# Patient Record
Sex: Female | Born: 1990 | Race: Black or African American | Hispanic: No | Marital: Single | Smoking: Never smoker
Health system: Southern US, Community
[De-identification: ages and names within clinical notes are randomized; demographics above are authoritative.]

## PROBLEM LIST (undated history)

## (undated) ENCOUNTER — Inpatient Hospital Stay (HOSPITAL_COMMUNITY): Payer: Self-pay

## (undated) DIAGNOSIS — A599 Trichomoniasis, unspecified: Secondary | ICD-10-CM

## (undated) DIAGNOSIS — J40 Bronchitis, not specified as acute or chronic: Secondary | ICD-10-CM

## (undated) DIAGNOSIS — O09299 Supervision of pregnancy with other poor reproductive or obstetric history, unspecified trimester: Secondary | ICD-10-CM

## (undated) HISTORY — PX: THERAPEUTIC ABORTION: SHX798

## (undated) HISTORY — PX: NO PAST SURGERIES: SHX2092

---
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
## 2008-04-29 ENCOUNTER — Emergency Department (HOSPITAL_COMMUNITY): Admission: EM | Admit: 2008-04-29 | Discharge: 2008-04-29 | Payer: Self-pay | Admitting: Emergency Medicine

## 2008-08-04 ENCOUNTER — Emergency Department (HOSPITAL_COMMUNITY): Admission: EM | Admit: 2008-08-04 | Discharge: 2008-08-04 | Payer: Self-pay | Admitting: Family Medicine

## 2008-08-17 ENCOUNTER — Inpatient Hospital Stay (HOSPITAL_COMMUNITY): Admission: AD | Admit: 2008-08-17 | Discharge: 2008-08-17 | Payer: Self-pay | Admitting: Family Medicine

## 2008-09-15 ENCOUNTER — Emergency Department (HOSPITAL_COMMUNITY): Admission: EM | Admit: 2008-09-15 | Discharge: 2008-09-15 | Payer: Self-pay | Admitting: Emergency Medicine

## 2008-11-25 ENCOUNTER — Inpatient Hospital Stay (HOSPITAL_COMMUNITY): Admission: AD | Admit: 2008-11-25 | Discharge: 2008-11-27 | Payer: Self-pay | Admitting: Obstetrics & Gynecology

## 2008-11-25 ENCOUNTER — Ambulatory Visit: Payer: Self-pay | Admitting: Family

## 2008-11-26 ENCOUNTER — Encounter: Payer: Self-pay | Admitting: Obstetrics & Gynecology

## 2008-12-07 ENCOUNTER — Ambulatory Visit: Payer: Self-pay | Admitting: Family Medicine

## 2008-12-10 ENCOUNTER — Ambulatory Visit (HOSPITAL_COMMUNITY): Admission: RE | Admit: 2008-12-10 | Discharge: 2008-12-10 | Payer: Self-pay | Admitting: Family Medicine

## 2008-12-14 ENCOUNTER — Ambulatory Visit: Payer: Self-pay | Admitting: Physician Assistant

## 2008-12-14 ENCOUNTER — Encounter: Payer: Self-pay | Admitting: Obstetrics & Gynecology

## 2008-12-14 ENCOUNTER — Inpatient Hospital Stay (HOSPITAL_COMMUNITY): Admission: AD | Admit: 2008-12-14 | Discharge: 2008-12-16 | Payer: Self-pay | Admitting: Obstetrics & Gynecology

## 2008-12-14 ENCOUNTER — Ambulatory Visit: Payer: Self-pay | Admitting: Obstetrics & Gynecology

## 2008-12-28 ENCOUNTER — Ambulatory Visit: Payer: Self-pay | Admitting: Obstetrics & Gynecology

## 2008-12-29 ENCOUNTER — Ambulatory Visit (HOSPITAL_COMMUNITY): Admission: RE | Admit: 2008-12-29 | Discharge: 2008-12-29 | Payer: Self-pay | Admitting: Family Medicine

## 2009-01-01 ENCOUNTER — Inpatient Hospital Stay (HOSPITAL_COMMUNITY): Admission: AD | Admit: 2009-01-01 | Discharge: 2009-01-01 | Payer: Self-pay | Admitting: Obstetrics & Gynecology

## 2009-01-04 ENCOUNTER — Ambulatory Visit: Payer: Self-pay | Admitting: Obstetrics & Gynecology

## 2009-01-18 ENCOUNTER — Ambulatory Visit: Payer: Self-pay | Admitting: Obstetrics & Gynecology

## 2009-01-18 LAB — CONVERTED CEMR LAB
Chlamydia, DNA Probe: NEGATIVE
GC Probe Amp, Genital: NEGATIVE

## 2009-01-24 ENCOUNTER — Ambulatory Visit: Payer: Self-pay | Admitting: Obstetrics and Gynecology

## 2009-01-24 ENCOUNTER — Inpatient Hospital Stay (HOSPITAL_COMMUNITY): Admission: AD | Admit: 2009-01-24 | Discharge: 2009-01-24 | Payer: Self-pay | Admitting: Obstetrics & Gynecology

## 2009-02-01 ENCOUNTER — Ambulatory Visit: Payer: Self-pay | Admitting: Family Medicine

## 2009-02-08 ENCOUNTER — Ambulatory Visit: Payer: Self-pay | Admitting: Obstetrics & Gynecology

## 2009-02-15 ENCOUNTER — Ambulatory Visit: Payer: Self-pay | Admitting: Obstetrics & Gynecology

## 2009-02-17 ENCOUNTER — Ambulatory Visit: Payer: Self-pay | Admitting: Family

## 2009-02-17 ENCOUNTER — Inpatient Hospital Stay (HOSPITAL_COMMUNITY): Admission: AD | Admit: 2009-02-17 | Discharge: 2009-02-21 | Payer: Self-pay | Admitting: Obstetrics and Gynecology

## 2009-04-08 ENCOUNTER — Ambulatory Visit: Payer: Self-pay | Admitting: Obstetrics and Gynecology

## 2009-04-20 ENCOUNTER — Emergency Department (HOSPITAL_COMMUNITY): Admission: EM | Admit: 2009-04-20 | Discharge: 2009-04-21 | Payer: Self-pay | Admitting: Emergency Medicine

## 2009-10-31 ENCOUNTER — Ambulatory Visit: Payer: Self-pay | Admitting: Obstetrics and Gynecology

## 2009-10-31 ENCOUNTER — Inpatient Hospital Stay (HOSPITAL_COMMUNITY): Admission: AD | Admit: 2009-10-31 | Discharge: 2009-10-31 | Payer: Self-pay | Admitting: Obstetrics and Gynecology

## 2009-12-25 ENCOUNTER — Emergency Department (HOSPITAL_COMMUNITY): Admission: EM | Admit: 2009-12-25 | Discharge: 2009-12-25 | Payer: Self-pay | Admitting: Emergency Medicine

## 2010-01-10 ENCOUNTER — Inpatient Hospital Stay (HOSPITAL_COMMUNITY): Admission: AD | Admit: 2010-01-10 | Discharge: 2010-01-10 | Payer: Self-pay | Admitting: Obstetrics and Gynecology

## 2010-02-09 ENCOUNTER — Ambulatory Visit: Payer: Self-pay | Admitting: Obstetrics and Gynecology

## 2010-02-09 LAB — CONVERTED CEMR LAB
Antibody Screen: NEGATIVE
Basophils Absolute: 0 10*3/uL (ref 0.0–0.1)
Basophils Relative: 0 % (ref 0–1)
Chlamydia, DNA Probe: NEGATIVE
Eosinophils Absolute: 0.1 10*3/uL (ref 0.0–0.7)
Eosinophils Relative: 1 % (ref 0–5)
GC Probe Amp, Genital: NEGATIVE
HCT: 36.6 % (ref 36.0–46.0)
Hemoglobin: 12.3 g/dL (ref 12.0–15.0)
Hepatitis B Surface Ag: NEGATIVE
Hgb A2 Quant: 2.6 % (ref 2.2–3.2)
Hgb A: 97.4 % (ref 96.8–97.8)
Hgb F Quant: 0 % (ref 0.0–2.0)
Hgb S Quant: 0 % (ref 0.0–0.0)
Lymphocytes Relative: 28 % (ref 12–46)
Lymphs Abs: 2 10*3/uL (ref 0.7–4.0)
MCHC: 33.6 g/dL (ref 30.0–36.0)
MCV: 79.7 fL (ref 78.0–100.0)
Monocytes Absolute: 0.5 10*3/uL (ref 0.1–1.0)
Monocytes Relative: 7 % (ref 3–12)
Neutro Abs: 4.7 10*3/uL (ref 1.7–7.7)
Neutrophils Relative %: 65 % (ref 43–77)
Platelets: 192 10*3/uL (ref 150–400)
RBC: 4.59 M/uL (ref 3.87–5.11)
RDW: 14 % (ref 11.5–15.5)
Rh Type: POSITIVE
Rubella: 44.2 intl units/mL — ABNORMAL HIGH
WBC: 7.3 10*3/uL (ref 4.0–10.5)

## 2010-02-15 ENCOUNTER — Ambulatory Visit (HOSPITAL_COMMUNITY): Admission: RE | Admit: 2010-02-15 | Discharge: 2010-02-15 | Payer: Self-pay | Admitting: Obstetrics & Gynecology

## 2010-02-23 ENCOUNTER — Ambulatory Visit (HOSPITAL_COMMUNITY): Admission: RE | Admit: 2010-02-23 | Discharge: 2010-02-23 | Payer: Self-pay | Admitting: Obstetrics & Gynecology

## 2010-03-09 ENCOUNTER — Ambulatory Visit: Payer: Self-pay | Admitting: Obstetrics and Gynecology

## 2010-03-30 ENCOUNTER — Ambulatory Visit (HOSPITAL_COMMUNITY): Admission: RE | Admit: 2010-03-30 | Discharge: 2010-03-30 | Payer: Self-pay | Admitting: Obstetrics & Gynecology

## 2010-04-06 ENCOUNTER — Ambulatory Visit: Payer: Self-pay | Admitting: Obstetrics and Gynecology

## 2010-05-18 ENCOUNTER — Ambulatory Visit: Payer: Self-pay | Admitting: Obstetrics and Gynecology

## 2010-05-25 ENCOUNTER — Ambulatory Visit: Payer: Self-pay | Admitting: Obstetrics and Gynecology

## 2010-06-08 ENCOUNTER — Encounter (INDEPENDENT_AMBULATORY_CARE_PROVIDER_SITE_OTHER): Payer: Self-pay | Admitting: Family Medicine

## 2010-06-08 ENCOUNTER — Ambulatory Visit: Payer: Self-pay | Admitting: Obstetrics and Gynecology

## 2010-06-08 LAB — CONVERTED CEMR LAB
HCT: 33.4 % — ABNORMAL LOW (ref 36.0–46.0)
Hemoglobin: 11.2 g/dL — ABNORMAL LOW (ref 12.0–15.0)
MCHC: 33.5 g/dL (ref 30.0–36.0)
MCV: 78.2 fL (ref 78.0–100.0)
Platelets: 207 10*3/uL (ref 150–400)
RBC: 4.27 M/uL (ref 3.87–5.11)
RDW: 13.7 % (ref 11.5–15.5)
WBC: 8.2 10*3/uL (ref 4.0–10.5)

## 2010-06-22 ENCOUNTER — Ambulatory Visit: Payer: Self-pay | Admitting: Obstetrics and Gynecology

## 2010-06-22 ENCOUNTER — Encounter (INDEPENDENT_AMBULATORY_CARE_PROVIDER_SITE_OTHER): Payer: Self-pay | Admitting: Family Medicine

## 2010-07-06 ENCOUNTER — Ambulatory Visit: Payer: Self-pay | Admitting: Obstetrics and Gynecology

## 2010-07-13 ENCOUNTER — Ambulatory Visit: Payer: Self-pay | Admitting: Obstetrics and Gynecology

## 2010-07-13 ENCOUNTER — Encounter: Payer: Self-pay | Admitting: Physician Assistant

## 2010-07-14 ENCOUNTER — Encounter: Payer: Self-pay | Admitting: Physician Assistant

## 2010-07-14 LAB — CONVERTED CEMR LAB: Chlamydia, DNA Probe: NEGATIVE

## 2010-07-27 ENCOUNTER — Ambulatory Visit: Payer: Self-pay | Admitting: Obstetrics and Gynecology

## 2010-08-03 ENCOUNTER — Ambulatory Visit: Payer: Self-pay | Admitting: Obstetrics and Gynecology

## 2010-08-10 ENCOUNTER — Ambulatory Visit: Payer: Self-pay | Admitting: Obstetrics & Gynecology

## 2010-08-11 ENCOUNTER — Inpatient Hospital Stay (HOSPITAL_COMMUNITY): Admission: AD | Admit: 2010-08-11 | Discharge: 2010-08-14 | Payer: Self-pay | Admitting: Obstetrics & Gynecology

## 2010-08-11 ENCOUNTER — Ambulatory Visit: Payer: Self-pay | Admitting: Obstetrics & Gynecology

## 2010-11-27 ENCOUNTER — Encounter: Payer: Self-pay | Admitting: Obstetrics & Gynecology

## 2011-01-19 LAB — CBC
HCT: 38.5 % (ref 36.0–46.0)
Hemoglobin: 12.6 g/dL (ref 12.0–15.0)
MCH: 26.4 pg (ref 26.0–34.0)
MCHC: 32.9 g/dL (ref 30.0–36.0)
RBC: 4.8 MIL/uL (ref 3.87–5.11)

## 2011-01-19 LAB — POCT URINALYSIS DIPSTICK
Bilirubin Urine: NEGATIVE
Bilirubin Urine: NEGATIVE
Glucose, UA: NEGATIVE mg/dL
Hgb urine dipstick: NEGATIVE
Hgb urine dipstick: NEGATIVE
Hgb urine dipstick: NEGATIVE
Ketones, ur: NEGATIVE mg/dL
Ketones, ur: NEGATIVE mg/dL
Nitrite: NEGATIVE
Protein, ur: NEGATIVE mg/dL
Specific Gravity, Urine: 1.02 (ref 1.005–1.030)
Specific Gravity, Urine: 1.02 (ref 1.005–1.030)
Urobilinogen, UA: 1 mg/dL (ref 0.0–1.0)
pH: 5.5 (ref 5.0–8.0)
pH: 6.5 (ref 5.0–8.0)

## 2011-01-20 LAB — POCT URINALYSIS DIPSTICK
Bilirubin Urine: NEGATIVE
Bilirubin Urine: NEGATIVE
Glucose, UA: NEGATIVE mg/dL
Hgb urine dipstick: NEGATIVE
Hgb urine dipstick: NEGATIVE
Ketones, ur: NEGATIVE mg/dL
Ketones, ur: NEGATIVE mg/dL
Nitrite: NEGATIVE
Protein, ur: NEGATIVE mg/dL
Protein, ur: NEGATIVE mg/dL
Specific Gravity, Urine: <=1.005 (ref 1.005–1.030)
Specific Gravity, Urine: <=1.005 (ref 1.005–1.030)
Urobilinogen, UA: 0.2 mg/dL (ref 0.0–1.0)
pH: 5.5 (ref 5.0–8.0)
pH: 6.5 (ref 5.0–8.0)

## 2011-01-21 LAB — POCT URINALYSIS DIP (DEVICE)
Bilirubin Urine: NEGATIVE
Ketones, ur: NEGATIVE mg/dL
pH: 5 (ref 5.0–8.0)

## 2011-01-22 LAB — POCT URINALYSIS DIP (DEVICE)
Bilirubin Urine: NEGATIVE
Glucose, UA: NEGATIVE mg/dL
Ketones, ur: NEGATIVE mg/dL
Protein, ur: 30 mg/dL — AB
Specific Gravity, Urine: 1.025 (ref 1.005–1.030)

## 2011-01-23 LAB — POCT URINALYSIS DIP (DEVICE)
Glucose, UA: NEGATIVE mg/dL
Hgb urine dipstick: NEGATIVE
Ketones, ur: NEGATIVE mg/dL
Specific Gravity, Urine: >=1.03 (ref 1.005–1.030)

## 2011-01-24 LAB — POCT URINALYSIS DIP (DEVICE)
Bilirubin Urine: NEGATIVE
Hgb urine dipstick: NEGATIVE
Ketones, ur: NEGATIVE mg/dL
Specific Gravity, Urine: 1.025 (ref 1.005–1.030)
pH: 6.5 (ref 5.0–8.0)

## 2011-01-25 LAB — POCT I-STAT, CHEM 8
Calcium, Ion: 1.16 mmol/L (ref 1.12–1.32)
Chloride: 105 mEq/L (ref 96–112)
Glucose, Bld: 88 mg/dL (ref 70–99)
HCT: 38 % (ref 36.0–46.0)
Hemoglobin: 12.9 g/dL (ref 12.0–15.0)
Potassium: 3.7 mEq/L (ref 3.5–5.1)

## 2011-01-25 LAB — URINALYSIS, ROUTINE W REFLEX MICROSCOPIC
Bilirubin Urine: NEGATIVE
Hgb urine dipstick: NEGATIVE
Specific Gravity, Urine: 1.023 (ref 1.005–1.030)
pH: 7 (ref 5.0–8.0)

## 2011-01-25 LAB — POCT URINALYSIS DIP (DEVICE)
Glucose, UA: NEGATIVE mg/dL
Protein, ur: NEGATIVE mg/dL
Specific Gravity, Urine: >1.03 — ABNORMAL HIGH (ref 1.005–1.030)

## 2011-01-25 LAB — POCT PREGNANCY, URINE: Preg Test, Ur: POSITIVE

## 2011-01-25 LAB — URINE MICROSCOPIC-ADD ON

## 2011-01-29 LAB — URINALYSIS, ROUTINE W REFLEX MICROSCOPIC
Bilirubin Urine: NEGATIVE
Glucose, UA: NEGATIVE mg/dL
Hgb urine dipstick: NEGATIVE
Specific Gravity, Urine: 1.02 (ref 1.005–1.030)
pH: 6.5 (ref 5.0–8.0)

## 2011-01-29 LAB — GC/CHLAMYDIA PROBE AMP, GENITAL
Chlamydia, DNA Probe: NEGATIVE
GC Probe Amp, Genital: POSITIVE — AB

## 2011-01-29 LAB — WET PREP, GENITAL: Trich, Wet Prep: NONE SEEN

## 2011-02-13 LAB — URINE CULTURE: Colony Count: >=100000

## 2011-02-13 LAB — URINALYSIS, ROUTINE W REFLEX MICROSCOPIC
Bilirubin Urine: NEGATIVE
Protein, ur: 100 mg/dL — AB
Urobilinogen, UA: 1 mg/dL (ref 0.0–1.0)

## 2011-02-13 LAB — URINE MICROSCOPIC-ADD ON

## 2011-02-15 LAB — POCT URINALYSIS DIP (DEVICE)
Bilirubin Urine: NEGATIVE
Glucose, UA: NEGATIVE mg/dL
Hgb urine dipstick: NEGATIVE
Hgb urine dipstick: NEGATIVE
Ketones, ur: NEGATIVE mg/dL
Nitrite: NEGATIVE
Specific Gravity, Urine: 1.01 (ref 1.005–1.030)
Specific Gravity, Urine: 1.01 (ref 1.005–1.030)
Urobilinogen, UA: 0.2 mg/dL (ref 0.0–1.0)
pH: 6.5 (ref 5.0–8.0)

## 2011-02-15 LAB — COMPREHENSIVE METABOLIC PANEL
AST: 24 U/L (ref 0–37)
Albumin: 2.9 g/dL — ABNORMAL LOW (ref 3.5–5.2)
BUN: 3 mg/dL — ABNORMAL LOW (ref 6–23)
Calcium: 9.1 mg/dL (ref 8.4–10.5)
Creatinine, Ser: 0.65 mg/dL (ref 0.4–1.2)
GFR calc Af Amer: >60 mL/min (ref >60)

## 2011-02-15 LAB — CBC
HCT: 36.5 % (ref 36.0–46.0)
HCT: 38.7 % (ref 36.0–46.0)
Hemoglobin: 12.3 g/dL (ref 12.0–15.0)
MCHC: 33 g/dL (ref 30.0–36.0)
MCHC: 33.8 g/dL (ref 30.0–36.0)
MCV: 81 fL (ref 78.0–100.0)
MCV: 83 fL (ref 78.0–100.0)
Platelets: 208 10*3/uL (ref 150–400)
RDW: 13.8 % (ref 11.5–15.5)
RDW: 13.8 % (ref 11.5–15.5)
WBC: 12.8 10*3/uL — ABNORMAL HIGH (ref 4.0–10.5)

## 2011-02-16 LAB — POCT URINALYSIS DIP (DEVICE)
Bilirubin Urine: NEGATIVE
Bilirubin Urine: NEGATIVE
Glucose, UA: NEGATIVE mg/dL
Glucose, UA: NEGATIVE mg/dL
Hgb urine dipstick: NEGATIVE
Hgb urine dipstick: NEGATIVE
Ketones, ur: NEGATIVE mg/dL
Ketones, ur: NEGATIVE mg/dL
Nitrite: NEGATIVE
Protein, ur: NEGATIVE mg/dL
Protein, ur: NEGATIVE mg/dL
Specific Gravity, Urine: 1.01 (ref 1.005–1.030)
Specific Gravity, Urine: 1.01 (ref 1.005–1.030)
Specific Gravity, Urine: <=1.005 (ref 1.005–1.030)
Urobilinogen, UA: 0.2 mg/dL (ref 0.0–1.0)
pH: 6 (ref 5.0–8.0)
pH: 6.5 (ref 5.0–8.0)

## 2011-02-20 LAB — URINALYSIS, ROUTINE W REFLEX MICROSCOPIC
Bilirubin Urine: NEGATIVE
Ketones, ur: 15 mg/dL — AB
Nitrite: NEGATIVE
Protein, ur: NEGATIVE mg/dL
pH: 6.5 (ref 5.0–8.0)

## 2011-02-20 LAB — CBC
Hemoglobin: 11.7 g/dL — ABNORMAL LOW (ref 12.0–15.0)
MCHC: 33.5 g/dL (ref 30.0–36.0)
MCV: 83.3 fL (ref 78.0–100.0)
RBC: 4.21 MIL/uL (ref 3.87–5.11)
RDW: 13.1 % (ref 11.5–15.5)

## 2011-02-20 LAB — DIFFERENTIAL
Basophils Absolute: 0 10*3/uL (ref 0.0–0.1)
Basophils Relative: 0 % (ref 0–1)
Eosinophils Absolute: 0.2 10*3/uL (ref 0.0–0.7)
Eosinophils Relative: 2 % (ref 0–5)
Monocytes Absolute: 0.5 10*3/uL (ref 0.1–1.0)
Monocytes Relative: 5 % (ref 3–12)

## 2011-02-20 LAB — RAPID URINE DRUG SCREEN, HOSP PERFORMED
Benzodiazepines: NOT DETECTED
Cocaine: NOT DETECTED
Tetrahydrocannabinol: NOT DETECTED

## 2011-02-20 LAB — SICKLE CELL SCREEN: Sickle Cell Screen: NEGATIVE

## 2011-02-20 LAB — WET PREP, GENITAL
Trich, Wet Prep: NONE SEEN
Yeast Wet Prep HPF POC: NONE SEEN

## 2011-02-20 LAB — HIV ANTIBODY (ROUTINE TESTING W REFLEX): HIV: NONREACTIVE

## 2011-02-20 LAB — TYPE AND SCREEN

## 2011-02-20 LAB — ABO/RH: ABO/RH(D): B POS

## 2011-02-21 LAB — POCT URINALYSIS DIP (DEVICE)
Bilirubin Urine: NEGATIVE
Bilirubin Urine: NEGATIVE
Glucose, UA: NEGATIVE mg/dL
Hgb urine dipstick: NEGATIVE
Hgb urine dipstick: NEGATIVE
Hgb urine dipstick: NEGATIVE
Ketones, ur: NEGATIVE mg/dL
Ketones, ur: NEGATIVE mg/dL
Protein, ur: NEGATIVE mg/dL
Protein, ur: NEGATIVE mg/dL
Specific Gravity, Urine: 1.01 (ref 1.005–1.030)
Specific Gravity, Urine: 1.015 (ref 1.005–1.030)
Specific Gravity, Urine: 1.015 (ref 1.005–1.030)
pH: 5.5 (ref 5.0–8.0)
pH: 6 (ref 5.0–8.0)
pH: 7 (ref 5.0–8.0)

## 2011-02-21 LAB — WET PREP, GENITAL: Clue Cells Wet Prep HPF POC: NONE SEEN

## 2011-02-21 LAB — FETAL FIBRONECTIN: Fetal Fibronectin: NEGATIVE

## 2011-02-21 LAB — STREP B DNA PROBE: Strep Group B Ag: NEGATIVE

## 2011-03-21 NOTE — Discharge Summary (Signed)
Amanda Murillo, Amanda Murillo                ACCOUNT NO.:  [id number]   MEDICAL RECORD NO.:  [id number]           PATIENT TYPE:   LOCATION:                                 FACILITY:   PHYSICIAN:  Norton Blizzard, MD    DATE OF BIRTH:  1991/01/13   DATE OF ADMISSION:  02/17/2009  DATE OF DISCHARGE:  02/21/2009                               DISCHARGE SUMMARY   ADMISSION DIAGNOSES:  1. Intrauterine pregnancy at 72 and 1/7 weeks' gestation.  2. Fetus with known Trisomy 18.  3. Induction of labor for post dates.   DISCHARGE DIAGNOSIS:  Status post vaginal delivery of nonviable trisomy  18 fetus.   PERTINENT LABORATORIES:  B positive.  Antibody negative.  Rubella  immune.  Hepatitis B surface antigen negative.  Syphilis nonreactive.  HIV nonreactive.  GC negative.  Chlamydia negative.  Sickle cell screen  negative.  One-hour GTT was 124.  GBS negative.  Post-delivery  hemoglobin 12.8, hematocrit of 38.7, platelet count 208.   BRIEF HOSPITAL COURSE:  The patient is a 20 year old gravida 1, para 0  who presented at 102 and 1/7 weeks' gestation for induction of labor for  post dates in the setting of a known Trisomy 18 fetus.  The patient's  diagnosis was made via amniocentesis and subsequent FISH analysis in  late second trimester.  She was followed in the [facility] and was  extensively counseled about the prognosis of having a fetus with Trisomy  42.  The patient did not go into spontaneous labor, and was induced at  postdates as per protocol.  Her induction proceeded with Cytotec, Foley  bulb, and Pitocin, and the patient had no complications during her  induction of labor.  Of note, there was no fetal monitoring given the  Trisomy 18 diagnosis.  She was seen by a Child psychotherapist and a Orthoptist  during her induction.  The patient went on to have a delivery of a  nonviable fetus on April 16 at  0121.  Female infant, Apgars of 0 and 0,  and weight was 1975 g.  Estimated blood loss was about 350  mL, and the  placenta was noted to be calcified.  After delivery, the patient was  initially noted to be grieving appropriately, but then she was  persistent in having to see her nonviable infant over and over again.  She was also increasingly withdrawn and depressed.  The patient was  started on Zoloft given that she was exhibiting signs of postpartum  depression.  She was also started on Xanax for anxiety.  An arrangement  was made with the morgue that she will be able to come back into the  hospital just to see the infant.  The patient did make plans with the  help of the social work consult for funeral plans for the fetus.  At the  time of discharge, the patient was physically okay: her bleeding is  minimal, abdomen is soft with a firm fundus, extremities are nontender,  but she is having a hard time dealing with her fetal demise.   DISCHARGE  CONDITION:  Good.   DISCHARGE MEDICATIONS:  1. Ibuprofen 600 mg p.o. q.6 h. p.r.n. pain.  2. Xanax 0.25 mg p.o. t.i.d. p.r.n. anxiety.  3. Zoloft 50 mg p.o. daily.   DISCHARGE INSTRUCTIONS:  The patient was given the routine postpartum  discharge instructions and told to come to the [facility] for any concerns about heavy bleeding,  infection, worsening depression, suicidal/homicidal ideation or any  other concerns.   FOLLOWUP APPOINTMENTS:  The patient will be seen in the [facility] in the next 1-2 weeks given her circumstances and high risk for  continued postpartum depression.  If she is stable at this visit, she  will follow up in the health department for her routine postpartum check  at 6 weeks.      Norton Blizzard, MD  Electronically Signed     UAD/MEDQ  D:  02/21/2009  T:  02/21/2009  Job:  [id number]

## 2011-03-21 NOTE — Discharge Summary (Signed)
Amanda Murillo, Amanda Murillo                ACCOUNT NO.:  [id number]   MEDICAL RECORD NO.:  [id number]          PATIENT TYPE:  INP   LOCATION:  9158                          FACILITY:  [facility]   PHYSICIAN:  Scheryl Darter, MD       DATE OF BIRTH:  07-01-1991   DATE OF ADMISSION:  11/25/2008  DATE OF DISCHARGE:  11/27/2008                               DISCHARGE SUMMARY   DIAGNOSES:  1. Asthma exacerbation.  2. Intrauterine pregnancy [redacted] weeks gestation.   The patient is an 20 year old gravida 1, para 0, last menstrual period  May 05, 2008, estimated date of confinement February 09, 2009, who  presented at 29 weeks 1 day gestation with shortness of breath.  The  patient has had a history of asthma.  Her medications are albuterol  p.r.n.  She had a cough with sputum production.   PAST MEDICAL HISTORY:  Asthma.   MEDICATIONS:  Albuterol inhaler.   ALLERGIES:  No known drug allergies.   SOCIAL HISTORY:  The patient denies alcohol, tobacco, or drug use.  She  had no prenatal care.   PHYSICAL EXAMINATION:  VITAL SIGNS:  Height is 59 inches, weight 153  pounds, BP 121/95, temperature 97.3, pulse 88, respirations 20, and O2  sat was 98%.  GENERAL:  The patient in no acute distress.  HEART:  Regular rate and rhythm.  RESPIRATIONS:  She has labored respirations and wheezes are noted, which  are improved after 1 nebulizer treatment.  ABDOMEN:  Gravid and large for date.  She has no edema.  Fetal heart  rate was 135 with variability and no traction.   An ultrasound showed intrauterine pregnancy.  There was increased  aminotic fluid index of 31.27 and measurements consistent with 29 weeks'  and 2 days' gestation.  No fetal anomalies were seen.  She received Solu-  Medrol 80 mg IV q.6 h., which was changed to p.o. prednisone.  She  received albuterol nebulizer treatment.  On November 27, 2008, the  breathing was much improved.  She was discharged home with prednisone  taper for 10 days and prescription  for Advair inhaler twice a day.  She  receives her albuterol inhaler p.r.n.  She is to follow up in [facility] a week after discharge and she is to have a repeat ultrasound and  possibly amniocentesis in 2 weeks.      Scheryl Darter, MD     JA/MEDQ  D:  12/11/2008  T:  12/12/2008  Job:  [id number]

## 2011-03-21 NOTE — Group Therapy Note (Signed)
NAMEELLYSSA, ZAGAL                ACCOUNT NO.:  [id number]   MEDICAL RECORD NO.:  [id number]          PATIENT TYPE:  WOC   LOCATION:  [facility]                   [facility]AC[facility]  [facility]   PHYSICIAN:  Sid Falcon, CNM  DATE OF BIRTH:  [birth year]   DATE OF SERVICE:                                  CLINIC NOTE   SUBJECTIVE:  Six-week postpartum followup; however, there was a note  stating postpartum depression.  When asked, the patient denies  postpartum depression.  No complaints of sadness, no thoughts of  suicide, no thoughts of harming others.  Stated it was reported by the  [facility] nurse that she should be evaluated for postpartum depression.  Had a trisomy 76 birth in April.  Reports dealing with grief  appropriately and declines any counseling help.  Does desire NuvaRing  for birth control method.  Denies sexual activity since November 2009.  No other questions or concerns.  Last menstrual period on Apr 02, 2009.   PHYSICAL EXAMINATION:  The patient is alert and oriented x3.  No  physical exam done.   ASSESSMENT:  A 6-week postpartum exam.   PLAN:  NuvaRing prescription.  Described the use, insert in vagina,  remove and replace with a new after menses.  The patient will follow up  if experiences signs of depression.  Pap smear not indicated due to the  patient's age.      Sid Falcon, CNM     WM/MEDQ  D:  04/08/2009  T:  04/08/2009  Job:  [id number]

## 2011-03-21 NOTE — Discharge Summary (Signed)
Amanda Murillo, Amanda Murillo                ACCOUNT NO.:  [id number]   MEDICAL RECORD NO.:  [id number]          PATIENT TYPE:  INP   LOCATION:  9159                          [facility]ACILITY:  [facility]   PHYSICIAN:  Lazaro Arms, M.D.   DATE OF BIRTH:  09/05/1991   DATE OF ADMISSION:  12/14/2008  DATE OF DISCHARGE:  12/16/2008                               DISCHARGE SUMMARY    The patient was admitted on December 14, 2008, at 31 weeks 6 days with  preterm contractions.  The patient was discharged on December 16, 2008,  with the discharge diagnosis of intrauterine pregnancy at 32 weeks 1/7  days with known fetal anomaly and FISH positive for trisomy 25.  At the  time of admission, the patient was found to present to Maternity  Admission and was found to have preterm contractions.  She was admitted  for observation, IV hydration, and steroid betamethasone injections for  fetal lung maturity and nifedipine.  During the course of her hospital  stay, an ultrasound was scheduled and performed that revealed  abnormalities that were consistent with lethal diagnosis.  The patient  had a consult with Maternal Fetal Medicine during the course of her  hospital stay and amniocentesis was performed.  Amniocentesis results  were called to the attending for the faculty practice to confirm lethal  diagnosis of tri 18.  A decision was made to discontinue the Procardia  for preterm labor given her diagnosis of asthma and also a new diagnosis  of lethal fetal anomaly.  Multiple discussions were had with the family,  with the attending, midwife, and staff including chaplain, Maternal  Fetal Medicine concerning this diagnosis and options for continuation of  pregnancy and referral for Neonatal Palliative Care and Hospice.  On the  evening of February 10, the patient and her family did make a decision  that she should decide to go home.  She was in stable status.  Dr. Despina Hidden  again discussed the diagnosis of tri 18 and options at  length with the  patient and her family.  She was discharged home in stable status with  instructions to follow up in [facility] the following Monday.  She  was given a prescription for Ativan p.r.n. anxiety.  She was given labor  precautions and instructions to continue all home meds as prescribed for  her asthma.       Maylon Cos, C.N.M.      Lazaro Arms, M.D.  Electronically Signed    SS/MEDQ  D:  03/04/2009  T:  03/04/2009  Job:  [id number]

## 2011-05-02 ENCOUNTER — Emergency Department (HOSPITAL_COMMUNITY): Payer: Medicaid Other

## 2011-05-02 ENCOUNTER — Emergency Department (HOSPITAL_COMMUNITY)
Admission: EM | Admit: 2011-05-02 | Discharge: 2011-05-02 | Disposition: A | Discharge status: Home / Self Care | Payer: Medicaid Other | Attending: Emergency Medicine | Admitting: Emergency Medicine

## 2011-05-02 DIAGNOSIS — R0989 Other specified symptoms and signs involving the circulatory and respiratory systems: Secondary | ICD-10-CM | POA: Insufficient documentation

## 2011-05-02 DIAGNOSIS — R059 Cough, unspecified: Secondary | ICD-10-CM | POA: Insufficient documentation

## 2011-05-02 DIAGNOSIS — R0609 Other forms of dyspnea: Secondary | ICD-10-CM | POA: Insufficient documentation

## 2011-05-02 DIAGNOSIS — R05 Cough: Secondary | ICD-10-CM | POA: Insufficient documentation

## 2011-05-02 DIAGNOSIS — R079 Chest pain, unspecified: Secondary | ICD-10-CM | POA: Insufficient documentation

## 2011-05-02 DIAGNOSIS — R000 Tachycardia, unspecified: Secondary | ICD-10-CM | POA: Insufficient documentation

## 2011-05-02 DIAGNOSIS — J45901 Unspecified asthma with (acute) exacerbation: Secondary | ICD-10-CM | POA: Insufficient documentation

## 2011-08-01 ENCOUNTER — Emergency Department (HOSPITAL_COMMUNITY)
Admission: EM | Admit: 2011-08-01 | Discharge: 2011-08-01 | Disposition: A | Discharge status: Home / Self Care | Payer: Medicaid Other | Attending: Emergency Medicine | Admitting: Emergency Medicine

## 2011-08-01 DIAGNOSIS — Z79899 Other long term (current) drug therapy: Secondary | ICD-10-CM | POA: Insufficient documentation

## 2011-08-01 DIAGNOSIS — J45909 Unspecified asthma, uncomplicated: Secondary | ICD-10-CM | POA: Insufficient documentation

## 2011-08-01 LAB — POCT PREGNANCY, URINE: Preg Test, Ur: POSITIVE

## 2011-08-07 LAB — WET PREP, GENITAL
Trich, Wet Prep: NONE SEEN
Yeast Wet Prep HPF POC: NONE SEEN

## 2011-08-07 LAB — GC/CHLAMYDIA PROBE AMP, GENITAL: Chlamydia, DNA Probe: NEGATIVE

## 2011-08-07 LAB — POCT URINALYSIS DIP (DEVICE)
Ketones, ur: 15 — AB
Protein, ur: NEGATIVE
pH: 6

## 2011-08-08 LAB — URINE MICROSCOPIC-ADD ON

## 2011-08-08 LAB — URINALYSIS, ROUTINE W REFLEX MICROSCOPIC
Bilirubin Urine: NEGATIVE
Glucose, UA: NEGATIVE
Ketones, ur: 15 — AB
Nitrite: NEGATIVE
Nitrite: NEGATIVE
Specific Gravity, Urine: 1.012
pH: 6
pH: 7

## 2011-08-22 ENCOUNTER — Inpatient Hospital Stay (HOSPITAL_COMMUNITY): Payer: Medicaid Other

## 2011-08-22 ENCOUNTER — Encounter (HOSPITAL_COMMUNITY): Payer: Self-pay

## 2011-08-22 ENCOUNTER — Inpatient Hospital Stay (HOSPITAL_COMMUNITY)
Admission: AD | Admit: 2011-08-22 | Discharge: 2011-08-22 | Disposition: A | Discharge status: Home / Self Care | Payer: Medicaid Other | Source: Ambulatory Visit | Attending: Obstetrics & Gynecology | Admitting: Obstetrics & Gynecology

## 2011-08-22 DIAGNOSIS — O26899 Other specified pregnancy related conditions, unspecified trimester: Secondary | ICD-10-CM

## 2011-08-22 DIAGNOSIS — O99891 Other specified diseases and conditions complicating pregnancy: Secondary | ICD-10-CM | POA: Insufficient documentation

## 2011-08-22 DIAGNOSIS — R109 Unspecified abdominal pain: Secondary | ICD-10-CM | POA: Insufficient documentation

## 2011-08-22 LAB — POCT PREGNANCY, URINE: Preg Test, Ur: POSITIVE

## 2011-08-22 NOTE — Progress Notes (Signed)
Pt states lmp-mid June, +FHT's heard, pt states white thin vaginal d/c, non-odorous, denies bleeding. No pain at present. Was here to confirm pregnancy.

## 2011-08-22 NOTE — ED Provider Notes (Signed)
History   Pt presents today for pregnancy confirmation. She states she is uncertain of how far along she is. She states she had some abd pain earlier, but none now. She denies vag dc, bleeding, or any other sx at this time.  Chief Complaint  Patient presents with  . Vaginal Discharge   HPI  OB History    Grav Para Term Preterm Abortions TAB SAB Ect Mult Living   3 2 2  0  0 0 0 0 1      Past Medical History  Diagnosis Date  . Asthma     History reviewed. No pertinent past surgical history.  No family history on file.  History  Substance Use Topics  . Smoking status: Never Smoker   . Smokeless tobacco: Never Used  . Alcohol Use: No    Allergies: Allergies not on file  No prescriptions prior to admission    Review of Systems  Constitutional: Negative for fever.  Cardiovascular: Negative for chest pain.  Gastrointestinal: Negative for nausea, vomiting, abdominal pain, diarrhea and constipation.  Genitourinary: Negative for dysuria, urgency, frequency and hematuria.  Neurological: Negative for dizziness and headaches.  Psychiatric/Behavioral: Negative for depression and suicidal ideas.   Physical Exam   Blood pressure 116/68, pulse 85, temperature 97.7 F (36.5 C), temperature source Oral, resp. rate 16, height 5' (1.524 m), weight 164 lb 6 oz (74.56 kg), last menstrual period 04/21/2011.  Physical Exam  Nursing note and vitals reviewed. Constitutional: She is oriented to person, place, and time. She appears well-developed and well-nourished. No distress.  HENT:  Head: Normocephalic and atraumatic.  Eyes: EOM are normal. Pupils are equal, round, and reactive to light.  GI: Soft. She exhibits no distension. There is no tenderness. There is no rebound and no guarding.  Neurological: She is alert and oriented to person, place, and time.  Skin: Skin is warm and dry. She is not diaphoretic.  Psychiatric: She has a normal mood and affect. Her behavior is normal.  Judgment and thought content normal.    MAU Course  Procedures  US shows single IUP at 22.6wks and NL cervical length. [facility] 12/20/11.  Assessment and Plan  Preg: discussed with pt at length. Discussed diet, activity, risks, and precautions.  Clinton Gallant. Jasenia Weilbacher III, DrHSc, MPAS, PA-C  08/22/2011, 1:27 PM   Henrietta Hoover, PA 08/22/11 1502

## 2011-08-24 NOTE — ED Provider Notes (Signed)
Attestation of Attending Supervision of Advanced Practitioner: Evaluation and management procedures were performed by the PA/NP/CNM/OB Fellow under my supervision/collaboration. Chart reviewed and agree with management and plan.  Samyrah Bruster A 08/24/2011 2:49 PM   

## 2011-10-07 ENCOUNTER — Encounter (HOSPITAL_COMMUNITY): Payer: Self-pay | Admitting: *Deleted

## 2011-10-07 ENCOUNTER — Inpatient Hospital Stay (HOSPITAL_COMMUNITY)
Admission: AD | Admit: 2011-10-07 | Discharge: 2011-10-07 | Disposition: A | Discharge status: Home / Self Care | Payer: Medicaid Other | Source: Ambulatory Visit | Attending: Obstetrics and Gynecology | Admitting: Obstetrics and Gynecology

## 2011-10-07 DIAGNOSIS — R109 Unspecified abdominal pain: Secondary | ICD-10-CM | POA: Insufficient documentation

## 2011-10-07 DIAGNOSIS — M5430 Sciatica, unspecified side: Secondary | ICD-10-CM

## 2011-10-07 DIAGNOSIS — M545 Low back pain, unspecified: Secondary | ICD-10-CM | POA: Insufficient documentation

## 2011-10-07 DIAGNOSIS — O99891 Other specified diseases and conditions complicating pregnancy: Secondary | ICD-10-CM | POA: Insufficient documentation

## 2011-10-07 HISTORY — DX: Trichomoniasis, unspecified: A59.9

## 2011-10-07 HISTORY — DX: Supervision of pregnancy with other poor reproductive or obstetric history, unspecified trimester: O09.299

## 2011-10-07 LAB — URINALYSIS, ROUTINE W REFLEX MICROSCOPIC
Bilirubin Urine: NEGATIVE
Glucose, UA: NEGATIVE mg/dL
Ketones, ur: 15 mg/dL — AB
Protein, ur: NEGATIVE mg/dL

## 2011-10-07 LAB — URINE MICROSCOPIC-ADD ON

## 2011-10-07 MED ORDER — ACETAMINOPHEN 500 MG PO TABS: 500.0000 mg | ORAL_TABLET | Freq: Four times a day (QID) | ORAL | Status: AC | PRN

## 2011-10-07 NOTE — ED Provider Notes (Signed)
Supervised by me. Agree with assessment. Will refer to HR clinic

## 2011-10-07 NOTE — Progress Notes (Signed)
Patient states she has had no prenatal care. Has been trying to get into the [facility] because she had a previous full term stillbirth with trisomy 22 with her first pregnancy. Has been having abdominal and leg pain for "months". No leaking or bleeding and reports good fetal movement.

## 2011-10-07 NOTE — ED Provider Notes (Signed)
History     Chief Complaint  Patient presents with  . Abdominal Pain   HPI  Patient presents to MAU with CC: "legs giving out."  She states this has been going on for a few months.  Describes it as a shooting/burning sensation that starts at her hips and radiates to her thighs and knees.  Pain is moderate.  At times, she has to hold on to something so she does not fall.  Denies any falls.  Pain is constant throughout the day - worse at night when she tries to lay on her back to sleep.  Has not tried any OTC medications or heating pads at home.  Patient does not have an OB provider - she has not had any prenatal care for this current pregnancy.  She is waiting on Medicaid approval and a referral to [facility].  OB History    Grav Para Term Preterm Abortions TAB SAB Ect Mult Living   3 2 2  0  0 0 0 0 1      Past Medical History  Diagnosis Date  . Asthma   . 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 Trichomonas infection     Past Surgical History  Procedure Date  . No past surgeries     History reviewed. No pertinent family history.  History  Substance Use Topics  . Smoking status: Never Smoker   . Smokeless tobacco: Never Used  . Alcohol Use: No    Allergies: No Known Allergies  Prescriptions prior to admission  Medication Sig Dispense Refill  . prenatal vitamin w/FE, FA (PRENATAL 1 + 1) 27-1 MG TABS Take 1 tablet by mouth daily.        . ALBUTEROL SULFATE HFA IN Inhale 2 puffs into the lungs daily as needed. For shortness of breath.         ROS  Denies fever, chills, sweats, urinary or bowel incontinence.  Denies any rash or signs of abscess of infection.  Able to ambulate.  Physical Exam   Blood pressure 115/67, pulse 76, temperature 98.4 F (36.9 C), temperature source Oral, resp. rate 20, height 5' (1.524 m), weight 73.301 kg (161 lb 9.6 oz), last menstrual period 04/21/2011, SpO2 98.00%, unknown if currently breastfeeding.  Physical Exam    Constitutional: She is oriented to person, place, and time. No distress.  HENT:  Head: Normocephalic and atraumatic.  Neck: Normal range of motion. Neck supple.  Cardiovascular: Normal rate, regular rhythm, normal heart sounds and intact distal pulses.  Exam reveals no gallop and no friction rub.   No murmur heard. Respiratory: Effort normal and breath sounds normal. She has no wheezes. She has no rales. She exhibits no tenderness.  GI: Soft. Bowel sounds are normal. She exhibits no distension. There is no tenderness.       Gravid  Musculoskeletal: Normal range of motion. She exhibits no edema and no tenderness.  Neurological: She is alert and oriented to person, place, and time. No cranial nerve deficit or sensory deficit. Coordination normal.       4/4 strength bilateral lower extremities and 4/4 strength upper extremities, possibly secondary to poor effort  Skin: Skin is dry.       Dry, round papular lesions on bilateral arms  Psychiatric: She has a normal mood and affect.    MAU Course  Procedures NST  Assessment and Plan  1.  Lower extremity pain: likely secondary to sciatica vs. Low back pain in  pregnancy.  Discussed treatment options with patient.  Will give Rx Tylenol 500 mg PRN pain.  Will give handout with home care instructions.  Recommended RICE for 2-3 days, heating pads to affected areas, followed by increased physical activity and exercise. 2. Supervision of pregnancy:  Patient needs prenatal care especially with a hx of trisomy 18 in previous pregnancy.  Will send a note to Admin team to schedule appointment at [facility] as soon as possible.  FHT were reassuring in MAU.  Patient denied any vaginal bleeding, discharge, or decreased fetal movement.  Kick counts and preterm labor precautions reviewed. 3. Reassuring fetal heart tones 4. Disposition: Will discharge home today with follow up at [facility]  DE LA CRUZ,Stevens Magwood 10/07/2011, 5:44 PM

## 2011-11-07 NOTE — L&D Delivery Note (Cosign Needed)
Delivery Note At 12:28 AM a viable female was delivered via Vaginal, Spontaneous Delivery (Presentation: Right Occiput Posterior) with loose nuchal cord x1 reduced without difficulty.  APGAR:8 ,9; weight 5 lb 14 oz (2665 g).   Placenta status: Intact, Spontaneous.  Cord: 3 vessels with the following complications: None.  Cord pH: not indicated.  Anesthesia: Epidural  Episiotomy: None Lacerations: None Suture Repair: n/a Est. Blood Loss (mL): 250 ml  Mom to postpartum.  Baby to nursery-stable.  LEFTWICH-KIRBY, Latori Beggs 12/14/2011, 12:46 AM

## 2011-12-06 ENCOUNTER — Ambulatory Visit (INDEPENDENT_AMBULATORY_CARE_PROVIDER_SITE_OTHER): Payer: Medicaid Other | Admitting: Advanced Practice Midwife

## 2011-12-06 VITALS — BP 117/76 | Temp 97.1°F | Wt 164.5 lb

## 2011-12-06 DIAGNOSIS — O0930 Supervision of pregnancy with insufficient antenatal care, unspecified trimester: Secondary | ICD-10-CM | POA: Insufficient documentation

## 2011-12-06 DIAGNOSIS — O09299 Supervision of pregnancy with other poor reproductive or obstetric history, unspecified trimester: Secondary | ICD-10-CM | POA: Insufficient documentation

## 2011-12-06 DIAGNOSIS — O358XX0 Maternal care for other (suspected) fetal abnormality and damage, not applicable or unspecified: Secondary | ICD-10-CM

## 2011-12-06 DIAGNOSIS — O09629 Supervision of young multigravida, unspecified trimester: Secondary | ICD-10-CM

## 2011-12-06 LAB — POCT URINALYSIS DIP (DEVICE)
Hgb urine dipstick: NEGATIVE
Nitrite: NEGATIVE
Protein, ur: NEGATIVE mg/dL
pH: 6.5 (ref 5.0–8.0)

## 2011-12-06 NOTE — Progress Notes (Signed)
Seen in MAU at 22 wks and last week. States tried to go to HD without success. Has been having painful UCs. First US showed EIF >> will schedule followup at [facility] this week. Cultures done. [facility] per pt.

## 2011-12-06 NOTE — Patient Instructions (Signed)
Normal Labor and Delivery °Your caregiver must first be sure you are in labor. Signs of labor include: °· You may pass what is called "the mucus plug" before labor begins. This is a small amount of blood stained mucus.  °· Regular uterine contractions.  °· The time between contractions get closer together.  °· The discomfort and pain gradually gets more intense.  °· Pains are mostly located in the back.  °· Pains get worse when walking.  °· The cervix (the opening of the uterus becomes thinner (begins to efface) and opens up (dilates).  °Once you are in labor and admitted into the hospital or care center, your caregiver will do the following: °· A complete physical examination.  °· Check your vital signs (blood pressure, pulse, temperature and the fetal heart rate).  °· Do a vaginal examination (using a sterile glove and lubricant) to determine:  °· The position (presentation) of the baby (head [vertex] or buttock first).  °· The level (station) of the baby's head in the birth canal.  °· The effacement and dilatation of the cervix.  °· You may have your pubic hair shaved and be given an enema depending on your caregiver and the circumstance.  °· An electronic monitor is usually placed on your abdomen. The monitor follows the length and intensity of the contractions, as well as the baby's heart rate.  °· Usually, your caregiver will insert an IV in your arm with a bottle of sugar water. This is done as a precaution so that medications can be given to you quickly during labor or delivery.  °NORMAL LABOR AND DELIVERY IS DIVIDED UP INTO 3 STAGES: °First Stage °This is when regular contractions begin and the cervix begins to efface and dilate. This stage can last from 3 to 15 hours. The end of the first stage is when the cervix is 100% effaced and 10 centimeters dilated. Pain medications may be given by  °· Injection (morphine, demerol, etc.)  °· Regional anesthesia (spinal, caudal or epidural, anesthetics given in  different locations of the spine). Paracervical pain medication may be given, which is an injection of and anesthetic on each side of the cervix.  °A pregnant woman may request to have "Natural Childbirth" which is not to have any medications or anesthesia during her labor and delivery. °Second Stage °This is when the baby comes down through the birth canal (vagina) and is born. This can take 1 to 4 hours. As the baby's head comes down through the birth canal, you may feel like you are going to have a bowel movement. You will get the urge to bear down and push until the baby is delivered. As the baby's head is being delivered, the caregiver will decide if an episiotomy (a cut in the perineum and vagina area) is needed to prevent tearing of the tissue in this area. The episiotomy is sewn up after the delivery of the baby and placenta. Sometimes a mask with nitrous oxide is given for the mother to breath during the delivery of the baby to help if there is too much pain. The end of Stage 2 is when the baby is fully delivered. Then when the umbilical cord stops pulsating it is clamped and cut. °Third Stage °The third stage begins after the baby is completely delivered and ends after the placenta (afterbirth) is delivered. This usually takes 5 to 30 minutes. After the placenta is delivered, a medication is given either by intravenous or injection to help contract   the uterus and prevent bleeding. The third stage is not painful and pain medication is usually not necessary. If an episiotomy was done, it is repaired at this time. °After the delivery, the mother is watched and monitored closely for 1 to 2 hours to make sure there is no postpartum bleeding (hemorrhage). If there is a lot of bleeding, medication is given to contract the uterus and stop the bleeding. °Document Released: 08/01/2008 Document Revised: 07/05/2011 Document Reviewed: 08/01/2008 °[employer]® Patient Information ©2012 [employer]. °

## 2011-12-07 LAB — OBSTETRIC PANEL
Eosinophils Absolute: 0.2 10*3/uL (ref 0.0–0.7)
Hemoglobin: 11 g/dL — ABNORMAL LOW (ref 12.0–15.0)
Hepatitis B Surface Ag: NEGATIVE
Lymphocytes Relative: 25 % (ref 12–46)
Lymphs Abs: 2.1 10*3/uL (ref 0.7–4.0)
MCH: 24.2 pg — ABNORMAL LOW (ref 26.0–34.0)
Monocytes Relative: 7 % (ref 3–12)
Neutro Abs: 5.5 10*3/uL (ref 1.7–7.7)
Neutrophils Relative %: 66 % (ref 43–77)
RBC: 4.55 MIL/uL (ref 3.87–5.11)
Rh Type: POSITIVE
Rubella: 47 IU/mL — ABNORMAL HIGH
WBC: 8.5 10*3/uL (ref 4.0–10.5)

## 2011-12-07 LAB — GC/CHLAMYDIA PROBE AMP, GENITAL: GC Probe Amp, Genital: NEGATIVE

## 2011-12-07 LAB — GLUCOSE TOLERANCE, 1 HOUR: Glucose, 1 Hour GTT: 107 mg/dL (ref 70–140)

## 2011-12-08 ENCOUNTER — Ambulatory Visit (HOSPITAL_COMMUNITY)
Admission: RE | Admit: 2011-12-08 | Discharge: 2011-12-08 | Disposition: A | Discharge status: Home / Self Care | Payer: Self-pay | Source: Ambulatory Visit | Attending: Advanced Practice Midwife | Admitting: Advanced Practice Midwife

## 2011-12-08 DIAGNOSIS — O09629 Supervision of young multigravida, unspecified trimester: Secondary | ICD-10-CM

## 2011-12-08 DIAGNOSIS — O35BXX0 Maternal care for other (suspected) fetal abnormality and damage, fetal cardiac anomalies, not applicable or unspecified: Secondary | ICD-10-CM

## 2011-12-08 DIAGNOSIS — O358XX0 Maternal care for other (suspected) fetal abnormality and damage, not applicable or unspecified: Secondary | ICD-10-CM | POA: Insufficient documentation

## 2011-12-08 DIAGNOSIS — O0930 Supervision of pregnancy with insufficient antenatal care, unspecified trimester: Secondary | ICD-10-CM

## 2011-12-08 DIAGNOSIS — O09299 Supervision of pregnancy with other poor reproductive or obstetric history, unspecified trimester: Secondary | ICD-10-CM

## 2011-12-08 LAB — HEMOGLOBINOPATHY EVALUATION
Hgb A2 Quant: 2.5 % (ref 2.2–3.2)
Hgb A: 97.5 % (ref 96.8–97.8)
Hgb F Quant: 0 % (ref 0.0–2.0)
Hgb S Quant: 0 %

## 2011-12-08 LAB — CULTURE, OB URINE

## 2011-12-13 ENCOUNTER — Inpatient Hospital Stay (HOSPITAL_COMMUNITY): Payer: Medicaid Other | Admitting: Anesthesiology

## 2011-12-13 ENCOUNTER — Ambulatory Visit (INDEPENDENT_AMBULATORY_CARE_PROVIDER_SITE_OTHER): Payer: Medicaid Other | Admitting: Family Medicine

## 2011-12-13 ENCOUNTER — Encounter (HOSPITAL_COMMUNITY): Payer: Self-pay

## 2011-12-13 ENCOUNTER — Encounter (HOSPITAL_COMMUNITY): Payer: Self-pay | Admitting: Anesthesiology

## 2011-12-13 ENCOUNTER — Inpatient Hospital Stay (HOSPITAL_COMMUNITY)
Admission: AD | Admit: 2011-12-13 | Discharge: 2011-12-16 | DRG: 775 | Disposition: A | Discharge status: Home / Self Care | Payer: Medicaid Other | Source: Ambulatory Visit | Attending: Obstetrics & Gynecology | Admitting: Obstetrics & Gynecology

## 2011-12-13 VITALS — BP 120/71 | Wt 166.0 lb

## 2011-12-13 DIAGNOSIS — O0930 Supervision of pregnancy with insufficient antenatal care, unspecified trimester: Secondary | ICD-10-CM

## 2011-12-13 DIAGNOSIS — O358XX0 Maternal care for other (suspected) fetal abnormality and damage, not applicable or unspecified: Secondary | ICD-10-CM

## 2011-12-13 DIAGNOSIS — O09299 Supervision of pregnancy with other poor reproductive or obstetric history, unspecified trimester: Secondary | ICD-10-CM

## 2011-12-13 DIAGNOSIS — Z3480 Encounter for supervision of other normal pregnancy, unspecified trimester: Secondary | ICD-10-CM

## 2011-12-13 DIAGNOSIS — O35BXX0 Maternal care for other (suspected) fetal abnormality and damage, fetal cardiac anomalies, not applicable or unspecified: Secondary | ICD-10-CM

## 2011-12-13 LAB — CBC
Platelets: 198 10*3/uL (ref 150–400)
RBC: 4.71 MIL/uL (ref 3.87–5.11)
WBC: 10.5 10*3/uL (ref 4.0–10.5)

## 2011-12-13 LAB — POCT URINALYSIS DIP (DEVICE)
Hgb urine dipstick: NEGATIVE
Ketones, ur: NEGATIVE mg/dL
Protein, ur: NEGATIVE mg/dL
Specific Gravity, Urine: 1.015 (ref 1.005–1.030)
Urobilinogen, UA: 1 mg/dL (ref 0.0–1.0)

## 2011-12-13 MED ORDER — ACETAMINOPHEN 325 MG PO TABS: 650.0000 mg | ORAL_TABLET | ORAL | Status: DC | PRN

## 2011-12-13 MED ORDER — OXYTOCIN BOLUS FROM INFUSION
500.0000 mL | Freq: Once | INTRAVENOUS | Status: AC
  Administered 2011-12-14: 500 mL via INTRAVENOUS
  Filled 2011-12-13: qty 500
  Filled 2011-12-13: qty 1000

## 2011-12-13 MED ORDER — IBUPROFEN 600 MG PO TABS
600.0000 mg | ORAL_TABLET | Freq: Four times a day (QID) | ORAL | Status: DC | PRN
  Administered 2011-12-14: 600 mg via ORAL
  Filled 2011-12-13: qty 1

## 2011-12-13 MED ORDER — SODIUM BICARBONATE 8.4 % IV SOLN
INTRAVENOUS | Status: DC | PRN
  Administered 2011-12-13: 4 mL via EPIDURAL

## 2011-12-13 MED ORDER — OXYCODONE-ACETAMINOPHEN 5-325 MG PO TABS: 1.0000 | ORAL_TABLET | ORAL | Status: DC | PRN

## 2011-12-13 MED ORDER — EPHEDRINE 5 MG/ML INJ: 10.0000 mg | INTRAVENOUS | Status: DC | PRN

## 2011-12-13 MED ORDER — PHENYLEPHRINE 40 MCG/ML (10ML) SYRINGE FOR IV PUSH (FOR BLOOD PRESSURE SUPPORT)
80.0000 ug | PREFILLED_SYRINGE | INTRAVENOUS | Status: DC | PRN
  Filled 2011-12-13: qty 5

## 2011-12-13 MED ORDER — LACTATED RINGERS IV SOLN: 500.0000 mL | Freq: Once | INTRAVENOUS | Status: DC

## 2011-12-13 MED ORDER — LIDOCAINE HCL (PF) 1 % IJ SOLN
30.0000 mL | INTRAMUSCULAR | Status: DC | PRN
  Filled 2011-12-13: qty 30

## 2011-12-13 MED ORDER — NALBUPHINE SYRINGE 5 MG/0.5 ML: 5.0000 mg | INJECTION | INTRAMUSCULAR | Status: DC | PRN

## 2011-12-13 MED ORDER — FENTANYL 2.5 MCG/ML BUPIVACAINE 1/10 % EPIDURAL INFUSION (WH - ANES)
14.0000 mL/h | INTRAMUSCULAR | Status: DC
  Administered 2011-12-13: 14 mL/h via EPIDURAL
  Filled 2011-12-13 (×2): qty 60

## 2011-12-13 MED ORDER — FLEET ENEMA 7-19 GM/118ML RE ENEM: 1.0000 | ENEMA | RECTAL | Status: DC | PRN

## 2011-12-13 MED ORDER — DIPHENHYDRAMINE HCL 50 MG/ML IJ SOLN: 12.5000 mg | INTRAMUSCULAR | Status: DC | PRN

## 2011-12-13 MED ORDER — CITRIC ACID-SODIUM CITRATE 334-500 MG/5ML PO SOLN: 30.0000 mL | ORAL | Status: DC | PRN

## 2011-12-13 MED ORDER — PHENYLEPHRINE 40 MCG/ML (10ML) SYRINGE FOR IV PUSH (FOR BLOOD PRESSURE SUPPORT): 80.0000 ug | PREFILLED_SYRINGE | INTRAVENOUS | Status: DC | PRN

## 2011-12-13 MED ORDER — LACTATED RINGERS IV SOLN
INTRAVENOUS | Status: DC
  Administered 2011-12-13 (×2): via INTRAVENOUS

## 2011-12-13 MED ORDER — EPHEDRINE 5 MG/ML INJ
10.0000 mg | INTRAVENOUS | Status: DC | PRN
  Filled 2011-12-13: qty 4

## 2011-12-13 MED ORDER — FENTANYL 2.5 MCG/ML BUPIVACAINE 1/10 % EPIDURAL INFUSION (WH - ANES)
INTRAMUSCULAR | Status: DC | PRN
  Administered 2011-12-13: 14 mL/h via EPIDURAL

## 2011-12-13 MED ORDER — LACTATED RINGERS IV SOLN
500.0000 mL | INTRAVENOUS | Status: DC | PRN
  Administered 2011-12-13: 1000 mL via INTRAVENOUS

## 2011-12-13 MED ORDER — OXYTOCIN 20 UNITS IN LACTATED RINGERS INFUSION - SIMPLE: 125.0000 mL/h | Freq: Once | INTRAVENOUS | Status: DC

## 2011-12-13 MED ORDER — ONDANSETRON HCL 4 MG/2ML IJ SOLN: 4.0000 mg | Freq: Four times a day (QID) | INTRAMUSCULAR | Status: DC | PRN

## 2011-12-13 NOTE — Progress Notes (Signed)
Amanda Murillo is a 21 y.o. G3P2001 at [redacted]w[redacted]d admitted for active labor.  Subjective: Pt is comfortable with epidural.  Family members at bedside for support.  Discussed augmentation of labor with AROM and/or Pitocin with pt at this time and she prefers to wait "to see what happens first".    Objective: BP 101/62  Pulse 83  Temp(Src) 98.6 F (37 C) (Oral)  Resp 18  Ht 5' (1.524 m)  Wt 75.297 kg (166 lb)  BMI 32.42 kg/m2  SpO2 99%  LMP 04/21/2011  Breastfeeding? Unknown      FHT:  FHR: 130 bpm, variability: moderate,  accelerations:  Present,  decelerations:  Absent UC:   irregular, every 3-5 minutes SVE (at 1955):   Dilation: 5 Effacement (%): 80 Station: -1 Exam by:: Goss, RN  Labs: Lab Results  Component Value Date   WBC 10.5 12/13/2011   HGB 11.4* 12/13/2011   HCT 34.9* 12/13/2011   MCV 74.1* 12/13/2011   PLT 198 12/13/2011    Assessment / Plan: Spontaneous labor, progressing normally  Labor: Progressing normally Preeclampsia:  n/a Fetal Wellbeing:  Category I Pain Control:  Epidural I/D:  n/a Anticipated MOD:  NSVD  LEFTWICH-KIRBY, Amanda Murillo 12/13/2011, 8:29 PM

## 2011-12-13 NOTE — ED Provider Notes (Signed)
History     No chief complaint on file.  HPI This is a 21 year old G3 P2 0021 at 39 weeks and 0 days who is seen at the lower risk clinic at [facility] hospital who presents with contractions that started after appointment earlier today. The contractions are approximately every 2-3 minutes and moderate in intensity. She admits to good fetal activity. She denies leaking fluid, vaginal discharge, vaginal bleeding. Obstetrical history includes previous pregnancy with with infant having trisomy 60 that was a stillbirth. The current pregnancy was complicated only by a fetal cardiac echogenicity that resolved by late third trimester.  OB History    Grav Para Term Preterm Abortions TAB SAB Ect Mult Living   3 2 2  0 0 0 0 0 0 1      Past Medical History  Diagnosis Date  . Asthma   . 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 Trichomonas infection     Past Surgical History  Procedure Date  . No past surgeries     Family History  Problem Relation Age of Onset  . Anesthesia problems Neg Hx     History  Substance Use Topics  . Smoking status: Never Smoker   . Smokeless tobacco: Never Used  . Alcohol Use: No    Allergies: No Known Allergies  Prescriptions prior to admission  Medication Sig Dispense Refill  . ALBUTEROL SULFATE HFA IN Inhale 2 puffs into the lungs every 6 (six) hours as needed. For shortness of breath.      . Prenatal Vit-Fe Fumarate-FA (PRENATAL MULTIVITAMIN) TABS Take 1 tablet by mouth daily.        ROS Physical Exam   Blood pressure 109/61, pulse 79, temperature 98.3 F (36.8 C), temperature source Oral, resp. rate 20, last menstrual period 04/21/2011, unknown if currently breastfeeding.  Physical Exam  Constitutional: She is oriented to person, place, and time. She appears well-developed and well-nourished.  HENT:  Head: Normocephalic and atraumatic.  Cardiovascular: Normal rate.   Respiratory: Effort normal.  GI: Soft. Bowel sounds are  normal. She exhibits no distension and no mass. There is no tenderness. There is no rebound and no guarding.       Size equals dates. Vertex by [employer].  Musculoskeletal: Normal range of motion.  Neurological: She is alert and oriented to person, place, and time.  Skin: Skin is warm and dry. No rash noted. No erythema. No pallor.  Psychiatric: She has a normal mood and affect. Her behavior is normal. Judgment and thought content normal.   Dilation: 5 Effacement (%): 70 Cervical Position: Posterior Station: -3 Presentation: Vertex Exam by:: Dr. Adrian Blackwater  Prenatal labs: ABO, Rh: B/POS/-- (01/30 1124) Antibody: NEG (01/30 1124) Rubella:   immune RPR: NON REAC (01/30 1124)  HBsAg: NEGATIVE (01/30 1124)  HIV: NON REACTIVE (01/30 1124)  GBS:   negative Genetic testing was normal.  MAU Course  Procedures   Assessment and Plan  #1 active labor #2 intrauterine pregnancy at 39 weeks #3 GBS negative #4 previous stillbirth with trisomy 53  Patient currently in active labor. Will admit to labor and delivery and provide supportive expectant management. Patient desires to breast-feed following delivery. Depo-Provera for birth control following delivery. Unknown pediatric provider at this time.  STINSON, JACOB JEHIEL 12/13/2011, 6:21 PM

## 2011-12-13 NOTE — Anesthesia Preprocedure Evaluation (Addendum)
Anesthesia Evaluation  Patient identified by MRN, date of birth, ID band Patient awake    Reviewed: Allergy & Precautions, H&P , Patient's Chart, lab work & pertinent test results  Airway Mallampati: II TM Distance: >3 FB Neck ROM: full    Dental  (+) Teeth Intact   Pulmonary asthma (Rare inhaler use) ,  clear to auscultation        Cardiovascular regular Normal    Neuro/Psych    GI/Hepatic   Endo/Other    Renal/GU      Musculoskeletal   Abdominal   Peds  Hematology   Anesthesia Other Findings       Reproductive/Obstetrics (+) Pregnancy                           Anesthesia Physical Anesthesia Plan  ASA: II  Anesthesia Plan: Epidural   Post-op Pain Management:    Induction:   Airway Management Planned:   Additional Equipment:   Intra-op Plan:   Post-operative Plan:   Informed Consent: I have reviewed the patients History and Physical, chart, labs and discussed the procedure including the risks, benefits and alternatives for the proposed anesthesia with the patient or authorized representative who has indicated his/her understanding and acceptance.   Dental Advisory Given  Plan Discussed with:   Anesthesia Plan Comments: (Labs checked- platelets confirmed with RN in room. Fetal heart tracing, per RN, reported to be stable enough for sitting procedure. Discussed epidural, and patient consents to the procedure:  included risk of possible headache,backache, failed block, allergic reaction, and nerve injury. This patient was asked if she had any questions or concerns before the procedure started. )       Anesthesia Quick Evaluation

## 2011-12-13 NOTE — Progress Notes (Signed)
Pt states contractions every 4 minutes since this morning. Denies vaginal bleeding or LOF. Reports positive fetal movement. States was dilated 1 cm last week.

## 2011-12-13 NOTE — Anesthesia Procedure Notes (Signed)
Epidural Patient location during procedure: OB  Preanesthetic Checklist Completed: patient identified, site marked, surgical consent, pre-op evaluation, timeout performed, IV checked, risks and benefits discussed and monitors and equipment checked  Epidural Patient position: sitting Prep: site prepped and draped and DuraPrep Patient monitoring: continuous pulse ox and blood pressure Approach: midline Injection technique: LOR air  Needle:  Needle type: Tuohy  Needle gauge: 17 G Needle length: 9 cm Needle insertion depth: 7 cm Catheter type: closed end flexible Catheter size: 19 Gauge Catheter at skin depth: 14 cm Test dose: negative  Assessment Events: blood aspirated (First catheter; replaced as above. Second catheter neg asp), injection not painful, no injection resistance, negative IV test and no paresthesia  Additional Notes Dosing of Epidural:  1st dose, through needle ............................................Marland Kitchen epi 1:200K + Xylocaine 40 mg  2nd dose, through catheter, after waiting 3 minutes...Marland KitchenMarland Kitchenepi 1:200K + Xylocaine 40 mg  3rd dose, through catheter after waiting 3 minutes .............................Marcaine   4mg    ( mg Marcaine are expressed as equivilent  cc's medication removed from the 0.1%Bupiv / fentanyl syringe from L&D pump)  ( 2% Xylo charted as a single dose in Epic Meds for ease of charting; actual dosing was fractionated as above, for saftey's sake)  As each dose occurred, patient was free of IV sx; and patient exhibited no evidence of SA injection.  Patient is more comfortable after epidural dosed. Please see RN's note for documentation of vital signs,and FHR which are stable.

## 2011-12-13 NOTE — Progress Notes (Signed)
No acute reports. Repeat MFM normal; Having irregular contractions.  Educated on s/sx of labor

## 2011-12-13 NOTE — Patient Instructions (Signed)
Braxton Hicks Contractions Pregnancy is commonly associated with contractions of the uterus throughout the pregnancy. Towards the end of pregnancy (32 to 34 weeks), these contractions [facility] Willa Rough) can develop more often and may become more forceful. This is not true labor because these contractions do not result in opening (dilatation) and thinning of the cervix. They are sometimes difficult to tell apart from true labor because these contractions can be forceful and people have different pain tolerances. You should not feel embarrassed if you go to the hospital with false labor. Sometimes, the only way to tell if you are in true labor is for your caregiver to follow the changes in the cervix. How to tell the difference between true and false labor:  False labor.   The contractions of false labor are usually shorter, irregular and not as hard as those of true labor.   They are often felt in the front of the lower abdomen and in the groin.   They may leave with walking around or changing positions while lying down.   They get weaker and are shorter lasting as time goes on.   These contractions are usually irregular.   They do not usually become progressively stronger, regular and closer together as with true labor.   True labor.   Contractions in true labor last 30 to 70 seconds, become very regular, usually become more intense, and increase in frequency.   They do not go away with walking.   The discomfort is usually felt in the top of the uterus and spreads to the lower abdomen and low back.   True labor can be determined by your caregiver with an exam. This will show that the cervix is dilating and getting thinner.  If there are no prenatal problems or other health problems associated with the pregnancy, it is completely safe to be sent home with false labor and await the onset of true labor. HOME CARE INSTRUCTIONS   Keep up with your usual exercises and instructions.   Take  medications as directed.   Keep your regular prenatal appointment.   Eat and drink lightly if you think you are going into labor.   If BH contractions are making you uncomfortable:   Change your activity position from lying down or resting to walking/walking to resting.   Sit and rest in a tub of warm water.   Drink 2 to 3 glasses of water. Dehydration may cause B-H contractions.   Do slow and deep breathing several times an hour.  SEEK IMMEDIATE MEDICAL CARE IF:   Your contractions continue to become stronger, more regular, and closer together.   You have a gushing, burst or leaking of fluid from the vagina.   An oral temperature above 102 F (38.9 C) develops.   You have passage of blood-tinged mucus.   You develop vaginal bleeding.   You develop continuous belly (abdominal) pain.   You have low back pain that you never had before.   You feel the baby's head pushing down causing pelvic pressure.   The baby is not moving as much as it used to.  Document Released: 10/23/2005 Document Revised: 07/05/2011 Document Reviewed: 04/16/2009 [facility] Patient Information 2012 [place], [place].  Normal Labor and Delivery Your caregiver must first be sure you are in labor. Signs of labor include:  You may pass what is called "the mucus plug" before labor begins. This is a small amount of blood stained mucus.   Regular uterine contractions.   The time  begins. This is a small amount of blood stained mucus.   Regular uterine contractions.   The time between contractions get closer together.   The discomfort and pain gradually gets more intense.   Pains are mostly located in the back.   Pains get worse when walking.   The cervix (the opening of the uterus becomes thinner (begins to efface) and opens up (dilates).  Once you are in labor and admitted into the hospital or care center, your caregiver will do the following:   A complete physical examination.   Check your vital signs (blood pressure, pulse, temperature and the fetal heart  rate).   Do a vaginal examination (using a sterile glove and lubricant) to determine:   The position (presentation) of the baby (head [vertex] or buttock first).   The level (station) of the baby's head in the birth canal.   The effacement and dilatation of the cervix.   You may have your pubic hair shaved and be given an enema depending on your caregiver and the circumstance.   An electronic monitor is usually placed on your abdomen. The monitor follows the length and intensity of the contractions, as well as the baby's heart rate.   Usually, your caregiver will insert an IV in your arm with a bottle of sugar water. This is done as a precaution so that medications can be given to you quickly during labor or delivery.  NORMAL LABOR AND DELIVERY IS DIVIDED UP INTO 3 STAGES:  First Stage  This is when regular contractions begin and the cervix begins to efface and dilate. This stage can last from 3 to 15 hours. The end of the first stage is when the cervix is 100% effaced and 10 centimeters dilated. Pain medications may be given by    Injection (morphine, demerol, etc.)   Regional anesthesia (spinal, caudal or epidural, anesthetics given in different locations of the spine). Paracervical pain medication may be given, which is an injection of and anesthetic on each side of the cervix.  A pregnant woman may request to have "Natural Childbirth" which is not to have any medications or anesthesia during her labor and delivery.  Second Stage  This is when the baby comes down through the birth canal (vagina) and is born. This can take 1 to 4 hours. As the baby's head comes down through the birth canal, you may feel like you are going to have a bowel movement. You will get the urge to bear down and push until the baby is delivered. As the baby's head is being delivered, the caregiver will decide if an episiotomy (a cut in the perineum and vagina area) is needed to prevent tearing of the tissue in this area. The  episiotomy is sewn up after the delivery of the baby and placenta. Sometimes a mask with nitrous oxide is given for the mother to breath during the delivery of the baby to help if there is too much pain. The end of Stage 2 is when the baby is fully delivered. Then when the umbilical cord stops pulsating it is clamped and cut.  Third Stage  The third stage begins after the baby is completely delivered and ends after the placenta (afterbirth) is delivered. This usually takes 5 to 30 minutes. After the placenta is delivered, a medication is given either by intravenous or injection to help contract the uterus and prevent bleeding. The third stage is not painful and pain medication is usually not necessary. If an episiotomy   is no postpartum bleeding (hemorrhage). If there is a lot of bleeding, medication is given to contract the uterus and stop the bleeding. Document Released: 08/01/2008 Document Revised: 07/05/2011 Document Reviewed: 08/01/2008 [facility] Patient Information 2012 [place], [place].

## 2011-12-13 NOTE — Progress Notes (Signed)
Deneane A Maes is a 21 y.o. G3P2001 at [redacted]w[redacted]d admitted for active labor.  Subjective: Pt is comfortable with epidural.  She reports wanting to sleep a little longer and does not want AROM.    Objective: BP 98/85  Pulse 104  Temp(Src) 98 F (36.7 C) (Oral)  Resp 18  Ht 5' (1.524 m)  Wt 75.297 kg (166 lb)  BMI 32.42 kg/m2  SpO2 99%  LMP 04/21/2011  Breastfeeding? Unknown      FHT:  FHR: 135 bpm, variability: moderate,  accelerations:  Present,  decelerations:  Absent UC:   regular, every 2 minutes SVE:   Dilation: 8.5 Effacement (%): 100 Station: -1 Exam by:: Goss, RN  Labs: Lab Results  Component Value Date   WBC 10.5 12/13/2011   HGB 11.4* 12/13/2011   HCT 34.9* 12/13/2011   MCV 74.1* 12/13/2011   PLT 198 12/13/2011    Assessment / Plan: Spontaneous labor, progressing normally  Labor: Progressing normally Preeclampsia:  n/a Fetal Wellbeing:  Category I Pain Control:  Epidural I/D:  n/a Anticipated MOD:  NSVD  LEFTWICH-KIRBY, Keelen Quevedo 12/13/2011, 11:42 PM

## 2011-12-13 NOTE — Progress Notes (Signed)
Pelvic pressure. Pulse 84.

## 2011-12-14 ENCOUNTER — Encounter (HOSPITAL_COMMUNITY): Payer: Self-pay | Admitting: Advanced Practice Midwife

## 2011-12-14 LAB — RPR: RPR Ser Ql: NONREACTIVE

## 2011-12-14 MED ORDER — ONDANSETRON HCL 4 MG/2ML IJ SOLN: 4.0000 mg | INTRAMUSCULAR | Status: DC | PRN

## 2011-12-14 MED ORDER — SIMETHICONE 80 MG PO CHEW: 80.0000 mg | CHEWABLE_TABLET | ORAL | Status: DC | PRN

## 2011-12-14 MED ORDER — OXYCODONE-ACETAMINOPHEN 5-325 MG PO TABS
1.0000 | ORAL_TABLET | ORAL | Status: DC | PRN
  Administered 2011-12-14 – 2011-12-16 (×6): 1 via ORAL
  Filled 2011-12-14 (×6): qty 1

## 2011-12-14 MED ORDER — DIBUCAINE 1 % RE OINT: 1.0000 "application " | TOPICAL_OINTMENT | RECTAL | Status: DC | PRN

## 2011-12-14 MED ORDER — LANOLIN HYDROUS EX OINT: TOPICAL_OINTMENT | CUTANEOUS | Status: DC | PRN

## 2011-12-14 MED ORDER — SENNOSIDES-DOCUSATE SODIUM 8.6-50 MG PO TABS
2.0000 | ORAL_TABLET | Freq: Every day | ORAL | Status: DC
  Administered 2011-12-14: 2 via ORAL

## 2011-12-14 MED ORDER — DIPHENHYDRAMINE HCL 25 MG PO CAPS: 25.0000 mg | ORAL_CAPSULE | Freq: Four times a day (QID) | ORAL | Status: DC | PRN

## 2011-12-14 MED ORDER — IBUPROFEN 600 MG PO TABS
600.0000 mg | ORAL_TABLET | Freq: Four times a day (QID) | ORAL | Status: DC
  Administered 2011-12-14 – 2011-12-16 (×10): 600 mg via ORAL
  Filled 2011-12-14 (×11): qty 1

## 2011-12-14 MED ORDER — BENZOCAINE-MENTHOL 20-0.5 % EX AERO: 1.0000 "application " | INHALATION_SPRAY | CUTANEOUS | Status: DC | PRN

## 2011-12-14 MED ORDER — WITCH HAZEL-GLYCERIN EX PADS: 1.0000 "application " | MEDICATED_PAD | CUTANEOUS | Status: DC | PRN

## 2011-12-14 MED ORDER — ONDANSETRON HCL 4 MG PO TABS: 4.0000 mg | ORAL_TABLET | ORAL | Status: DC | PRN

## 2011-12-14 MED ORDER — ZOLPIDEM TARTRATE 5 MG PO TABS: 5.0000 mg | ORAL_TABLET | Freq: Every evening | ORAL | Status: DC | PRN

## 2011-12-14 MED ORDER — TETANUS-DIPHTH-ACELL PERTUSSIS 5-2.5-18.5 LF-MCG/0.5 IM SUSP
0.5000 mL | Freq: Once | INTRAMUSCULAR | Status: AC
  Administered 2011-12-15: 0.5 mL via INTRAMUSCULAR
  Filled 2011-12-14: qty 0.5

## 2011-12-14 MED ORDER — PRENATAL MULTIVITAMIN CH
1.0000 | ORAL_TABLET | Freq: Every day | ORAL | Status: DC
  Administered 2011-12-14 – 2011-12-16 (×3): 1 via ORAL
  Filled 2011-12-14 (×3): qty 1

## 2011-12-14 NOTE — Progress Notes (Signed)
UR chart review completed.  

## 2011-12-14 NOTE — Anesthesia Postprocedure Evaluation (Signed)
  Anesthesia Post-op Note  Patient: Amanda Murillo  Procedure(s) Performed: * No procedures listed *  Patient Location: Mother/Baby  Anesthesia Type: Epidural  Level of Consciousness: awake, alert  and oriented  Airway and Oxygen Therapy: Patient Spontanous Breathing  Post-op Pain: mild  Post-op Assessment: Patient's Cardiovascular Status Stable and Respiratory Function Stable  Post-op Vital Signs: stable  Complications: No apparent anesthesia complications

## 2011-12-15 ENCOUNTER — Encounter (HOSPITAL_COMMUNITY): Payer: Self-pay | Admitting: *Deleted

## 2011-12-15 NOTE — Progress Notes (Signed)
Post Partum Day 1 Subjective: no complaints, up ad lib, voiding, tolerating PO and + flatus  Objective: Blood pressure 99/58, pulse 77, temperature 98.3 F (36.8 C), temperature source Oral, resp. rate 18, height 5' (1.524 m), weight 75.297 kg (166 lb), last menstrual period 04/21/2011, SpO2 99.00%, unknown if currently breastfeeding.  Physical Exam:  General: alert, cooperative and no distress Lochia: appropriate Uterine Fundus: firm Incision: n/a DVT Evaluation: No evidence of DVT seen on physical exam. Negative Homan's sign. No cords or calf tenderness. No significant calf/ankle edema.   Basename 12/13/11 1840  HGB 11.4*  HCT 34.9*    Assessment/Plan: Plan for discharge tomorrow, Breastfeeding, Lactation consult and Contraception Depo   LOS: 2 days   Joellyn Haff, SNM 12/15/2011, 7:50 AM

## 2011-12-15 NOTE — Progress Notes (Signed)
PSYCHOSOCIAL ASSESSMENT ~ MATERNAL/CHILD Name: Amanda Murillo                                                                                            Age: 23 day  Referral Date: 12/15/11 Reason/Source: LPNC, flat affect/CN  I. FAMILY/HOME ENVIRONMENT A. Child's Legal Guardian ___Parent(s) ___Grandparent ___Foster parent ___DSS_________________ Name: Amanda Murillo                                         DOB: 03/28/1991           Age: 20  Address: [address] [place],  [address]  Name: FOB not involved                                    DOB: //                     Age:   Address:   B. Other Household Members/Support Persons Name: Amanda Murillo           Relationship: MGM                   Name: Amanda Murillo                              Relationship: brother (1)                   Name:                                         Relationship:                        DOB ___/___/___                   Name:                                         Relationship:                        DOB ___/___/___  C. Other Support: sister, Amanda Murillo's father   II. PSYCHOSOCIAL DATA A. Information Source                                                                                               _x_Patient Interview  __Family Interview           _x_Other: chart  B. Financial and Community Resources __Employment: _x_Medicaid    County: [place]                 __Private Insurance:                   __Self Pay  _x_Food Stamps   _x_WIC __[employer] First     __Public Housing     __Section 8    __Maternity Care Coordination/Child Service Coordination/Early Intervention  __School:                                                                         Grade:  __Other:   C. Cultural and Environment Information Cultural Issues Impacting Care: none known  III. STRENGTHS _x__Supportive family/friends _x__Adequate Resources _x__Compliance with medical plan _x__Home prepared for Child (including basic  supplies) ___Understanding of illness      _x__Other: Pediatrician will be [facility] IV. RISK FACTORS AND CURRENT PROBLEMS         __x__No Problems Noted                                                                                                                                                                                                                                       Pt              Family     Substance Abuse                                                                ___              ___        Mental Illness                                                                          ___              ___  Family/Relationship Issues                                      ___               ___             Abuse/Neglect/Domestic Violence                                         ___         ___  Financial Resources                                        ___              ___             Transportation                                                                        ___               ___  DSS Involvement                                                                   ___              ___  Adjustment to Illness                                                               ___              ___  Knowledge/Cognitive Deficit                                                   ___              ___             Compliance with Treatment                                                 ___                ___  Basic Needs (food, housing, etc.)                                          ___              ___             Housing Concerns                                       ___              ___ Other_____________________________________________________________            V. SOCIAL WORK ASSESSMENT SW met with MOB in her first floor room to complete assessment and evaluate current emotional state.  MOB's affect was indeed very flat at first, but she became more animated and engaged as  the conversation went on.  MOB states she and baby are doing well and she was very attentive to baby, whom she was holding, while we spoke.  She reports that she and her mother live together, as well as her 23 year old son.  She states she has good supports.  SW asked about her LPNC.  She states that she had trouble getting an appointment and getting her Medicaid.  SW explained hospital drug screen policy and MOB was not concerned and denies any drug use.  Baby's UDS is negative.  MOB states she has all necessary supplies for baby at home.  She mentioned the birth defect of her first child and SW asked her how she is coping with the birth of another baby after experiencing that loss.  She states that she has not forgotten him, but that she has worked through it and is happy about this baby.  She states she did not receive counseling or take medication for depression at that time and does not think she needs either at this time.  She reports no PPD with last pregnancy.  She states FOB is not involved, but that she is okay with this.  She states her son's father is supportive to him.  She asked SW about a car seat and states that she has a friend who may be able to let her use one or her sister will possibly be able to buy her one.  SW explained that she should exhaust her resources first and if she is still in need, to ask her nurse to call house coverage tomorrow for a $30 car seat from [employer].  She states no other questions or needs and seemed appreciative of SW's visit.  VI. SOCIAL WORK PLAN  _x__No Further Intervention Required/No Barriers to Discharge   ___Psychosocial Support and Ongoing Assessment of Needs   ___Patient/Family Education:   ___Child Protective Services Report   County___________ Date___/____/____   ___Information/Referral to Community Resources_________________________   ___Other:        

## 2011-12-16 ENCOUNTER — Encounter (HOSPITAL_COMMUNITY): Payer: Self-pay | Admitting: Family Medicine

## 2011-12-16 MED ORDER — IBUPROFEN 600 MG PO TABS: 600.0000 mg | ORAL_TABLET | Freq: Four times a day (QID) | ORAL | Status: AC

## 2011-12-16 NOTE — Discharge Summary (Signed)
Obstetric Discharge Summary Reason for Admission: onset of labor Prenatal Procedures: none Intrapartum Procedures: spontaneous vaginal delivery Postpartum Procedures: none Complications-Operative and Postpartum: none Hemoglobin  Date Value Range Status  12/13/2011 11.4* 12.0-15.0 (g/dL) Final     HCT  Date Value Range Status  12/13/2011 34.9* 36.0-46.0 (%) Final    Discharge Diagnoses: Term Pregnancy-delivered  Discharge Information: Date: 12/16/2011 Activity: pelvic rest Diet: routine Medications: PNV and Ibuprofen Condition: stable Instructions: refer to practice specific booklet Discharge to: home Follow-up Information    Follow up with [facility] in 6 weeks.         Newborn Data: Live born female  Birth Weight: 5 lb 14 oz (2665 g) APGAR: 8, 9  Home with mother.  Amanda Murillo JEHIEL 12/16/2011, 7:45 AM

## 2011-12-20 ENCOUNTER — Encounter: Payer: Self-pay | Admitting: Advanced Practice Midwife

## 2012-01-24 ENCOUNTER — Encounter: Payer: Self-pay | Admitting: Advanced Practice Midwife

## 2012-01-24 ENCOUNTER — Ambulatory Visit (INDEPENDENT_AMBULATORY_CARE_PROVIDER_SITE_OTHER): Payer: Medicaid Other | Admitting: Advanced Practice Midwife

## 2012-01-24 DIAGNOSIS — Z3049 Encounter for surveillance of other contraceptives: Secondary | ICD-10-CM

## 2012-01-24 LAB — POCT PREGNANCY, URINE: Preg Test, Ur: NEGATIVE

## 2012-01-24 MED ORDER — MEDROXYPROGESTERONE ACETATE 150 MG/ML IM SUSP
150.0000 mg | Freq: Once | INTRAMUSCULAR | Status: DC
Start: 2012-01-24 — End: 2012-01-24

## 2012-01-24 MED ORDER — MEDROXYPROGESTERONE ACETATE 150 MG/ML IM SUSP
150.0000 mg | INTRAMUSCULAR | Status: DC
  Administered 2012-01-24: 150 mg via INTRAMUSCULAR

## 2012-01-24 NOTE — Patient Instructions (Signed)

## 2012-01-24 NOTE — Progress Notes (Signed)
  Subjective:    Patient ID: Amanda Murillo, female    DOB: 01-16-1991, 21 y.o.   MRN: [id number]  HPI Patient presents for 6 week postpartum follow up with no complaints.  She is continuing to breast feed without any issues.  Her last menstrual cycle was on 01/15/12 continuing for a couple extra days with normal flow.  She requests Depo Provera for family planning.     Review of Systems  Constitutional: Negative for fatigue.  Respiratory: Negative for shortness of breath.   Cardiovascular: Negative for chest pain, palpitations and leg swelling.  Neurological: Negative for headaches.      Objective:   Physical Exam  Constitutional: She is oriented to person, place, and time. She appears well-developed and well-nourished. No distress.  HENT:  Head: Normocephalic and atraumatic.  Neck: Neck supple.  Pulmonary/Chest: Effort normal.  Genitourinary: Vagina normal.  Musculoskeletal: She exhibits no edema.  Neurological: She is alert and oriented to person, place, and time.  Cervix: Closed.  No tenderness with palpation of cervix or adnexa.  No masses noted. Uterus: Normal size.  No tenderness or masses noted.     Assessment & Plan:  21 yo G3P3002 presenting for postpartum care.  Depo Provera injection for family planning today.

## 2012-01-24 NOTE — Progress Notes (Signed)
  Subjective:     Amanda Murillo is a 21 y.o. female who presents for a postpartum visit. She is 6 weeks postpartum following a spontaneous vaginal delivery. I have fully reviewed the prenatal and intrapartum course. The delivery was at term Outcome: spontaneous vaginal delivery. Anesthesia: epidural. Postpartum course has been normal. Baby's course has been normal. Baby is feeding by breast. Bleeding no bleeding. Bowel function is normal. Bladder function is normal. Patient is not sexually active. Contraception method is Depo-Provera injections. Postpartum depression screening: negative.  The following portions of the patient's history were reviewed and updated as appropriate: allergies, current medications, past family history, past medical history, past social history, past surgical history and problem list.  Review of Systems Pertinent items are noted in HPI.   Objective:    BP 102/60  Pulse 71  Temp(Src) 97.2 F (36.2 C) (Oral)  Resp 16  Ht 4\' 11"  (1.499 m)  Wt 152 lb 14.4 oz (69.355 kg)  BMI 30.88 kg/m2  LMP 01/14/2012  Breastfeeding? Yes  General:  alert   Breasts:  inspection negative, no nipple discharge or bleeding, no masses or nodularity palpable  Lungs:   Heart:    Abdomen: soft, non-tender; bowel sounds normal; no masses,  no organomegaly   Vulva:  normal  Vagina: normal vagina  Cervix:  no lesions  Corpus: normal and normal size, contour, position, consistency, mobility, non-tender  Adnexa:  normal adnexa  Rectal Exam: Not performed.        Assessment:    Normal postpartum exam. Pap smear not done at today's visit.   Plan:    1. Contraception: Depo-Provera injections 2. Resume normal activity 3. Follow up in: several months for pap or as needed.

## 2012-04-10 ENCOUNTER — Ambulatory Visit: Payer: Medicaid Other

## 2013-01-22 ENCOUNTER — Emergency Department (HOSPITAL_COMMUNITY)
Admission: EM | Admit: 2013-01-22 | Discharge: 2013-01-22 | Disposition: A | Discharge status: Home / Self Care | Payer: Self-pay | Attending: Emergency Medicine | Admitting: Emergency Medicine

## 2013-01-22 ENCOUNTER — Encounter (HOSPITAL_COMMUNITY): Payer: Self-pay | Admitting: Emergency Medicine

## 2013-01-22 DIAGNOSIS — L02419 Cutaneous abscess of limb, unspecified: Secondary | ICD-10-CM

## 2013-01-22 DIAGNOSIS — Z8619 Personal history of other infectious and parasitic diseases: Secondary | ICD-10-CM | POA: Insufficient documentation

## 2013-01-22 DIAGNOSIS — IMO0002 Reserved for concepts with insufficient information to code with codable children: Secondary | ICD-10-CM | POA: Insufficient documentation

## 2013-01-22 DIAGNOSIS — Z79899 Other long term (current) drug therapy: Secondary | ICD-10-CM | POA: Insufficient documentation

## 2013-01-22 DIAGNOSIS — J45909 Unspecified asthma, uncomplicated: Secondary | ICD-10-CM | POA: Insufficient documentation

## 2013-01-22 MED ORDER — CLINDAMYCIN HCL 150 MG PO CAPS: 150.0000 mg | ORAL_CAPSULE | Freq: Four times a day (QID) | ORAL | Status: DC

## 2013-01-22 MED ORDER — HYDROCODONE-ACETAMINOPHEN 5-325 MG PO TABS: 1.0000 | ORAL_TABLET | Freq: Four times a day (QID) | ORAL | Status: DC | PRN

## 2013-01-22 NOTE — ED Notes (Signed)
Pt reports she has two abscesses under right arm x 2 months. States has had others but they cleared up on their on.

## 2013-01-22 NOTE — ED Provider Notes (Signed)
History    This chart was scribed for non-physician practitioner working with Raeford Razor, MD by ED Scribe, Burman Nieves. This patient was seen in room TR07C/TR07C and the patient's care was started at 4:17 PM.   CSN: [id number]  Arrival date & time 01/22/13  1417   First MD Initiated Contact with Patient 01/22/13 1504      No chief complaint on file.   (Consider location/radiation/quality/duration/timing/severity/associated sxs/prior treatment) The history is provided by the patient. No language interpreter was used.   Amanda Murillo is a 22 y.o. female who presents to the Emergency Department complaining of moderate constant irritation due to two abscesses under her right arm onset for 2 months. Pt states that she had prior abscesses about 2-3 months ago but they cleared up. She came into the ED today due to abscess persistence and pain when touched. Pt states that under her right arm there are two significant abscesses with one smaller, grouped together under her right arm. Pt denies any abnormal weight loss, trouble breathing or swallowing, fever, diaphoresis, or chest pain. Pt also denies fever, chills, cough, nausea, vomiting, diarrhea, SOB, weakness, and any other associated symptoms. Pt is UTD with Tetanus and denies any allergies to medications.    Past Medical History  Diagnosis Date  . Asthma   . 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 Trichomonas infection     Past Surgical History  Procedure Laterality Date  . No past surgeries      Family History  Problem Relation Age of Onset  . Anesthesia problems Neg Hx     History  Substance Use Topics  . Smoking status: Never Smoker   . Smokeless tobacco: Never Used  . Alcohol Use: No    OB History   Grav Para Term Preterm Abortions TAB SAB Ect Mult Living   3 3 3  0 0 0 0 0 0 2      Review of Systems  Constitutional: Negative for fever and chills.  HENT: Negative for sore throat.   Eyes:  Negative for visual disturbance.  Respiratory: Negative for cough and shortness of breath.   Cardiovascular: Negative for chest pain.  Gastrointestinal: Negative for nausea, vomiting and diarrhea.  Genitourinary: Negative for dysuria.  Skin:       Abscess   Hematological: Does not bruise/bleed easily.  Psychiatric/Behavioral: Negative for confusion.    Allergies  Review of patient's allergies indicates no known allergies.  Home Medications   Current Outpatient Rx  Name  Route  Sig  Dispense  Refill  . albuterol (PROVENTIL HFA;VENTOLIN HFA) 108 (90 BASE) MCG/ACT inhaler   Inhalation   Inhale 2 puffs into the lungs every 6 (six) hours as needed for wheezing.           BP 100/73  Pulse 70  Temp(Src) 98.1 F (36.7 C) (Oral)  Resp 18  SpO2 100%  LMP 12/28/2012  Physical Exam  Nursing note and vitals reviewed. Constitutional: She is oriented to person, place, and time. She appears well-developed and well-nourished. No distress.  HENT:  Head: Normocephalic and atraumatic.  Eyes: EOM are normal.  Neck: Neck supple. No tracheal deviation present.  Cardiovascular: Normal rate.   Pulmonary/Chest: Effort normal. No respiratory distress.  Musculoskeletal: Normal range of motion.  Neurological: She is alert and oriented to person, place, and time.  Skin: Skin is warm and dry.  Area of enduration and fluctuance were noted under her right axillary fold with three  separate nodules .No rash to surrounding skin. Moderate tenderness upon palpation.  Psychiatric: She has a normal mood and affect. Her behavior is normal.    ED Course  Procedures (including critical care time) DIAGNOSTIC STUDIES: Oxygen Saturation is 100% on room air, normal by my interpretation.    COORDINATION OF CARE: 3:28 PM Discussed ED treatment with pt and pt agrees.   4:53 PM Attempted of I&D without pustular discharge.  An [place] were used at bedside that shows a small collection of fluid suggestive of  abscess.  However, unable to reach it.  No obvious evidence concerning for neoplasm based on basic examination with bedside US.  My attending was aware and has performed US as well and agrees.  Plan to have pt use warm compress, take abx and pain meds and return precaution.  Pt to avoid shaving.    INCISION AND DRAINAGE Performed by: Fayrene Helper Consent: Verbal consent obtained. Risks and benefits: risks, benefits and alternatives were discussed Type: abscess  Body area: R axillary fold  Anesthesia: local infiltration  Incision was made with a scalpel.  Local anesthetic: lidocaine 2% w epinephrine  Anesthetic total: 4 ml  Complexity: complex Blunt dissection to break up loculations  Drainage: none  Drainage amount: none  Packing material: none  Patient tolerance: Patient tolerated the procedure well with no immediate complications.      Labs Reviewed - No data to display No results found.   1. Abscess of axillary fold     BP 100/73  Pulse 70  Temp(Src) 98.1 F (36.7 C) (Oral)  Resp 18  SpO2 100%  LMP 12/28/2012   MDM   I personally performed the services described in this documentation, which was scribed in my presence. The recorded information has been reviewed and is accurate.         Fayrene Helper, PA-C 01/22/13 1659

## 2013-01-23 NOTE — ED Provider Notes (Signed)
Medical screening examination/treatment/procedure(s) were performed by non-physician practitioner and as supervising physician I was immediately available for consultation/collaboration.  Kemani Demarais, MD 01/23/13 1419 

## 2013-02-24 ENCOUNTER — Encounter (HOSPITAL_COMMUNITY): Payer: Self-pay | Admitting: Emergency Medicine

## 2013-02-24 ENCOUNTER — Emergency Department (HOSPITAL_COMMUNITY)
Admission: EM | Admit: 2013-02-24 | Discharge: 2013-02-24 | Disposition: A | Discharge status: Home / Self Care | Payer: Self-pay | Attending: Emergency Medicine | Admitting: Emergency Medicine

## 2013-02-24 DIAGNOSIS — IMO0002 Reserved for concepts with insufficient information to code with codable children: Secondary | ICD-10-CM | POA: Insufficient documentation

## 2013-02-24 DIAGNOSIS — Z8619 Personal history of other infectious and parasitic diseases: Secondary | ICD-10-CM | POA: Insufficient documentation

## 2013-02-24 DIAGNOSIS — J45909 Unspecified asthma, uncomplicated: Secondary | ICD-10-CM | POA: Insufficient documentation

## 2013-02-24 DIAGNOSIS — L02411 Cutaneous abscess of right axilla: Secondary | ICD-10-CM

## 2013-02-24 DIAGNOSIS — Z79899 Other long term (current) drug therapy: Secondary | ICD-10-CM | POA: Insufficient documentation

## 2013-02-24 MED ORDER — LIDOCAINE HCL (PF) 1 % IJ SOLN: 5.0000 mL | Freq: Once | INTRAMUSCULAR | Status: DC

## 2013-02-24 MED ORDER — HYDROCODONE-ACETAMINOPHEN 5-325 MG PO TABS: 2.0000 | ORAL_TABLET | ORAL | Status: DC | PRN

## 2013-02-24 MED ORDER — HYDROCODONE-ACETAMINOPHEN 5-325 MG PO TABS
1.0000 | ORAL_TABLET | Freq: Once | ORAL | Status: AC
  Administered 2013-02-24: 1 via ORAL
  Filled 2013-02-24: qty 1

## 2013-02-24 NOTE — ED Provider Notes (Signed)
History     CSN: [id number]  Arrival date & time 02/24/13  0118   First MD Initiated Contact with Patient 02/24/13 0131      Chief Complaint  Patient presents with  . Abscess    (Consider location/radiation/quality/duration/timing/severity/associated sxs/prior treatment) HPI Comments: She was a small abscess in the right axilla, but it's been there for 3 to the area.  That was I&D and without any drainage.  The site is not draining at this time.  It is red, joint, and painful  Patient is a 22 y.o. female presenting with abscess. The history is provided by the patient.  Abscess Location:  Shoulder/arm Shoulder/arm abscess location:  R axilla Abscess quality: fluctuance, painful and redness   Abscess quality: not draining   Red streaking: no   Duration:  4 weeks Progression:  Worsening Pain details:    Quality:  Pressure and throbbing   Severity:  Moderate   Timing:  Constant   Progression:  Worsening Chronicity:  Recurrent Associated symptoms: no fever and no nausea     Past Medical History  Diagnosis Date  . Asthma   . 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 Trichomonas infection     Past Surgical History  Procedure Laterality Date  . No past surgeries      Family History  Problem Relation Age of Onset  . Anesthesia problems Neg Hx     History  Substance Use Topics  . Smoking status: Never Smoker   . Smokeless tobacco: Never Used  . Alcohol Use: No    OB History   Grav Para Term Preterm Abortions TAB SAB Ect Mult Living   3 3 3  0 0 0 0 0 0 2      Review of Systems  Constitutional: Negative for fever and chills.  Gastrointestinal: Negative for nausea.  Musculoskeletal: Negative for myalgias and joint swelling.  Skin: Positive for wound. Negative for rash.  All other systems reviewed and are negative.    Allergies  Review of patient's allergies indicates no known allergies.  Home Medications   Current Outpatient Rx   Name  Route  Sig  Dispense  Refill  . albuterol (PROVENTIL HFA;VENTOLIN HFA) 108 (90 BASE) MCG/ACT inhaler   Inhalation   Inhale 2 puffs into the lungs every 6 (six) hours as needed for wheezing.         Marland Kitchen HYDROcodone-acetaminophen (NORCO/VICODIN) 5-325 MG per tablet   Oral   Take 2 tablets by mouth every 4 (four) hours as needed for pain.   10 tablet   0     BP 133/83  Pulse 81  Temp(Src) 97.5 F (36.4 C) (Oral)  Resp 16  SpO2 100%  LMP 02/03/2013  Physical Exam  Constitutional: She appears well-developed.  HENT:  Head: Normocephalic.  Eyes: Pupils are equal, round, and reactive to light.  Neck: Normal range of motion.  Cardiovascular: Normal rate.   Pulmonary/Chest: Effort normal.  Musculoskeletal: Normal range of motion.  Neurological: She is alert.  Skin: Skin is warm and dry. No erythema.  Fluctuant abscess in the right axilla    ED Course  INCISION AND DRAINAGE Date/Time: 02/24/2013 2:12 AM Performed by: Arman Filter Authorized by: Arman Filter Consent: Verbal consent obtained. Risks and benefits: risks, benefits and alternatives were discussed Consent given by: patient Patient understanding: patient states understanding of the procedure being performed Patient identity confirmed: verbally with patient Time out: Immediately prior to procedure a "time  out" was called to verify the correct patient, procedure, equipment, support staff and site/side marked as required. Type: abscess Body area: upper extremity (Right axilla) Anesthesia: local infiltration Local anesthetic: lidocaine 1% without epinephrine Anesthetic total: 2 ml Patient sedated: no Scalpel size: 11 Needle gauge: 22 Incision type: single straight Drainage: purulent Drainage amount: moderate Wound treatment: drain placed Packing material: 1/4 in iodoform gauze Patient tolerance: Patient tolerated the procedure well with no immediate complications.   (including critical care  time)  Labs Reviewed - No data to display No results found.   1. Abscess of axilla, right       MDM   Moderate amount of pertinent drainage, small amount of quarter-inch packing placed to allow further drainage        Arman Filter, NP 02/24/13 0216

## 2013-02-24 NOTE — ED Provider Notes (Signed)
Medical screening examination/treatment/procedure(s) were performed by non-physician practitioner and as supervising physician I was immediately available for consultation/collaboration.   Brandt Loosen, MD 02/24/13 [phone]

## 2013-02-24 NOTE — ED Notes (Signed)
PT. REPORTS ABSCESS "BOIL" AT RIGHT AXILLA FOR SEVERAL WEEKS WITH DRAINAGE.

## 2013-04-15 ENCOUNTER — Encounter (HOSPITAL_COMMUNITY): Payer: Self-pay | Admitting: Emergency Medicine

## 2013-04-15 ENCOUNTER — Emergency Department (HOSPITAL_COMMUNITY)
Admission: EM | Admit: 2013-04-15 | Discharge: 2013-04-15 | Disposition: A | Discharge status: Home / Self Care | Payer: Self-pay | Attending: Emergency Medicine | Admitting: Emergency Medicine

## 2013-04-15 DIAGNOSIS — Z8619 Personal history of other infectious and parasitic diseases: Secondary | ICD-10-CM | POA: Insufficient documentation

## 2013-04-15 DIAGNOSIS — R062 Wheezing: Secondary | ICD-10-CM | POA: Insufficient documentation

## 2013-04-15 DIAGNOSIS — J45909 Unspecified asthma, uncomplicated: Secondary | ICD-10-CM | POA: Insufficient documentation

## 2013-04-15 DIAGNOSIS — Z79899 Other long term (current) drug therapy: Secondary | ICD-10-CM | POA: Insufficient documentation

## 2013-04-15 DIAGNOSIS — R112 Nausea with vomiting, unspecified: Secondary | ICD-10-CM | POA: Insufficient documentation

## 2013-04-15 DIAGNOSIS — O09899 Supervision of other high risk pregnancies, unspecified trimester: Secondary | ICD-10-CM | POA: Insufficient documentation

## 2013-04-15 DIAGNOSIS — Z3490 Encounter for supervision of normal pregnancy, unspecified, unspecified trimester: Secondary | ICD-10-CM

## 2013-04-15 DIAGNOSIS — O9989 Other specified diseases and conditions complicating pregnancy, childbirth and the puerperium: Secondary | ICD-10-CM | POA: Insufficient documentation

## 2013-04-15 DIAGNOSIS — R0602 Shortness of breath: Secondary | ICD-10-CM | POA: Insufficient documentation

## 2013-04-15 DIAGNOSIS — J45901 Unspecified asthma with (acute) exacerbation: Secondary | ICD-10-CM

## 2013-04-15 LAB — POCT PREGNANCY, URINE: Preg Test, Ur: POSITIVE — AB

## 2013-04-15 MED ORDER — IPRATROPIUM BROMIDE 0.02 % IN SOLN
0.5000 mg | Freq: Once | RESPIRATORY_TRACT | Status: AC
  Administered 2013-04-15: 0.5 mg via RESPIRATORY_TRACT
  Filled 2013-04-15: qty 2.5

## 2013-04-15 MED ORDER — ALBUTEROL SULFATE HFA 108 (90 BASE) MCG/ACT IN AERS
INHALATION_SPRAY | RESPIRATORY_TRACT | Status: AC
  Administered 2013-04-15: 2 via RESPIRATORY_TRACT
  Filled 2013-04-15: qty 6.7

## 2013-04-15 MED ORDER — ALBUTEROL SULFATE HFA 108 (90 BASE) MCG/ACT IN AERS: 2.0000 | INHALATION_SPRAY | RESPIRATORY_TRACT | Status: DC | PRN

## 2013-04-15 MED ORDER — ALBUTEROL SULFATE (5 MG/ML) 0.5% IN NEBU: 5.0000 mg | INHALATION_SOLUTION | Freq: Four times a day (QID) | RESPIRATORY_TRACT | Status: DC | PRN

## 2013-04-15 MED ORDER — ALBUTEROL SULFATE (5 MG/ML) 0.5% IN NEBU
5.0000 mg | INHALATION_SOLUTION | Freq: Once | RESPIRATORY_TRACT | Status: AC
  Administered 2013-04-15: 5 mg via RESPIRATORY_TRACT
  Filled 2013-04-15: qty 1

## 2013-04-15 MED ORDER — ONDANSETRON 4 MG PO TBDP: 4.0000 mg | ORAL_TABLET | Freq: Three times a day (TID) | ORAL | Status: DC | PRN

## 2013-04-15 NOTE — ED Notes (Signed)
Pt c/o shortness of breath x 2 days. Given breathing tx in triage, states feels better now. Also reports nausea, vomiting intermittently. No abdominal pain.

## 2013-04-15 NOTE — ED Notes (Signed)
Pt c/o increased SOB and asthma x 2 days; pt sts home meds not helping

## 2013-04-15 NOTE — ED Provider Notes (Signed)
History     CSN: [id number]  Arrival date & time 04/15/13  0917   First MD Initiated Contact with Patient 04/15/13 1129      Chief Complaint  Patient presents with  . Shortness of Breath  . Asthma    (Consider location/radiation/quality/duration/timing/severity/associated sxs/prior treatment) HPI Comments: Patient with history of asthma presents with worsening symptoms including cough and wheezing since this morning. Patient is uncertain of trigger but states that she has been nauseous in the morning and she missed her menstrual period. She thinks that the vomiting is making her breathing worse. No cold symptoms. No fever. No abdominal pain. Patient has been using an expired albuterol inhaler which she states makes her symptoms worse. She has been using her son's albuterol nebulizer with some relief. No other treatments prior to arrival. Onset of symptoms gradual. Course is constant.   Patient is a 21 y.o. female presenting with shortness of breath and asthma. The history is provided by the patient and medical records.  Shortness of Breath Associated symptoms: cough, vomiting and wheezing   Associated symptoms: no abdominal pain, no chest pain, no fever, no headaches, no rash and no sore throat   Asthma Associated symptoms include coughing, nausea and vomiting. Pertinent negatives include no abdominal pain, chest pain, fever, headaches, myalgias, rash or sore throat.    Past Medical History  Diagnosis Date  . Asthma   . 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 Trichomonas infection     Past Surgical History  Procedure Laterality Date  . No past surgeries      Family History  Problem Relation Age of Onset  . Anesthesia problems Neg Hx     History  Substance Use Topics  . Smoking status: Never Smoker   . Smokeless tobacco: Never Used  . Alcohol Use: No    OB History   Grav Para Term Preterm Abortions TAB SAB Ect Mult Living   3 3 3  0 0 0 0 0 0 2       Review of Systems  Constitutional: Negative for fever.  HENT: Negative for sore throat and rhinorrhea.   Eyes: Negative for redness.  Respiratory: Positive for cough, shortness of breath and wheezing.   Cardiovascular: Negative for chest pain.  Gastrointestinal: Positive for nausea and vomiting. Negative for abdominal pain and diarrhea.  Genitourinary: Negative for dysuria.  Musculoskeletal: Negative for myalgias.  Skin: Negative for rash.  Neurological: Negative for headaches.    Allergies  Review of patient's allergies indicates no known allergies.  Home Medications   Current Outpatient Rx  Name  Route  Sig  Dispense  Refill  . albuterol (PROVENTIL HFA;VENTOLIN HFA) 108 (90 BASE) MCG/ACT inhaler   Inhalation   Inhale 2 puffs into the lungs every 6 (six) hours as needed for wheezing.         Marland Kitchen HYDROcodone-acetaminophen (NORCO/VICODIN) 5-325 MG per tablet   Oral   Take 2 tablets by mouth every 4 (four) hours as needed for pain.   10 tablet   0     BP 95/64  Pulse 96  Temp(Src) 98.4 F (36.9 C) (Oral)  Resp 23  SpO2 97%  Physical Exam  Nursing note and vitals reviewed. Constitutional: She appears well-developed and well-nourished.  HENT:  Head: Normocephalic and atraumatic.  Nose: Nose normal.  Mouth/Throat: Oropharynx is clear and moist.  Eyes: Conjunctivae are normal. Right eye exhibits no discharge. Left eye exhibits no discharge.  Neck: Normal range  of motion. Neck supple.  Cardiovascular: Normal rate, regular rhythm and normal heart sounds.   Pulmonary/Chest: Effort normal and breath sounds normal. She has no wheezes. She has no rales.  Abdominal: Soft. There is no tenderness.  Neurological: She is alert.  Skin: Skin is warm and dry.  Psychiatric: She has a normal mood and affect.    ED Course  Procedures (including critical care time)  Labs Reviewed  POCT PREGNANCY, URINE - Abnormal; Notable for the following:    Preg Test, Ur POSITIVE (*)     All other components within normal limits   No results found.   1. Asthma attack   2. Pregnancy     11:36 AM Patient seen and examined. Work-up initiated. Patient has already been given breathing treatment.   Vital signs reviewed and are as follows: Filed Vitals:   04/15/13 0928  BP: 95/64  Pulse: 96  Temp: 98.4 F (36.9 C)  Resp: 23   12:35 PM Patient continues to do well. Informed of positive pregnancy result.   Pt given albuterol inhaler. Patient counseled on use of albuterol HFA.  Told to use 1-2 puffs q 4 hours as needed for SOB.  Urged to f/u with OB for pregnancy, being pre-natal vitamins.   MDM  Cough/wheezing: likely 2/2 asthma per history. Do not suspect PE. No CP, tachycardia. No LE edema. Mild SOB only, patient not in any respiratory distress. Pt much improved with albuterol -- no wheezing on my exam after 1st treatment. Pt remains stable in ED. She is stable for d/c to home. No emergent conditions suspected.        Renne Crigler, PA-C 04/15/13 1238

## 2013-04-15 NOTE — ED Notes (Signed)
Pt ambulated to restroom. 

## 2013-04-15 NOTE — ED Provider Notes (Signed)
Medical screening examination/treatment/procedure(s) were performed by non-physician practitioner and as supervising physician I was immediately available for consultation/collaboration.    Nelia Shi, MD 04/15/13 1239

## 2013-05-29 ENCOUNTER — Emergency Department (HOSPITAL_COMMUNITY)
Admission: EM | Admit: 2013-05-29 | Discharge: 2013-05-29 | Disposition: A | Discharge status: Home / Self Care | Payer: Self-pay | Attending: Emergency Medicine | Admitting: Emergency Medicine

## 2013-05-29 ENCOUNTER — Emergency Department (HOSPITAL_COMMUNITY): Payer: Self-pay

## 2013-05-29 ENCOUNTER — Encounter (HOSPITAL_COMMUNITY): Payer: Self-pay | Admitting: Emergency Medicine

## 2013-05-29 DIAGNOSIS — R0602 Shortness of breath: Secondary | ICD-10-CM | POA: Insufficient documentation

## 2013-05-29 DIAGNOSIS — Z79899 Other long term (current) drug therapy: Secondary | ICD-10-CM | POA: Insufficient documentation

## 2013-05-29 DIAGNOSIS — Z8619 Personal history of other infectious and parasitic diseases: Secondary | ICD-10-CM | POA: Insufficient documentation

## 2013-05-29 DIAGNOSIS — J45901 Unspecified asthma with (acute) exacerbation: Secondary | ICD-10-CM | POA: Insufficient documentation

## 2013-05-29 MED ORDER — PREDNISONE 20 MG PO TABS: 20.0000 mg | ORAL_TABLET | Freq: Every day | ORAL | Status: DC

## 2013-05-29 MED ORDER — ALBUTEROL SULFATE (5 MG/ML) 0.5% IN NEBU
5.0000 mg | INHALATION_SOLUTION | RESPIRATORY_TRACT | Status: DC
  Administered 2013-05-29: 5 mg via RESPIRATORY_TRACT
  Filled 2013-05-29: qty 1

## 2013-05-29 MED ORDER — ALBUTEROL SULFATE (5 MG/ML) 0.5% IN NEBU
5.0000 mg | INHALATION_SOLUTION | Freq: Once | RESPIRATORY_TRACT | Status: AC
  Administered 2013-05-29: 5 mg via RESPIRATORY_TRACT
  Filled 2013-05-29: qty 1

## 2013-05-29 MED ORDER — BENZONATATE 200 MG PO CAPS: 200.0000 mg | ORAL_CAPSULE | Freq: Two times a day (BID) | ORAL | Status: DC | PRN

## 2013-05-29 MED ORDER — IPRATROPIUM BROMIDE 0.02 % IN SOLN
0.5000 mg | RESPIRATORY_TRACT | Status: DC
  Administered 2013-05-29: 0.5 mg via RESPIRATORY_TRACT
  Filled 2013-05-29: qty 2.5

## 2013-05-29 MED ORDER — ALBUTEROL SULFATE HFA 108 (90 BASE) MCG/ACT IN AERS
2.0000 | INHALATION_SPRAY | RESPIRATORY_TRACT | Status: DC | PRN
  Administered 2013-05-29: 2 via RESPIRATORY_TRACT
  Filled 2013-05-29: qty 6.7

## 2013-05-29 MED ORDER — AEROCHAMBER PLUS W/MASK MISC
Status: AC
  Administered 2013-05-29: 1
  Filled 2013-05-29: qty 1

## 2013-05-29 MED ORDER — PREDNISONE 20 MG PO TABS
60.0000 mg | ORAL_TABLET | Freq: Once | ORAL | Status: AC
  Administered 2013-05-29: 60 mg via ORAL
  Filled 2013-05-29: qty 3

## 2013-05-29 MED ORDER — AEROCHAMBER PLUS FLO-VU LARGE MISC
1.0000 | Freq: Once | Status: AC
  Filled 2013-05-29: qty 1

## 2013-05-29 NOTE — ED Notes (Signed)
PT. REPORTS PROGRESSING SOB WITH WHEEZING AND DRY COUGH FOR THE PAST 3 DAYS UNRELIEVED BY HOME NEBULIZER TREATMENT.

## 2013-05-29 NOTE — ED Provider Notes (Signed)
History    CSN: [id number] Arrival date & time 05/29/13  0030  First MD Initiated Contact with Patient 05/29/13 0049     Chief Complaint  Patient presents with  . Asthma   HPI Amanda Murillo is a 22 y.o. female presents with soreness of breath wheezing and dry cough for the past 3 days. Patient has been using her nebulizer at home with albuterol without relief. She says it occasionally worse when she is up moving around, it has gotten worse which is gone outside in hot weather. She's had some chest tightness, this does not radiate, it is worse when her wheezing is worse. Denies any history of venous thromboembolic disease, no productive cough, no fever, chills, no hemoptysis, no recent long periods of travel, no immobilization, or cancer treatment, no exogenous estrogen.  Past Medical History  Diagnosis Date  . Asthma   . 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 Trichomonas infection    Past Surgical History  Procedure Laterality Date  . No past surgeries     Family History  Problem Relation Age of Onset  . Anesthesia problems Neg Hx    History  Substance Use Topics  . Smoking status: Never Smoker   . Smokeless tobacco: Never Used  . Alcohol Use: No   OB History   Grav Para Term Preterm Abortions TAB SAB Ect Mult Living   3 3 3  0 0 0 0 0 0 2     Review of Systems At least 10pt or greater review of systems completed and are negative except where specified in the HPI.  Allergies  Review of patient's allergies indicates no known allergies.  Home Medications   Current Outpatient Rx  Name  Route  Sig  Dispense  Refill  . albuterol (2.5 MG/3ML) 0.083% NEBU 3 mL, albuterol (5 MG/ML) 0.5% NEBU 0.5 mL   Inhalation   Inhale into the lungs every 6 (six) hours as needed (for wheeze).         Marland Kitchen albuterol (PROVENTIL HFA;VENTOLIN HFA) 108 (90 BASE) MCG/ACT inhaler   Inhalation   Inhale 2 puffs into the lungs every 6 (six) hours as needed for wheezing.          BP 121/68  Pulse 115  Resp 26  SpO2 94%  Breastfeeding? No Physical Exam  Nursing notes reviewed.  Electronic medical record reviewed. VITAL SIGNS:   Filed Vitals:   05/29/13 0032  BP: 121/68  Pulse: 115  Resp: 26  SpO2: 94%   CONSTITUTIONAL: Awake, oriented, appears non-toxic HENT: Atraumatic, normocephalic, oral mucosa pink and moist, airway patent. Nares patent without drainage. External ears normal. EYES: Conjunctiva clear, EOMI, PERRLA NECK: Trachea midline, non-tender, supple CARDIOVASCULAR: Normal heart rate, Normal rhythm, No murmurs, rubs, gallops PULMONARY/CHEST: Fair air movement bilaterally, wheezing bilaterally. Extended expiratory phase. ABDOMINAL: Non-distended, obese, soft, non-tender - no rebound or guarding.  BS normal. NEUROLOGIC: Non-focal, moving all four extremities, no gross sensory or motor deficits. EXTREMITIES: No clubbing, cyanosis, or edema SKIN: Warm, Dry, No erythema, No rash  ED Course  Procedures (including critical care time) Labs Reviewed - No data to display Dg Chest 2 View  05/29/2013   *RADIOLOGY REPORT*  Clinical Data: Shortness of breath.  Cough for 3 days.  CHEST - 2 VIEW  Comparison: 05/02/2011  Findings: Pulmonary hyperinflation. The heart size and pulmonary vascularity are normal. The lungs appear clear and expanded without focal air space disease or consolidation. No blunting of  the costophrenic angles.  No pneumothorax.  Mediastinal contours appear intact. No significant change since previous study.  IMPRESSION: Hyperinflation.  No evidence of active pulmonary disease.   Original Report Authenticated By: Burman Nieves, M.D.   1. Mild asthma exacerbation   2. Shortness of breath     MDM  Patient arrives to the ER with a mild intermittent asthma exacerbation. This is been That they have home with some albuterol treatments however it's gotten worse this evening, we'll repeat albuterol as well as DuoNeb treatments. Will  start patient on steroid burst. Patient denies having any history of environmental allergies, it is likely that hot weather recently has exacerbated her symptoms. Patient has no URI symptoms, her chest x-ray is clear and do not see focal infiltrates or pneumothorax. She has a Wells score of 1.5 for tachycardia, but I do not suspect PE, giving her a less than 1.3% chance of PE in an ER population.  Patient is feeling much better after multiple breathing treatments. Likely that tachycardia secondary to albuterol administration. She's in no acute distress. She's still having some cough, we'll provide her with some Tessalon Perles for cough, prednisone for asthma exacerbation. Also provided her with a spacer to prevent future exacerbations or to help her breathing when she is out and cannot use her nebulizer machine.  Patient is discharged stable and in good condition, she understands accepts medical plan as it's been dictated, she returned to the emergency department for any worsening symptoms, and referred the patient to family medicine clinic.  Jones Skene, MD 05/29/13 [phone]

## 2013-06-15 ENCOUNTER — Emergency Department (HOSPITAL_COMMUNITY)
Admission: EM | Admit: 2013-06-15 | Discharge: 2013-06-16 | Disposition: A | Discharge status: Home / Self Care | Payer: Self-pay | Attending: Emergency Medicine | Admitting: Emergency Medicine

## 2013-06-15 ENCOUNTER — Encounter (HOSPITAL_COMMUNITY): Payer: Self-pay | Admitting: *Deleted

## 2013-06-15 ENCOUNTER — Emergency Department (HOSPITAL_COMMUNITY): Payer: Self-pay

## 2013-06-15 DIAGNOSIS — Z8709 Personal history of other diseases of the respiratory system: Secondary | ICD-10-CM | POA: Insufficient documentation

## 2013-06-15 DIAGNOSIS — J45901 Unspecified asthma with (acute) exacerbation: Secondary | ICD-10-CM | POA: Insufficient documentation

## 2013-06-15 DIAGNOSIS — J4541 Moderate persistent asthma with (acute) exacerbation: Secondary | ICD-10-CM

## 2013-06-15 DIAGNOSIS — E876 Hypokalemia: Secondary | ICD-10-CM | POA: Insufficient documentation

## 2013-06-15 DIAGNOSIS — R000 Tachycardia, unspecified: Secondary | ICD-10-CM | POA: Insufficient documentation

## 2013-06-15 DIAGNOSIS — Z3202 Encounter for pregnancy test, result negative: Secondary | ICD-10-CM | POA: Insufficient documentation

## 2013-06-15 DIAGNOSIS — Z8619 Personal history of other infectious and parasitic diseases: Secondary | ICD-10-CM | POA: Insufficient documentation

## 2013-06-15 HISTORY — DX: Bronchitis, not specified as acute or chronic: J40

## 2013-06-15 LAB — BASIC METABOLIC PANEL
BUN: 8 mg/dL (ref 6–23)
CO2: 23 mEq/L (ref 19–32)
Calcium: 9.1 mg/dL (ref 8.4–10.5)
Creatinine, Ser: 0.8 mg/dL (ref 0.50–1.10)
Glucose, Bld: 112 mg/dL — ABNORMAL HIGH (ref 70–99)

## 2013-06-15 MED ORDER — POTASSIUM CHLORIDE CRYS ER 20 MEQ PO TBCR
60.0000 meq | EXTENDED_RELEASE_TABLET | Freq: Once | ORAL | Status: AC
  Administered 2013-06-15: 60 meq via ORAL
  Filled 2013-06-15: qty 3

## 2013-06-15 MED ORDER — METHYLPREDNISOLONE SODIUM SUCC 125 MG IJ SOLR: 80.0000 mg | Freq: Once | INTRAMUSCULAR | Status: DC

## 2013-06-15 MED ORDER — SODIUM CHLORIDE 0.9 % IV BOLUS (SEPSIS)
1000.0000 mL | Freq: Once | INTRAVENOUS | Status: AC
  Administered 2013-06-15: 1000 mL via INTRAVENOUS

## 2013-06-15 MED ORDER — PREDNISONE 20 MG PO TABS: ORAL_TABLET | ORAL | Status: DC

## 2013-06-15 NOTE — ED Notes (Signed)
Pt states she noticed SOB/DOE when walking down the sidewalk and could catch her breath.

## 2013-06-15 NOTE — ED Provider Notes (Addendum)
CSN: [id number]     Arrival date & time 06/15/13  2110 History     First MD Initiated Contact with Patient 06/15/13 2112     No chief complaint on file.  (Consider location/radiation/quality/duration/timing/severity/associated sxs/prior Treatment) HPI Comments: 22 yo female with asthma hx, recently started birth control presents with dyspnea worsening for two days.  Feels more severe than usual asthma, minimal improvement with nebs.  Pt has had steroids/ neb treatments.  No heart issues.  No recent surgery or leg pain.  The history is provided by the patient.    Past Medical History  Diagnosis Date  . Asthma   . 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 Trichomonas infection   . Bronchitis    Past Surgical History  Procedure Laterality Date  . No past surgeries     Family History  Problem Relation Age of Onset  . Anesthesia problems Neg Hx    History  Substance Use Topics  . Smoking status: Never Smoker   . Smokeless tobacco: Never Used  . Alcohol Use: No   OB History   Grav Para Term Preterm Abortions TAB SAB Ect Mult Living   3 3 3  0 0 0 0 0 0 2     Review of Systems  Allergies  Review of patient's allergies indicates no known allergies.  Home Medications   Current Outpatient Rx  Name  Route  Sig  Dispense  Refill  . albuterol (PROVENTIL HFA;VENTOLIN HFA) 108 (90 BASE) MCG/ACT inhaler   Inhalation   Inhale 2 puffs into the lungs every 6 (six) hours as needed for wheezing.         Marland Kitchen albuterol (PROVENTIL) (2.5 MG/3ML) 0.083% nebulizer solution   Nebulization   Take 2.5 mg by nebulization every 6 (six) hours as needed for wheezing.          BP 110/61  Pulse 127  Temp(Src) 97.7 F (36.5 C) (Oral)  Resp 24  SpO2 100%  LMP 06/08/2013  Breastfeeding? No Physical Exam  Nursing note and vitals reviewed. Constitutional: She is oriented to person, place, and time. She appears well-developed and well-nourished.  HENT:  Head:  Normocephalic and atraumatic.  Mild dry mm  Eyes: Conjunctivae are normal. Right eye exhibits no discharge. Left eye exhibits no discharge.  Neck: Normal range of motion. Neck supple. No tracheal deviation present.  Cardiovascular: Regular rhythm.  Tachycardia present.   Pulmonary/Chest: Effort normal. She has wheezes (exp bilateral).  Abdominal: Soft. She exhibits no distension. There is no tenderness. There is no guarding.  Musculoskeletal: She exhibits no edema and no tenderness.  Neurological: She is alert and oriented to person, place, and time.  Skin: Skin is warm. No rash noted.  Psychiatric: She has a normal mood and affect.    ED Course   Procedures (including critical care time)  Labs Reviewed  BASIC METABOLIC PANEL - Abnormal; Notable for the following:    Potassium 3.0 (*)    Glucose, Bld 112 (*)    All other components within normal limits  D-DIMER, QUANTITATIVE  URINALYSIS, ROUTINE W REFLEX MICROSCOPIC  PREGNANCY, URINE   No results found. No diagnosis found.  MDM  Clinically asthma exac however since more severe than usual and recent birth control/ tachycardia D dimer for low risk PE. PO potassium given in ED- low from increased use of albuterol lately Dimer neg, no indication for CT.  Pt well appearing on two rechecks, no increased work of breathing  or O2 requirement.  HR elevated likely combination from nebs/ hydration and asthma.  DC discussed. CXR reviewed.  Patient signed out with plan to fup heart rate/ reassess after fluid bolus. Dg Chest 2 View  06/15/2013   *RADIOLOGY REPORT*  Clinical Data: Shortness of breath  CHEST - 2 VIEW  Comparison: 05/29/2013  Findings: Minimal leftward curvature centered at L1 noted. Cardiomediastinal silhouette is within normal limits. The lungs are clear. No pleural effusion.  No pneumothorax.  No acute osseous abnormality.  IMPRESSION: No acute cardiopulmonary process.   Original Report Authenticated By: Christiana Pellant, M.D.      Enid Skeens, MD 06/15/13 2841  Enid Skeens, MD 06/16/13 [phone]

## 2013-06-15 NOTE — ED Notes (Signed)
Productive Cough x 3 days with white phlegm. SOB with saturation of 95%, and inspiratory and expiratory wheezing x 4 lung fields. In route to ED, She has had 10 of Albuterol and 0.5 of Atrovent, Solu Medrol 125. Upon arrival, expriatory wheezing and saturation at 100% with treatment.

## 2013-06-15 NOTE — ED Notes (Signed)
Bed:WA20<BR> Expected date:<BR> Expected time:<BR> Means of arrival:<BR> Comments:<BR> EMS

## 2013-06-16 LAB — URINALYSIS, ROUTINE W REFLEX MICROSCOPIC
Bilirubin Urine: NEGATIVE
Glucose, UA: NEGATIVE mg/dL
Hgb urine dipstick: NEGATIVE
Ketones, ur: NEGATIVE mg/dL
Specific Gravity, Urine: 1.011 (ref 1.005–1.030)
pH: 5.5 (ref 5.0–8.0)

## 2013-06-16 LAB — URINE MICROSCOPIC-ADD ON

## 2013-06-16 NOTE — ED Notes (Signed)
Pt informed of HR in 100's. She desires to leave ED to get ride.

## 2013-06-22 ENCOUNTER — Emergency Department (HOSPITAL_COMMUNITY)
Admission: EM | Admit: 2013-06-22 | Discharge: 2013-06-22 | Disposition: A | Discharge status: Home / Self Care | Payer: Self-pay | Attending: Emergency Medicine | Admitting: Emergency Medicine

## 2013-06-22 DIAGNOSIS — J45909 Unspecified asthma, uncomplicated: Secondary | ICD-10-CM | POA: Insufficient documentation

## 2013-06-22 DIAGNOSIS — Z79899 Other long term (current) drug therapy: Secondary | ICD-10-CM | POA: Insufficient documentation

## 2013-06-22 DIAGNOSIS — Z8619 Personal history of other infectious and parasitic diseases: Secondary | ICD-10-CM | POA: Insufficient documentation

## 2013-06-22 MED ORDER — IPRATROPIUM BROMIDE 0.02 % IN SOLN
0.5000 mg | Freq: Once | RESPIRATORY_TRACT | Status: AC
  Administered 2013-06-22: 0.5 mg via RESPIRATORY_TRACT
  Filled 2013-06-22: qty 2.5

## 2013-06-22 MED ORDER — PREDNISONE 20 MG PO TABS: ORAL_TABLET | ORAL | Status: DC

## 2013-06-22 MED ORDER — ALBUTEROL SULFATE (5 MG/ML) 0.5% IN NEBU
5.0000 mg | INHALATION_SOLUTION | Freq: Once | RESPIRATORY_TRACT | Status: AC
  Administered 2013-06-22: 5 mg via RESPIRATORY_TRACT
  Filled 2013-06-22: qty 1

## 2013-06-22 MED ORDER — ALBUTEROL SULFATE (5 MG/ML) 0.5% IN NEBU: 5.0000 mg | INHALATION_SOLUTION | Freq: Once | RESPIRATORY_TRACT | Status: DC

## 2013-06-22 MED ORDER — IPRATROPIUM BROMIDE 0.02 % IN SOLN: 0.5000 mg | Freq: Once | RESPIRATORY_TRACT | Status: DC

## 2013-06-22 MED ORDER — PREDNISONE 20 MG PO TABS
40.0000 mg | ORAL_TABLET | Freq: Once | ORAL | Status: AC
  Administered 2013-06-22: 40 mg via ORAL
  Filled 2013-06-22: qty 2

## 2013-06-22 NOTE — ED Provider Notes (Signed)
  CSN: [id number]     Arrival date & time 06/22/13  2039 History     First MD Initiated Contact with Patient 06/22/13 2041     Chief Complaint  Patient presents with  . Asthma   (Consider location/radiation/quality/duration/timing/severity/associated sxs/prior Treatment) HPI.... patient with known asthma presents with asthma attack for several days. Patient has used her home albuterol with moderate relief. She's been on prednisone inthe past. Severity is mild to moderate. No fever, chills, productive cough.  Nothing makes symptoms better or worse.   Past Medical History  Diagnosis Date  . Asthma   . 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 Trichomonas infection   . Bronchitis    Past Surgical History  Procedure Laterality Date  . No past surgeries     Family History  Problem Relation Age of Onset  . Anesthesia problems Neg Hx    History  Substance Use Topics  . Smoking status: Never Smoker   . Smokeless tobacco: Never Used  . Alcohol Use: No   OB History   Grav Para Term Preterm Abortions TAB SAB Ect Mult Living   3 3 3  0 0 0 0 0 0 2     Review of Systems  All other systems reviewed and are negative.    Allergies  Review of patient's allergies indicates no known allergies.  Home Medications   Current Outpatient Rx  Name  Route  Sig  Dispense  Refill  . albuterol (PROVENTIL HFA;VENTOLIN HFA) 108 (90 BASE) MCG/ACT inhaler   Inhalation   Inhale 2 puffs into the lungs every 6 (six) hours as needed for wheezing.         Marland Kitchen albuterol (PROVENTIL) (2.5 MG/3ML) 0.083% nebulizer solution   Nebulization   Take 2.5 mg by nebulization every 6 (six) hours as needed for wheezing.         . predniSONE (DELTASONE) 20 MG tablet      2 tabs po daily x 4 days   8 tablet   0   . predniSONE (DELTASONE) 20 MG tablet      3 tabs po day one, then 2 po daily x 4 days   11 tablet   0    BP 140/71  Pulse 129  Temp(Src) 99.8 F (37.7 C) (Axillary)   SpO2 100%  LMP 06/08/2013 Physical Exam  Nursing note and vitals reviewed. Constitutional: She is oriented to person, place, and time. She appears well-developed and well-nourished.  HENT:  Head: Normocephalic and atraumatic.  Eyes: Conjunctivae and EOM are normal. Pupils are equal, round, and reactive to light.  Neck: Normal range of motion. Neck supple.  Cardiovascular: Normal rate, regular rhythm and normal heart sounds.   Pulmonary/Chest: Effort normal.  Bilateral expiratory wheeze  Abdominal: Soft. Bowel sounds are normal.  Musculoskeletal: Normal range of motion.  Neurological: She is alert and oriented to person, place, and time.  Skin: Skin is warm and dry.  Psychiatric: She has a normal mood and affect.    ED Course   Procedures (including critical care time)  Labs Reviewed - No data to display No results found. 1. Asthma     MDM  Patient feels better after albuterol Atrovent breathing treatment. Discharge meds prednisone.  Donnetta Hutching, MD 06/22/13 2229

## 2013-06-22 NOTE — ED Notes (Addendum)
Pt has had more frequent asthma attacks recently and is on steroids but also has a cough now. No AC at her house. Began having an asthma attack tonight and tried personal inhaler w/ no relief. 2 duo nebs and 125 solumedrol given in EMS truck. EMS reports expiratory wheezing on L side. LAC 20g.

## 2013-06-22 NOTE — ED Notes (Signed)
Bed: WA23 Expected date:  Expected time:  Means of arrival:  Comments: EMS 

## 2013-07-31 ENCOUNTER — Emergency Department (HOSPITAL_COMMUNITY)
Admission: EM | Admit: 2013-07-31 | Discharge: 2013-07-31 | Disposition: A | Discharge status: Home / Self Care | Payer: Self-pay | Attending: Emergency Medicine | Admitting: Emergency Medicine

## 2013-07-31 ENCOUNTER — Encounter (HOSPITAL_COMMUNITY): Payer: Self-pay | Admitting: Emergency Medicine

## 2013-07-31 DIAGNOSIS — J45909 Unspecified asthma, uncomplicated: Secondary | ICD-10-CM | POA: Insufficient documentation

## 2013-07-31 DIAGNOSIS — R1110 Vomiting, unspecified: Secondary | ICD-10-CM | POA: Insufficient documentation

## 2013-07-31 DIAGNOSIS — Z8709 Personal history of other diseases of the respiratory system: Secondary | ICD-10-CM | POA: Insufficient documentation

## 2013-07-31 DIAGNOSIS — Z79899 Other long term (current) drug therapy: Secondary | ICD-10-CM | POA: Insufficient documentation

## 2013-07-31 DIAGNOSIS — J069 Acute upper respiratory infection, unspecified: Secondary | ICD-10-CM | POA: Insufficient documentation

## 2013-07-31 DIAGNOSIS — R0602 Shortness of breath: Secondary | ICD-10-CM | POA: Insufficient documentation

## 2013-07-31 DIAGNOSIS — Z2089 Contact with and (suspected) exposure to other communicable diseases: Secondary | ICD-10-CM | POA: Insufficient documentation

## 2013-07-31 MED ORDER — ALBUTEROL SULFATE (5 MG/ML) 0.5% IN NEBU
INHALATION_SOLUTION | RESPIRATORY_TRACT | Status: AC
  Filled 2013-07-31: qty 1

## 2013-07-31 MED ORDER — ALBUTEROL SULFATE (5 MG/ML) 0.5% IN NEBU
2.5000 mg | INHALATION_SOLUTION | RESPIRATORY_TRACT | Status: DC | PRN
Start: 2013-07-31 — End: 2014-03-01

## 2013-07-31 MED ORDER — ALBUTEROL SULFATE (5 MG/ML) 0.5% IN NEBU
5.0000 mg | INHALATION_SOLUTION | Freq: Once | RESPIRATORY_TRACT | Status: AC
  Administered 2013-07-31: 5 mg via RESPIRATORY_TRACT

## 2013-07-31 MED ORDER — ALBUTEROL SULFATE HFA 108 (90 BASE) MCG/ACT IN AERS
2.0000 | INHALATION_SPRAY | RESPIRATORY_TRACT | Status: DC | PRN
  Administered 2013-07-31: 2 via RESPIRATORY_TRACT
  Filled 2013-07-31: qty 6.7

## 2013-07-31 NOTE — ED Provider Notes (Signed)
Medical screening examination/treatment/procedure(s) were performed by non-physician practitioner and as supervising physician I was immediately available for consultation/collaboration. Devoria Albe, MD, Armando Gang   Ward Givens, MD 07/31/13 [phone]

## 2013-07-31 NOTE — ED Notes (Signed)
Wheezing since friday

## 2013-07-31 NOTE — ED Provider Notes (Signed)
CSN: [id number]     Arrival date & time 07/31/13  4098 History  This chart was scribed for Junius Finner, PA, working with Ward Givens, MD by Blanchard Kelch, ED Scribe. This patient was seen in room TR07C/TR07C and the patient's care was started at 10:51 AM.     Chief Complaint  Patient presents with  . Wheezing    Patient is a 22 y.o. female presenting with wheezing. The history is provided by the patient. No language interpreter was used.  Wheezing Associated symptoms: cough and shortness of breath   Associated symptoms: no fever     HPI Comments: Amanda Murillo is a 22 y.o. female with a history of asthma who presents to the Emergency Department complaining of constant wheezing with shortness of breath and cough that began yesterday.  She had one episode of vomiting yesterday. She has been using her albuterol inhaler with mild relief every 30-45 minutes and reports inhaler indicates it is empty but still seems to work. She took a Cold and flu nyquil tablet yesterday with minimal relief. She denies fever, abdominal pain or diarrhea. She denies sick contacts or recent travel.    Past Medical History  Diagnosis Date  . Asthma   . 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 Trichomonas infection   . Bronchitis    Past Surgical History  Procedure Laterality Date  . No past surgeries     Family History  Problem Relation Age of Onset  . Anesthesia problems Neg Hx    History  Substance Use Topics  . Smoking status: Never Smoker   . Smokeless tobacco: Never Used  . Alcohol Use: No   OB History   Grav Para Term Preterm Abortions TAB SAB Ect Mult Living   3 3 3  0 0 0 0 0 0 2     Review of Systems  Constitutional: Negative for fever.  Respiratory: Positive for cough, shortness of breath and wheezing.   Gastrointestinal: Positive for vomiting. Negative for abdominal pain and diarrhea.  All other systems reviewed and are negative.    Allergies  Review of  patient's allergies indicates no known allergies.  Home Medications   Current Outpatient Rx  Name  Route  Sig  Dispense  Refill  . albuterol (PROVENTIL HFA;VENTOLIN HFA) 108 (90 BASE) MCG/ACT inhaler   Inhalation   Inhale 2 puffs into the lungs every 6 (six) hours as needed for wheezing.         . predniSONE (DELTASONE) 20 MG tablet      2 tabs po daily x 4 days   8 tablet   0   . albuterol (PROVENTIL) (2.5 MG/3ML) 0.083% nebulizer solution   Nebulization   Take 2.5 mg by nebulization every 6 (six) hours as needed for wheezing.         Marland Kitchen albuterol (PROVENTIL) (5 MG/ML) 0.5% nebulizer solution   Nebulization   Take 0.5 mLs (2.5 mg total) by nebulization every 4 (four) hours as needed for wheezing or shortness of breath.   20 mL   12    Triage Vitals: BP 120/72  Pulse 89  Temp(Src) 98.2 F (36.8 C)  Resp 22  SpO2 94%  Physical Exam  Nursing note and vitals reviewed. Constitutional: She is oriented to person, place, and time. She appears well-developed and well-nourished.  HENT:  Head: Normocephalic and atraumatic.  Right Ear: Hearing, tympanic membrane, external ear and ear canal normal.  Left Ear: Hearing,  tympanic membrane, external ear and ear canal normal.  Nose: Mucosal edema and rhinorrhea present. No sinus tenderness, nasal deformity or septal deviation.  Mouth/Throat: Uvula is midline and mucous membranes are normal. Posterior oropharyngeal erythema present. No oropharyngeal exudate, posterior oropharyngeal edema or tonsillar abscesses.  Eyes: EOM are normal.  Neck: Normal range of motion. Neck supple.  Cardiovascular: Normal rate, regular rhythm and normal heart sounds.   Pulmonary/Chest: Effort normal and breath sounds normal. No respiratory distress. She has no wheezes. She has no rales. She exhibits no tenderness.  Intermittent productive cough. No respiratory distress.  Abdominal: Soft. Bowel sounds are normal. She exhibits no distension. There is no  tenderness.  Musculoskeletal: Normal range of motion.  Neurological: She is alert and oriented to person, place, and time.  Skin: Skin is warm and dry.  Psychiatric: She has a normal mood and affect. Her behavior is normal.    ED Course  Procedures (including critical care time)  DIAGNOSTIC STUDIES: Oxygen Saturation is 94% on room air, adequate by my interpretation.    COORDINATION OF CARE:  10:51 AM -Will order breathing treatment. Patient verbalizes understanding and agrees with treatment plan.   Labs Review Labs Reviewed - No data to display Imaging Review No results found.  MDM   1. URI, acute   2. Asthma    Pt responded well to 1 albuterol nebulizer treatment during triage. Pt states she feels like she can breath better now. Upon PE, pt appears well, no respiratory distress. Intermittent productive cough, however lungs: CTAB. Do not believe CXR is needed at this time.  Will Rx albuterol inhaler and albuterol neb solution as pt states she has a machine at home but just recently acquired insurance that will allow her to fill solution Rx.  Encouraged rest and fluids. Return precautions given. Pt verbalized understanding and agreement with tx plan.   I personally performed the services described in this documentation, which was scribed in my presence. The recorded information has been reviewed and is accurate.   Junius Finner, PA-C 07/31/13 1244

## 2014-03-01 ENCOUNTER — Encounter (HOSPITAL_COMMUNITY): Payer: Self-pay | Admitting: Emergency Medicine

## 2014-03-01 ENCOUNTER — Emergency Department (HOSPITAL_COMMUNITY)
Admission: EM | Admit: 2014-03-01 | Discharge: 2014-03-01 | Disposition: A | Discharge status: Home / Self Care | Payer: Self-pay | Attending: Emergency Medicine | Admitting: Emergency Medicine

## 2014-03-01 DIAGNOSIS — J45909 Unspecified asthma, uncomplicated: Secondary | ICD-10-CM | POA: Insufficient documentation

## 2014-03-01 DIAGNOSIS — K089 Disorder of teeth and supporting structures, unspecified: Secondary | ICD-10-CM | POA: Insufficient documentation

## 2014-03-01 DIAGNOSIS — Z79899 Other long term (current) drug therapy: Secondary | ICD-10-CM | POA: Insufficient documentation

## 2014-03-01 DIAGNOSIS — IMO0002 Reserved for concepts with insufficient information to code with codable children: Secondary | ICD-10-CM | POA: Insufficient documentation

## 2014-03-01 DIAGNOSIS — K0889 Other specified disorders of teeth and supporting structures: Secondary | ICD-10-CM

## 2014-03-01 DIAGNOSIS — K029 Dental caries, unspecified: Secondary | ICD-10-CM | POA: Insufficient documentation

## 2014-03-01 DIAGNOSIS — Z8619 Personal history of other infectious and parasitic diseases: Secondary | ICD-10-CM | POA: Insufficient documentation

## 2014-03-01 MED ORDER — IBUPROFEN 800 MG PO TABS: 800.0000 mg | ORAL_TABLET | Freq: Three times a day (TID) | ORAL | Status: DC

## 2014-03-01 MED ORDER — PENICILLIN V POTASSIUM 500 MG PO TABS: 500.0000 mg | ORAL_TABLET | Freq: Four times a day (QID) | ORAL | Status: AC

## 2014-03-01 MED ORDER — HYDROCODONE-ACETAMINOPHEN 5-325 MG PO TABS
1.0000 | ORAL_TABLET | Freq: Once | ORAL | Status: AC
  Administered 2014-03-01: 1 via ORAL
  Filled 2014-03-01: qty 1

## 2014-03-01 MED ORDER — HYDROCODONE-ACETAMINOPHEN 5-325 MG PO TABS: 1.0000 | ORAL_TABLET | ORAL | Status: DC | PRN

## 2014-03-01 NOTE — Discharge Instructions (Signed)
Take Ibuprofen for mild-moderate pain  Take Vicodin for severe pain - Please be careful with this medication.  It can cause drowsiness.  Use caution while driving, operating machinery, drinking alcohol, or any other activities that may impair your physical or mental abilities.   Take antibiotics and follow-up with a doctor as soon as possible Return to the emergency department if you develop any changing/worsening condition, fever, neck swelling, or any other concerns (please read additional information regarding your condition below)  Dental Pain A tooth ache may be caused by cavities (tooth decay). Cavities expose the nerve of the tooth to air and hot or cold temperatures. It may come from an infection or abscess (also called a boil or furuncle) around your tooth. It is also often caused by dental caries (tooth decay). This causes the pain you are having. DIAGNOSIS  Your caregiver can diagnose this problem by exam. TREATMENT   If caused by an infection, it may be treated with medications which kill germs (antibiotics) and pain medications as prescribed by your caregiver. Take medications as directed.  Only take over-the-counter or prescription medicines for pain, discomfort, or fever as directed by your caregiver.  Whether the tooth ache today is caused by infection or dental disease, you should see your dentist as soon as possible for further care. SEEK MEDICAL CARE IF: The exam and treatment you received today has been provided on an emergency basis only. This is not a substitute for complete medical or dental care. If your problem worsens or new problems (symptoms) appear, and you are unable to meet with your dentist, call or return to this location. SEEK IMMEDIATE MEDICAL CARE IF:   You have a fever.  You develop redness and swelling of your face, jaw, or neck.  You are unable to open your mouth.  You have severe pain uncontrolled by pain medicine. MAKE SURE YOU:   Understand these  instructions.  Will watch your condition.  Will get help right away if you are not doing well or get worse. Document Released: 10/23/2005 Document Revised: 01/15/2012 Document Reviewed: 06/10/2008 [facility] Patient Information 2014 [place], [place].   Emergency Department Resource Guide 1) Find a Doctor and Pay Out of Pocket Although you won't have to find out who is covered by your insurance plan, it is a good idea to ask around and get recommendations. You will then need to call the office and see if the doctor you have chosen will accept you as a new patient and what types of options they offer for patients who are self-pay. Some doctors offer discounts or will set up payment plans for their patients who do not have insurance, but you will need to ask so you aren't surprised when you get to your appointment.  2) Contact Your Local Health Department Not all health departments have doctors that can see patients for sick visits, but many do, so it is worth a call to see if yours does. If you don't know where your local health department is, you can check in your phone book. The CDC also has a tool to help you locate your state's health department, and many state websites also have listings of all of their local health departments.  3) Find a Walk-in Clinic If your illness is not likely to be very severe or complicated, you may want to try a walk in clinic. These are popping up all over the country in pharmacies, drugstores, and shopping centers. They're usually staffed by nurse practitioners or  physician assistants that have been trained to treat common illnesses and complaints. They're usually fairly quick and inexpensive. However, if you have serious medical issues or chronic medical problems, these are probably not your best option.  No Primary Care Doctor: - Call Health Connect at  [phone] - they can help you locate a primary care doctor that  accepts your insurance, provides certain services,  etc. - Physician Referral Service- [phone]  Chronic Pain Problems: Organization         Address  Phone   Notes  [facility]  [phone] Patients need to be referred by their primary care doctor.   Medication Assistance: Organization         Address  Phone   Notes  [facility] Medication [facility] [address] [place] [place], [place] [address] [phone] --Must be a resident of [facility] -- Must have NO insurance coverage whatsoever (no Medicaid/ Medicare, etc.) -- The pt. MUST have a primary care doctor that directs their care regularly and follows them in the community   [facility]ist  [phone]   [employer]  [phone]    Agencies that provide inexpensive medical care: Organization         Address  Phone   Notes  [facility]  [phone]   [facility]    [phone]   [facility] [address] [place], [place] [address] [phone]   [facility] [address] [place], [place] [phone]   Planned Parenthood    [phone]   [facility]    [phone]   Community Health and [facility]  [address], [place] Phone:  [phone], Fax:  [phone] Hours of Operation:  9 am - 6 pm, M-F.  Also accepts Medicaid/Medicare and self-pay.  [facility]  [address], [place] Phone: [phone], Fax: [phone]. Hours of Operation:  8:30 am - 5:30 pm, M-F.  Also accepts Medicaid and self-pay.  [facility] [place] [address], [place] Phone: [phone]   [facility] [address] Natasha Bence [place], [place] [phone], Ext. [phone] Mondays & Thursdays: 7-9 AM.  First 15 patients are seen on a first come, first serve basis.    Medicaid-accepting [facility] Providers:  Organization         Address  Phone   Notes  [facility] [address],  [phone] Also accepts self-pay patients.  [facility] [address] Laurell Josephs [place], [place]  [phone]   [facility] [address], [place] [phone]   [facility] [address] [place] [phone]   Renaye Rakers [address], [place]   [phone] Only accepts [place] patients after they have their name applied to their card.   Self-Pay (no insurance) in [facility]:  Organization         Address  Phone   Notes  Sickle Cell Patients, [facility] [address] [place], [place] [phone]   [facility] [address] [place], [place] [phone]   [facility] [place]  [address], [place] [phone]   [facility]/Dr. Julio Sicks  [address] [place]  [place] [address], [place] or [address], [place] [phone] Phone number for both [place] and [place] locations is the same.  Urgent Medical and [facility] [address], [place] [phone]) [phone]   [facility] [place] [address] [place] [address], [place] or [address] [phone]   [facility] [address], [place] [phone]) [phone], phone; [phone](336) [phone], fax Sees patients 1st and 3rd Saturday of every month.  Must not qualify for public or private insurance (i.e. Medicaid, Medicare, [place] Health Choice, Veterans' Benefits)  Household income should be no more than 200% of the poverty level The clinic cannot treat you if you are pregnant or think you are pregnant  Sexually transmitted diseases are not treated at the clinic.    Dental Care: Organization         Address  Phone  Notes  [facility] [address], [place] [phone]) [phone] Accepts children up to  age 25 who are enrolled in [place]icaid or [place] Health Choice; pregnant women with a Medicaid card; and children who have applied for Medicaid or [place] Health Choice, but were declined, whose parents can pay a reduced fee at time of service.  [facility] [place]  [address], [place] [phone] Accepts children up to age 25 who are enrolled in [place] or [place] Health Choice; pregnant women with a Medicaid card; and children who have applied for Medicaid or [place] Health Choice, but were declined, whose parents can pay a reduced fee at time of service.  [facility]  [address], [place] [phone] Patients are seen by appointment only. Walk-ins are not accepted. [facility] will see patients 25 years of age and older. Monday - Tuesday (8am-5pm) Most Wednesdays (8:30-5pm) $30 per visit, cash only  [facility]  [address], [facility] [phone] Patients are seen by appointment only. Walk-ins are not accepted. [facility] will see patients 25 years of age and older. One Wednesday Evening (Monthly: Volunteer Based).  $30 per visit, cash only  [employer] [facility] [facility]  [phone]) [phone] for adults; Children under age 424, call Graduate Pediatric Dentistry at [phone]. Children aged 454-14, please call [phone]) [phone] to request a pediatric application.  Dental services are provided in all areas of dental care including fillings, crowns and bridges, complete and partial dentures, implants, gum treatment, root canals, and extractions. Preventive care is also provided. Treatment is provided to both adults and children. Patients are selected via a lottery and there is often a waiting list.   [facility] [address], [place]  [phone] [email or web address]   [facility] [address], [place], [place] [phone], Ext. [phone] Second and Fourth Thursday of  each month, opens at 6:30 AM; Clinic ends at 9 AM.  Patients are seen on a first-come first-served basis, and a limited number are seen during each clinic.   [facility]  [address], [place], [place] [phone]) [phone]   Eligibility Requirements You must have lived in [place], [place], or [place] counties for at least the last three months.   You cannot be eligible for state or federal sponsored National Cityhealthcare insurance, including CIGNAVeterans Administration, [place]icaid, or [employer]icare.   You generally cannot be eligible for healthcare insurance through your employer.    How  to apply: Eligibility screenings are held every Tuesday and Wednesday afternoon from 1:00 pm until 4:00 pm. You do not need an appointment for the interview!  [facility] [address], [place], [place] [phone]   [facility]  [phone]   [facility]  [phone]   [facility]  [phone]    Behavioral Health Resources in the Community: Intensive Outpatient Programs Organization         Address  Phone  Notes  [facility] [address], [place], [place] [phone]   [facility]patient [address], [place], [place] [phone]   [facility] [address], [place], [place]  [phone]   [facility] [address],  [place], [place] [phone] or [phone]   Substance Abuse Resources Organization         Address  Phone  Notes  [facility] Drug Services  [phone]   [facility]  [phone]   [facility]  [phone]   [facility]  [phone]   Residential & Outpatient Substance Abuse Program  [phone]   Psychological Services Organization         Address  Phone  Notes  [facility]  [phone]- [phone]   [facility]  [phone]- [phone]   [facility] [address],  [place] [phone] or [phone]    Mobile Crisis Teams Organization         Address  Phone  Notes  [facility], Mobile Crisis Care Unit  [phone]   [facility]  [address] [place], [place] [phone]   Doristine LocksSharon DeEsch [address] [place] [phone]    Self-Help/Support Groups Organization         Address  Phone             Notes  [facility] [place] - variety of support groups  336- [phone] Call for more information  Narcotics Anonymous (NA), [facility] [address], [place]  2 meetings at this location   Statisticianesidential Treatment Programs Organization         Address  Phone  Notes  [facility] [address],    [place]  [phone]   [facility]  [address], [place] [phone], [place], [place] [phone]   [facility] [address], [place] [phone] Admissions: 8am-3pm M-F  [facility] [address]    [place], [place] [phone]   [facility] [address], [place], [place] [phone]   [facility] [address]  [place], [place] [phone]   [facility] - Intensive Outpatient [address], [place], [place] [phone]   [facility] ([facility] [address]  [place], [place] [phone] or [phone]   [facility] ([facility]) [address] [place], [place] [phone] Accepts Medicaid  [facility] [address]  [place] [phone] Substance Abuse/Addiction Treatment   [facility] Resources Organization         Address  Phone  Notes  [facility]  [phone]) [phone]   Angie FavaJulie Brannon, PhD [address], [address] [place], [place]   [phone] or [phone](336) [phone]   [facility]   [address] [place], [place] [phone](336) [phone]     [facility] [address], [place], [place] [phone] Insurance/Medicaid/sponsorship through [facility]  °[facility] [address]                                    [place], [place] [phone] Therapy/tele-psych/case  °[facility] [address]  ° [place], [place] [phone]    °Dr. Arfeen  ([phone]   °[facility] [place]  [employer] [place] [facility] 1) [address], [place] °2) [address], [place] °3)  [address], [place] [phone]) [phone] °([phone]) [phone] ° °([phone]) [phone]   °[place] Child Abuse Hotline (336) [phone] or (336) [phone] (After Hours)    ° ° ° °

## 2014-03-01 NOTE — ED Notes (Signed)
Onset 2 days ago upper left gum swelling and pain.  Swelling to left side of face.  Pt taking Ibuprofen for pain, with some relief.  No broken teeth or cavities that pt is aware of.

## 2014-03-01 NOTE — ED Notes (Signed)
She c/o L sided dental pain for past few days

## 2014-03-01 NOTE — ED Provider Notes (Signed)
CSN: [id number]     Arrival date & time 03/01/14  1159 History  This chart was scribed for non-physician practitioner, Coral CeoJessica Ariel Dimitri, PA-C working with Geoffery Lyonsouglas Delo, MD by Greggory StallionKayla Andersen, ED scribe. This patient was seen in room TR04C/TR04C and the patient's care was started at 1:57 PM.   Chief Complaint  Patient presents with  . Dental Pain   The history is provided by the patient. No language interpreter was used.   HPI Comments: Amanda Murillo is a 23 y.o. female with a PMH of asthma who presents to the Emergency Department complaining of gradual onset constant left upper dental pain that started 2 days ago. Denies injury or trauma to teeth. She states a filling came out of one of her lower teeth. Palpation worsens the pain. Pt has taken ibuprofen with little relief. Denies fever, nausea, emesis. Denies cigarette use. LNMP was 02/13/14.  Past Medical History  Diagnosis Date  . Asthma   . 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 Trichomonas infection   . Bronchitis    Past Surgical History  Procedure Laterality Date  . No past surgeries     Family History  Problem Relation Age of Onset  . Anesthesia problems Neg Hx    History  Substance Use Topics  . Smoking status: Never Smoker   . Smokeless tobacco: Never Used  . Alcohol Use: No   OB History   Grav Para Term Preterm Abortions TAB SAB Ect Mult Living   3 3 3  0 0 0 0 0 0 2     Review of Systems  Constitutional: Negative for fever, chills, activity change, appetite change and fatigue.  HENT: Positive for dental problem. Negative for facial swelling.   Gastrointestinal: Negative for nausea and vomiting.  Musculoskeletal: Negative for neck pain and neck stiffness.  All other systems reviewed and are negative.   Allergies  Review of patient's allergies indicates no known allergies.  Home Medications   Prior to Admission medications   Medication Sig Start Date End Date Taking? Authorizing  Provider  albuterol (PROVENTIL HFA;VENTOLIN HFA) 108 (90 BASE) MCG/ACT inhaler Inhale 2 puffs into the lungs every 6 (six) hours as needed for wheezing.    Historical Provider, MD  albuterol (PROVENTIL) (2.5 MG/3ML) 0.083% nebulizer solution Take 2.5 mg by nebulization every 6 (six) hours as needed for wheezing.    Historical Provider, MD  albuterol (PROVENTIL) (5 MG/ML) 0.5% nebulizer solution Take 0.5 mLs (2.5 mg total) by nebulization every 4 (four) hours as needed for wheezing or shortness of breath. 07/31/13   Junius FinnerErin O'Malley, PA-C  predniSONE (DELTASONE) 20 MG tablet 2 tabs po daily x 4 days 06/15/13   Enid SkeensJoshua M Zavitz, MD   BP 102/67  Temp(Src) 98.5 F (36.9 C) (Oral)  Resp 14  Ht 4\' 11"  (1.499 m)  Wt 141 lb 8 oz (64.184 kg)  BMI 28.56 kg/m2  SpO2 96%  Filed Vitals:   03/01/14 1207 03/01/14 1407  BP: 102/67 106/70  Pulse:  72  Temp: 98.5 F (36.9 C)   TempSrc: Oral   Resp: 14   Height: 4\' 11"  (1.499 m)   Weight: 141 lb 8 oz (64.184 kg)   SpO2: 96%     Physical Exam  Nursing note and vitals reviewed. Constitutional: She is oriented to person, place, and time. She appears well-developed and well-nourished. No distress.  Non-toxic  HENT:  Head: Normocephalic and atraumatic.  Right Ear: External ear normal.  Left  Ear: External ear normal.  Nose: Nose normal.  Mouth/Throat: Oropharynx is clear and moist.    Dental cary and tenderness to the left upper 2nd pre-molar. Poor dentition throughout. No evidence of a gingival abscess. No erythema to the posterior pharynx. Tonsils without edema or exudates. Uvula midline. No trismus. No difficulty controlling secretions. Tympanic membranes gray and translucent bilaterally with no erythema, edema, or hemotympanum.  No mastoid or tragal tenderness bilaterally.   Eyes: Conjunctivae and EOM are normal. Right eye exhibits no discharge. Left eye exhibits no discharge.  Neck: Neck supple. No tracheal deviation present.  No cervical  lymphadenopathy. No nuchal rigidity. No submental fullness.  Cardiovascular: Normal rate.   Pulmonary/Chest: Effort normal. No respiratory distress.  Musculoskeletal: Normal range of motion.  Neurological: She is alert and oriented to person, place, and time.  Skin: Skin is warm and dry. She is not diaphoretic.  Psychiatric: She has a normal mood and affect. Her behavior is normal.    ED Course  Procedures (including critical care time)  DIAGNOSTIC STUDIES: Oxygen Saturation is 96% on RA, normal by my interpretation.    COORDINATION OF CARE: 1:59 PM-Discussed treatment plan which includes an antibiotic and pain medication with pt at bedside and pt agreed to plan. Will give pt dental referrals and advised her to follow up.   Labs Review Labs Reviewed - No data to display  Imaging Review No results found.   EKG Interpretation None      MDM   Amanda Murillo is a 23 y.o. female with a PMH of asthma who presents to the Emergency Department complaining of gradual onset constant left upper dental pain that started 2 days ago. Etiology of dental pain likely due to dental caries. No concerning signs/symptoms for Ludwig's Angina at this time.  Patient afebrile and non-toxic in appearance. Patient instructed to follow-up with a dentist for further evaluation and management. Resources provided. Return precautions were given.  Patient in agreement with discharge and plan.     Discharge Medication List as of 03/01/2014  2:03 PM    START taking these medications   Details  HYDROcodone-acetaminophen (NORCO/VICODIN) 5-325 MG per tablet Take 1 tablet by mouth every 4 (four) hours as needed., Starting 03/01/2014, Until Discontinued, Print    ibuprofen (ADVIL,MOTRIN) 800 MG tablet Take 1 tablet (800 mg total) by mouth 3 (three) times daily., Starting 03/01/2014, Until Discontinued, Print    penicillin v potassium (VEETID) 500 MG tablet Take 1 tablet (500 mg total) by mouth 4 (four) times daily.,  Starting 03/01/2014, Last dose on Sun 03/08/14, Print         Final impressions: 1. Pain, dental       Thomasenia SalesJessica Katlin Treena Cosman PA-C   I personally performed the services described in this documentation, which was scribed in my presence. The recorded information has been reviewed and is accurate.  Jillyn LedgerJessica K Clearence Vitug, PA-C 03/01/14 2241

## 2014-03-03 NOTE — ED Provider Notes (Signed)
Medical screening examination/treatment/procedure(s) were performed by non-physician practitioner and as supervising physician I was immediately available for consultation/collaboration.     Edd Reppert, MD 03/03/14 1408 

## 2014-04-14 ENCOUNTER — Encounter (HOSPITAL_COMMUNITY): Payer: Self-pay | Admitting: Emergency Medicine

## 2014-04-14 ENCOUNTER — Emergency Department (HOSPITAL_COMMUNITY)
Admission: EM | Admit: 2014-04-14 | Discharge: 2014-04-14 | Disposition: A | Discharge status: Home / Self Care | Payer: Self-pay | Attending: Emergency Medicine | Admitting: Emergency Medicine

## 2014-04-14 ENCOUNTER — Emergency Department (HOSPITAL_COMMUNITY): Payer: Self-pay

## 2014-04-14 DIAGNOSIS — J45901 Unspecified asthma with (acute) exacerbation: Secondary | ICD-10-CM | POA: Insufficient documentation

## 2014-04-14 DIAGNOSIS — Z79899 Other long term (current) drug therapy: Secondary | ICD-10-CM | POA: Insufficient documentation

## 2014-04-14 DIAGNOSIS — J40 Bronchitis, not specified as acute or chronic: Secondary | ICD-10-CM | POA: Insufficient documentation

## 2014-04-14 DIAGNOSIS — A599 Trichomoniasis, unspecified: Secondary | ICD-10-CM | POA: Insufficient documentation

## 2014-04-14 DIAGNOSIS — R05 Cough: Secondary | ICD-10-CM

## 2014-04-14 DIAGNOSIS — J3489 Other specified disorders of nose and nasal sinuses: Secondary | ICD-10-CM | POA: Insufficient documentation

## 2014-04-14 DIAGNOSIS — R6889 Other general symptoms and signs: Secondary | ICD-10-CM | POA: Insufficient documentation

## 2014-04-14 DIAGNOSIS — R1110 Vomiting, unspecified: Secondary | ICD-10-CM | POA: Insufficient documentation

## 2014-04-14 DIAGNOSIS — R059 Cough, unspecified: Secondary | ICD-10-CM

## 2014-04-14 MED ORDER — BENZONATATE 100 MG PO CAPS: 100.0000 mg | ORAL_CAPSULE | Freq: Three times a day (TID) | ORAL | Status: DC

## 2014-04-14 MED ORDER — HYDROCOD POLST-CHLORPHEN POLST 10-8 MG/5ML PO LQCR: 5.0000 mL | Freq: Two times a day (BID) | ORAL | Status: DC | PRN

## 2014-04-14 MED ORDER — IPRATROPIUM-ALBUTEROL 0.5-2.5 (3) MG/3ML IN SOLN
3.0000 mL | Freq: Once | RESPIRATORY_TRACT | Status: AC
  Administered 2014-04-14: 3 mL via RESPIRATORY_TRACT
  Filled 2014-04-14: qty 3

## 2014-04-14 MED ORDER — ALBUTEROL SULFATE HFA 108 (90 BASE) MCG/ACT IN AERS
2.0000 | INHALATION_SPRAY | RESPIRATORY_TRACT | Status: DC | PRN
  Administered 2014-04-14: 2 via RESPIRATORY_TRACT
  Filled 2014-04-14: qty 6.7

## 2014-04-14 MED ORDER — PREDNISONE 20 MG PO TABS
60.0000 mg | ORAL_TABLET | Freq: Once | ORAL | Status: AC
  Administered 2014-04-14: 60 mg via ORAL
  Filled 2014-04-14: qty 3

## 2014-04-14 MED ORDER — IPRATROPIUM-ALBUTEROL 0.5-2.5 (3) MG/3ML IN SOLN
3.0000 mL | Freq: Once | RESPIRATORY_TRACT | Status: AC
Start: 2014-04-14 — End: 2014-04-14
  Administered 2014-04-14: 3 mL via RESPIRATORY_TRACT
  Filled 2014-04-14: qty 3

## 2014-04-14 MED ORDER — PREDNISONE 20 MG PO TABS: 40.0000 mg | ORAL_TABLET | Freq: Every day | ORAL | Status: DC

## 2014-04-14 MED ORDER — HYDROCOD POLST-CHLORPHEN POLST 10-8 MG/5ML PO LQCR
5.0000 mL | Freq: Once | ORAL | Status: AC
  Administered 2014-04-14: 5 mL via ORAL
  Filled 2014-04-14: qty 5

## 2014-04-14 NOTE — ED Notes (Signed)
Josh, PA at bedside. °

## 2014-04-14 NOTE — ED Notes (Signed)
Pt alert x4 respirations easy non labored.  

## 2014-04-14 NOTE — Discharge Instructions (Signed)
Please read and follow all provided instructions.  Your diagnoses today include:  1. Cough   2. Asthma exacerbation     Tests performed today include:  Chest x-ray - no pneumonia  Vital signs. See below for your results today.   Medications prescribed:   Albuterol inhaler - medication that opens up your airway  Use inhaler as follows: 1-2 puffs with spacer every 4 hours as needed for wheezing, cough, or shortness of breath.    Prednisone - steroid medicine   It is best to take this medication in the morning to prevent sleeping problems. If you are diabetic, monitor your blood sugar closely and stop taking Prednisone if blood sugar is over 300. Take with food to prevent stomach upset.    Tessalon Perles - cough suppressant medication   Tussinex - narcotic cough suppressant syrup  You have been prescribed narcotic cough suppressant such as Tussinex: DO NOT drive or perform any activities that require you to be awake and alert because this medicine can make you drowsy.   Take any prescribed medications only as directed.  Home care instructions:  Follow any educational materials contained in this packet.  Follow-up instructions: Please follow-up with your primary care provider in the next 3 days for further evaluation of your symptoms and management of your asthma. If you do not have a primary care doctor -- see below for referral information.   Return instructions:   Please return to the Emergency Department if you experience worsening symptoms.  Please return with worsening wheezing, shortness of breath, or difficulty breathing.  Return with persistent fever above 101F.   Please return if you have any other emergent concerns.  Additional Information:  Your vital signs today were: BP 91/50   Pulse 103   Temp(Src) 98.6 F (37 C) (Oral)   Resp 19   Ht 4\' 11"  (1.499 m)   Wt 140 lb (63.504 kg)   BMI 28.26 kg/m2   SpO2 99%   LMP 04/01/2014 If your blood pressure (BP)  was elevated above 135/85 this visit, please have this repeated by your doctor within one month. --------------

## 2014-04-14 NOTE — ED Notes (Addendum)
She c/o cough and SOB since last night. She reports a history of asthma and states she is having a "flare up." she tried her nebs at home with no relief. She is not in pain

## 2014-04-14 NOTE — ED Provider Notes (Signed)
CSN: [id number]     Arrival date & time 04/14/14  1540 History   First MD Initiated Contact with Patient 04/14/14 1559     Chief Complaint  Patient presents with  . Cough     (Consider location/radiation/quality/duration/timing/severity/associated sxs/prior Treatment) HPI Comments: Patient with history of asthma presents with complaint of worsening wheezing and shortness of breath as well as cough for the past one day. Patient has used her albuterol inhaler at home frequently without relief. She has had a runny nose but no fever, chest pain. Patient has had 2 episodes of posttussive emesis. No diarrhea or urinary symptoms. No other medical complaints at this time. Patient denies risk factors for pulmonary embolism including: unilateral leg swelling, history of DVT/PE/other blood clots, use of estrogens, recent immobilizations, recent surgery, recent travel (>4hr segment), malignancy, hemoptysis. No previous hospitalizations for asthma.    Patient is a 23 y.o. female presenting with cough. The history is provided by the patient.  Cough Associated symptoms: rhinorrhea   Associated symptoms: no chest pain, no fever, no headaches, no myalgias, no rash and no sore throat     Past Medical History  Diagnosis Date  . Asthma   . 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 Trichomonas infection   . Bronchitis    Past Surgical History  Procedure Laterality Date  . No past surgeries     Family History  Problem Relation Age of Onset  . Anesthesia problems Neg Hx    History  Substance Use Topics  . Smoking status: Never Smoker   . Smokeless tobacco: Never Used  . Alcohol Use: No   OB History   Grav Para Term Preterm Abortions TAB SAB Ect Mult Living   3 3 3  0 0 0 0 0 0 2     Review of Systems  Constitutional: Negative for fever.  HENT: Positive for congestion and rhinorrhea. Negative for sore throat.   Eyes: Negative for redness.  Respiratory: Positive for cough.    Cardiovascular: Negative for chest pain.  Gastrointestinal: Positive for vomiting. Negative for nausea, abdominal pain and diarrhea.  Genitourinary: Negative for dysuria.  Musculoskeletal: Negative for myalgias.  Skin: Negative for rash.  Neurological: Negative for headaches.      Allergies  Review of patient's allergies indicates no known allergies.  Home Medications   Prior to Admission medications   Medication Sig Start Date End Date Taking? Authorizing Provider  albuterol (PROVENTIL HFA;VENTOLIN HFA) 108 (90 BASE) MCG/ACT inhaler Inhale 2 puffs into the lungs every 6 (six) hours as needed for wheezing.    Historical Provider, MD  albuterol (PROVENTIL) (2.5 MG/3ML) 0.083% nebulizer solution Take 2.5 mg by nebulization every 6 (six) hours as needed for wheezing.    Historical Provider, MD  HYDROcodone-acetaminophen (NORCO/VICODIN) 5-325 MG per tablet Take 1 tablet by mouth every 4 (four) hours as needed. 03/01/14   Jillyn Ledger, PA-C  ibuprofen (ADVIL,MOTRIN) 800 MG tablet Take 1 tablet (800 mg total) by mouth 3 (three) times daily. 03/01/14   Janene Harvey Palmer, PA-C   BP 139/101  Pulse 130  Temp(Src) 98.1 F (36.7 C) (Oral)  Resp 26  Ht 4\' 11"  (1.499 m)  Wt 140 lb (63.504 kg)  BMI 28.26 kg/m2  SpO2 92%  Physical Exam  Nursing note and vitals reviewed. Constitutional: She appears well-developed and well-nourished.  HENT:  Head: Normocephalic and atraumatic.  Eyes: Conjunctivae are normal. Right eye exhibits no discharge. Left eye exhibits no discharge.  Neck: Normal range of motion. Neck supple.  Cardiovascular: Normal rate, regular rhythm and normal heart sounds.   No murmur heard. Pulmonary/Chest: Effort normal and breath sounds normal. No respiratory distress. She has no wheezes. She has no rales.  Expiratory Wheezing noted bilateral bases, left greater than right.  Abdominal: Soft. There is no tenderness.  Neurological: She is alert.  Skin: Skin is warm and dry.   Psychiatric: She has a normal mood and affect.    ED Course  Procedures (including critical care time) Labs Review Labs Reviewed - No data to display  Imaging Review No results found.   EKG Interpretation None      4:13 PM Patient seen and examined. Work-up initiated. Medications ordered. Patient received some relief with nebulizer treatment given prior to exam.  Vital signs reviewed and are as follows: Filed Vitals:   04/14/14 1541  BP: 139/101  Pulse: 130  Temp: 98.1 F (36.7 C)  Resp: 26   Breathing improved, wheezing result (second treatment. Patient continues to have a lot of coughing. Will discharge home with cough suppressant medications. I've also given her an additional inhaler and she has run out of her home inhaler.  Primary care referral given. Patient is concerned that her home living conditions are causing her asthma flares and she needs to consistent provider to help her manage this.  BP 100/53  Pulse 97  Temp(Src) 98.6 F (37 C) (Oral)  Resp 18  Ht 4\' 11"  (1.499 m)  Wt 140 lb (63.504 kg)  BMI 28.26 kg/m2  SpO2 100%  LMP 04/01/2014  Patient encouraged to return with worsening breathing, fever, other concerns. Patient verbalizes understanding and agrees with plan.   Patient counseled on use of narcotic cough  medications. Counseled not to combine these medications with others containing tylenol. Urged not to drink alcohol, drive, or perform any other activities that requires focus while taking these medications. The patient verbalizes understanding and agrees with the plan.   MDM   Final diagnoses:  Cough  Asthma exacerbation   Patient with cough and increased wheezing. Symptoms improved in emergency department for treatment. Will give short course of prednisone as well as cough suppressant medications. Chest x-ray is clear. No fever to suggest pneumonia. No chest pain or other concerning symptoms for pulmonary embolism.   Renne CriglerJoshua Kenya Shiraishi,  PA-C 04/14/14 1955

## 2014-04-15 NOTE — Discharge Planning (Signed)
[facility] Liaison was not able to see patient, [facility] orange card information and resource guide will be mailed to the address listed. °

## 2014-04-15 NOTE — ED Provider Notes (Signed)
Medical screening examination/treatment/procedure(s) were performed by non-physician practitioner and as supervising physician I was immediately available for consultation/collaboration.   EKG Interpretation None        Neri Vieyra L Lillith Mcneff, MD 04/15/14 2050 

## 2014-05-14 ENCOUNTER — Encounter (HOSPITAL_COMMUNITY): Payer: Self-pay | Admitting: Emergency Medicine

## 2014-05-14 ENCOUNTER — Emergency Department (HOSPITAL_COMMUNITY)
Admission: EM | Admit: 2014-05-14 | Discharge: 2014-05-14 | Disposition: A | Discharge status: Home / Self Care | Payer: Self-pay | Attending: Emergency Medicine | Admitting: Emergency Medicine

## 2014-05-14 DIAGNOSIS — Z8619 Personal history of other infectious and parasitic diseases: Secondary | ICD-10-CM | POA: Insufficient documentation

## 2014-05-14 DIAGNOSIS — J4521 Mild intermittent asthma with (acute) exacerbation: Secondary | ICD-10-CM

## 2014-05-14 DIAGNOSIS — J45901 Unspecified asthma with (acute) exacerbation: Secondary | ICD-10-CM | POA: Insufficient documentation

## 2014-05-14 MED ORDER — ALBUTEROL SULFATE HFA 108 (90 BASE) MCG/ACT IN AERS: 1.0000 | INHALATION_SPRAY | Freq: Four times a day (QID) | RESPIRATORY_TRACT | Status: DC | PRN

## 2014-05-14 MED ORDER — ALBUTEROL SULFATE (2.5 MG/3ML) 0.083% IN NEBU: 2.5000 mg | INHALATION_SOLUTION | Freq: Four times a day (QID) | RESPIRATORY_TRACT | Status: DC | PRN

## 2014-05-14 MED ORDER — PREDNISONE 20 MG PO TABS: ORAL_TABLET | ORAL | Status: DC

## 2014-05-14 NOTE — ED Notes (Signed)
Pt trying to call for a ride at this time.

## 2014-05-14 NOTE — ED Provider Notes (Signed)
CSN: [id number]     Arrival date & time 05/14/14  0553 History   First MD Initiated Contact with Patient 05/14/14 [phone]     Chief Complaint  Patient presents with  . Shortness of Breath     (Consider location/radiation/quality/duration/timing/severity/associated sxs/prior Treatment) HPI This patient is a 23 year old woman with a history of asthma. She is brought in by EMS from home after she developed shortness of breath and wheezing. She was treated with albuterol nebulized and 105 mg of IV Solu-Medrol on route. Her symptoms have resolved. She no longer has wheezing shortness of breath. She has had a minimally productive cough. No fever.  The patient has never been admitted to the hospital for asthma. She is a nonsmoker. She uses an albuterol inhaler as necessary. However, after several puffs, she found this to be ineffective this morning.  Past Medical History  Diagnosis Date  . Asthma   . 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 Trichomonas infection   . Bronchitis    Past Surgical History  Procedure Laterality Date  . No past surgeries     Family History  Problem Relation Age of Onset  . Anesthesia problems Neg Hx    History  Substance Use Topics  . Smoking status: Never Smoker   . Smokeless tobacco: Never Used  . Alcohol Use: No   OB History   Grav Para Term Preterm Abortions TAB SAB Ect Mult Living   3 3 3  0 0 0 0 0 0 2     Review of Systems  Ten point review of symptoms performed and is negative with the exception of symptoms noted above.   Allergies  Review of patient's allergies indicates no known allergies.  Home Medications   Prior to Admission medications   Medication Sig Start Date End Date Taking? Authorizing Provider  albuterol (PROVENTIL HFA;VENTOLIN HFA) 108 (90 BASE) MCG/ACT inhaler Inhale 2 puffs into the lungs every 6 (six) hours as needed for wheezing.   Yes Historical Provider, MD   BP 94/61  Pulse 90  Temp(Src) 98.5 F  (36.9 C) (Oral)  Resp 22  SpO2 97%  LMP 05/03/2014 Physical Exam Gen: well developed and well nourished appearing Head: NCAT Eyes: PERL, EOMI Nose: no epistaixis or rhinorrhea Mouth/throat: mucosa is moist and pink Neck: supple, no stridor Lungs: CTA B, no wheezing, rhonchi or rales CV: RRR, no murmur, extremities appear well perfused.  Abd: soft, notender, nondistended Back: no ttp, no cva ttp Skin: warm and dry Ext: normal to inspection, no dependent edema Neuro: CN ii-xii grossly intact, no focal deficits Psyche; normal affect,  calm and cooperative.   ED Course  Procedures (including critical care time) Labs Review   MDM   Impression and plan: Status post acute asthma exacerbation. Patient has normal vital signs at this time and her lungs are clear auscultation. Although she is uninsured, and she has a nebulizer machine at home which her son uses. We'll prescribe albuterol nebulized treatments 2 or prevent her albuterol HFA. Prednisone burst, outpatient followup. Patient is counseled regarding return precautions.    Brandt LoosenJulie Emrie Gayle, MD 05/14/14 [phone]

## 2014-05-14 NOTE — ED Notes (Signed)
Patient awoke with shortness of breath, used her own HFA, with no relief at home.  Patient was given 5mg /0.5mg  Albuterol/Atrovent.  Patient with wheezing in all fields.  Wheezing has cleared.  Patient given 125mg  Solumedrol IV en route by EMS.

## 2014-05-14 NOTE — Discharge Instructions (Signed)
Asthma, Adult  Asthma is a condition of the lungs in which the airways tighten and narrow. Asthma can make it hard to breathe. Asthma cannot be cured, but medicine and lifestyle changes can help control it. Asthma may be started (triggered) by:  · Animal skin flakes (dander).  · Dust.  · Cockroaches.  · Pollen.  · Mold.  · Smoke.  · Cleaning products.  · Hair sprays or aerosol sprays.  · Paint fumes or strong smells.  · Cold air, weather changes, and winds.  · Crying or laughing hard.  · Stress.  · Certain medicines or drugs.  · Foods, such as dried fruit, potato chips, and sparkling grape juice.  · Infections or conditions (colds, flu).  · Exercise.  · Certain medical conditions or diseases.  · Exercise or tiring activities.  HOME CARE   · Take medicine as told by your doctor.  · Use a peak flow meter as told by your doctor. A peak flow meter is a tool that measures how well the lungs are working.  · Record and keep track of the peak flow meter's readings.  · Understand and use the asthma action plan. An asthma action plan is a written plan for taking care of your asthma and treating your attacks.  · To help prevent asthma attacks:  ¨ Do not smoke. Stay away from secondhand smoke.  ¨ Change your heating and air conditioning filter often.  ¨ Limit your use of fireplaces and wood stoves.  ¨ Get rid of pests (such as roaches and mice) and their droppings.  ¨ Throw away plants if you see mold on them.  ¨ Clean your floors. Dust regularly. Use cleaning products that do not smell.  ¨ Have someone vacuum when you are not home. Use a vacuum cleaner with a HEPA filter if possible.  ¨ Replace carpet with wood, tile, or vinyl flooring. Carpet can trap animal skin flakes and dust.  ¨ Use allergy-proof pillows, mattress covers, and box spring covers.  ¨ Wash bed sheets and blankets every week in hot water and dry them in a dryer.  ¨ Use blankets that are made of polyester or cotton.  ¨ Clean bathrooms and kitchens with bleach.  If possible, have someone repaint the walls in these rooms with mold-resistant paint. Keep out of the rooms that are being cleaned and painted.  ¨ Wash hands often.  GET HELP IF:  · You have make a whistling sound when breaking (wheeze), have shortness of breath, or have a cough even if taking medicine to prevent attacks.  · The colored mucus you cough up (sputum) is thicker than usual.  · The colored mucus you cough up changes from clear or white to yellow, green, gray, or bloody.  · You have problems from the medicine you are taking such as:  ¨ A rash.  ¨ Itching.  ¨ Swelling.  ¨ Trouble breathing.  · You need reliever medicines more than 2-3 times a week.  · Your peak flow measurement is still at 50-79% of your personal best after following the action plan for 1 hour.  GET HELP RIGHT AWAY IF:   · You seem to be worse and are not responding to medicine during an asthma attack.  · You are short of breath even at rest.  · You get short of breath when doing very little activity.  · You have trouble eating, drinking, or talking.  · You have chest pain.  · You have   a fast heartbeat.  · Your lips or fingernails start to turn blue.  · You are lightheaded, dizzy, or faint.  · Your peak flow is less than 50% of your personal best.  · You have a fever or lasting symptoms for more than 2-3 days.  · You have a fever and your symptoms suddenly get worse.  MAKE SURE YOU:   · Understand these instructions.  · Will watch your condition.  · Will get help right away if you are not doing well or get worse.  Document Released: 04/10/2008 Document Revised: 08/13/2013 Document Reviewed: 05/22/2013  [employer]® Patient Information ©2015 [employer]. This information is not intended to replace advice given to you by your health care provider. Make sure you discuss any questions you have with your health care provider.

## 2014-07-28 ENCOUNTER — Emergency Department (HOSPITAL_COMMUNITY)
Admission: EM | Admit: 2014-07-28 | Discharge: 2014-07-28 | Disposition: A | Discharge status: Home / Self Care | Payer: Self-pay | Attending: Emergency Medicine | Admitting: Emergency Medicine

## 2014-07-28 ENCOUNTER — Encounter (HOSPITAL_COMMUNITY): Payer: Self-pay | Admitting: Emergency Medicine

## 2014-07-28 DIAGNOSIS — IMO0002 Reserved for concepts with insufficient information to code with codable children: Secondary | ICD-10-CM | POA: Insufficient documentation

## 2014-07-28 DIAGNOSIS — R0602 Shortness of breath: Secondary | ICD-10-CM | POA: Insufficient documentation

## 2014-07-28 DIAGNOSIS — J45901 Unspecified asthma with (acute) exacerbation: Secondary | ICD-10-CM | POA: Insufficient documentation

## 2014-07-28 DIAGNOSIS — Z8619 Personal history of other infectious and parasitic diseases: Secondary | ICD-10-CM | POA: Insufficient documentation

## 2014-07-28 DIAGNOSIS — J4520 Mild intermittent asthma, uncomplicated: Secondary | ICD-10-CM

## 2014-07-28 MED ORDER — AEROCHAMBER PLUS FLO-VU LARGE MISC
1.0000 | Freq: Once | Status: DC
Start: 2014-07-28 — End: 2014-07-28

## 2014-07-28 MED ORDER — ALBUTEROL SULFATE HFA 108 (90 BASE) MCG/ACT IN AERS
2.0000 | INHALATION_SPRAY | RESPIRATORY_TRACT | Status: DC | PRN
  Administered 2014-07-28: 2 via RESPIRATORY_TRACT
  Filled 2014-07-28: qty 6.7

## 2014-07-28 MED ORDER — ALBUTEROL SULFATE (2.5 MG/3ML) 0.083% IN NEBU: 2.5000 mg | INHALATION_SOLUTION | Freq: Four times a day (QID) | RESPIRATORY_TRACT | Status: DC | PRN

## 2014-07-28 MED ORDER — AEROCHAMBER Z-STAT PLUS MISC
1.0000 | Freq: Once | Status: DC
  Filled 2014-07-28: qty 1

## 2014-07-28 NOTE — ED Notes (Signed)
Brought in by EMS from home with c/o shortness of breath.  Pt reports that she has been having shortness of breath throughout the day yesterday--- took her asthma medications (inhaler) without relief.   Pt was given Albuterol 5 mg and Atrovent 0.5 mg neb tx en route to ED; was also given Solu-Medrol 125 mg IV by EMS. P t presents to ED A/Ox4, shortness of breath better and relieved, in no s/s apparent distress.

## 2014-07-28 NOTE — ED Provider Notes (Signed)
CSN: [id number]     Arrival date & time 07/28/14  0130 History   First MD Initiated Contact with Patient 07/28/14 0141     Chief Complaint  Patient presents with  . Shortness of Breath     (Consider location/radiation/quality/duration/timing/severity/associated sxs/prior Treatment) HPI Comments: Patient with HX asthma is out of her medications for several days and symptom shave worsened Does not have a PCP Patient transported to ED via EMS who administered albuterol neb and Solumedrol IV  Now patient asymptomatic    Patient is a 23 y.o. female presenting with shortness of breath. The history is provided by the patient.  Shortness of Breath Severity:  Unable to specify Onset quality:  Gradual Duration:  2 days Timing:  Intermittent Progression:  Waxing and waning Chronicity:  Recurrent Relieved by:  Nothing Worsened by:  Activity Ineffective treatments:  None tried Associated symptoms: cough and wheezing   Associated symptoms: no fever, no sore throat and no vomiting     Past Medical History  Diagnosis Date  . Asthma   . 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 Trichomonas infection   . Bronchitis    Past Surgical History  Procedure Laterality Date  . No past surgeries     Family History  Problem Relation Age of Onset  . Anesthesia problems Neg Hx    History  Substance Use Topics  . Smoking status: Never Smoker   . Smokeless tobacco: Never Used  . Alcohol Use: No   OB History   Grav Para Term Preterm Abortions TAB SAB Ect Mult Living   0 0 0 0 0 0 2     Review of Systems  Constitutional: Negative for fever.  HENT: Negative for rhinorrhea and sore throat.   Respiratory: Positive for cough, shortness of breath and wheezing.   Gastrointestinal: Negative for nausea and vomiting.  All other systems reviewed and are negative.     Allergies  Review of patient's allergies indicates no known allergies.  Home Medications   Prior to  Admission medications   Medication Sig Start Date End Date Taking? Authorizing Provider  PREDNISONE PO Take 1 tablet by mouth daily.   Yes Historical Provider, MD  albuterol (PROVENTIL) (2.5 MG/3ML) 0.083% nebulizer solution Take 3 mLs (2.5 mg total) by nebulization every 6 (six) hours as needed for wheezing or shortness of breath. 07/28/14   Arman Filter, NP   BP 126/71  Pulse 92  Temp(Src) 98.4 F (36.9 C) (Oral)  Resp 18  Ht  (1.499 m)  Wt 140 lb (63.504 kg)  BMI 28.26 kg/m2  SpO2 99% Physical Exam  Nursing note and vitals reviewed. Constitutional: She appears well-developed and well-nourished.  HENT:  Head: Normocephalic.  Eyes: Pupils are equal, round, and reactive to light.  Neck: Normal range of motion.  Cardiovascular: Normal rate.   Pulmonary/Chest: Effort normal. She has no wheezes.  Abdominal: Soft.  Musculoskeletal: Normal range of motion.  Neurological: She is alert.  Skin: Skin is warm.    ED Course  Procedures (including critical care time) Labs Review Labs Reviewed - No data to display  Imaging Review No results found.   EKG Interpretation None      MDM   Final diagnoses:  Asthma, mild intermittent, uncomplicated   Patient reexamined  No further wheezing episodes given inhaler and aerochamber plus Rx for albuterol solution for neb machine      Arman Filter, NP 07/28/14 0406  Arman Filter, NP 07/28/14 [phone]

## 2014-07-28 NOTE — ED Notes (Signed)
Bed: NG29 Expected date:  Expected time:  Means of arrival:  Comments: EMS 22yo asthma / albuterol / Solumedrol en route

## 2014-07-28 NOTE — ED Provider Notes (Signed)
Medical screening examination/treatment/procedure(s) were performed by non-physician practitioner and as supervising physician I was immediately available for consultation/collaboration.   EKG Interpretation None        Deaun Rocha, MD 07/28/14 2313 

## 2014-07-28 NOTE — Discharge Instructions (Signed)
Use the nebulizer on a regular basis every 6 hours while awake and use the inhaler while away form home or in emergencies  Please try to establish with a local PCP for regular care     Emergency Department Resource Guide 1) Find a Doctor and Pay Out of Pocket Although you won't have to find out who is covered by your insurance plan, it is a good idea to ask around and get recommendations. You will then need to call the office and see if the doctor you have chosen will accept you as a new patient and what types of options they offer for patients who are self-pay. Some doctors offer discounts or will set up payment plans for their patients who do not have insurance, but you will need to ask so you aren't surprised when you get to your appointment.  2) Contact Your Local Health Department Not all health departments have doctors that can see patients for sick visits, but many do, so it is worth a call to see if yours does. If you don't know where your local health department is, you can check in your phone book. The CDC also has a tool to help you locate your state's health department, and many state websites also have listings of all of their local health departments.  3) Find a Walk-in Clinic If your illness is not likely to be very severe or complicated, you may want to try a walk in clinic. These are popping up all over the country in pharmacies, drugstores, and shopping centers. They're usually staffed by nurse practitioners or physician assistants that have been trained to treat common illnesses and complaints. They're usually fairly quick and inexpensive. However, if you have serious medical issues or chronic medical problems, these are probably not your best option.  No Primary Care Doctor: - Call Health Connect at  [phone] - they can help you locate a primary care doctor that  accepts your insurance, provides certain services, etc. - Physician Referral Service- [phone]  Chronic Pain  Problems: Organization         Address  Phone   Notes  [facility]  [phone] Patients need to be referred by their primary care doctor.   Medication Assistance: Organization         Address  Phone   Notes  [facility] Medication [facility] [address] [place] [place], [place] [address] [phone] --Must be a resident of [facility] -- Must have NO insurance coverage whatsoever (no Medicaid/ Medicare, etc.) -- The pt. MUST have a primary care doctor that directs their care regularly and follows them in the community   [facility]  [phone]   [employer]  [phone]    Agencies that provide inexpensive medical care: Organization         Address  Phone   Notes  [facility]  [phone]   [facility]    [phone]   [facility] [address] [place], [place] [address] [phone]   [facility] [address] [place] [place] [phone]   Planned Parenthood    [phone]   [facility]    [phone]   [facility]  [address], [place] Phone:  [phone], Fax:  [phone] Hours of Operation:  9 am - 6 pm, M-F.  Also accepts Medicaid/Medicare and self-pay.  [facility]  [place] for [place], [address], [place] Phone: [phone], Fax: [phone]. Hours of Operation:  8:30 am - 5:30 pm, M-F.  Also accepts Medicaid and self-pay.  [facility] [address] [place] Phone: [phone]   [place], [place], [place] [phone], Ext. [phone] Mondays & Thursdays: 7-9 AM.  First 15 patients are seen on a first come, first serve basis.    [place] Providers:  Organization         Address  Phone   Notes  [facility] [address], [place] [phone] Also  accepts self-pay patients.  [facility] [address], [place]  [phone]   [place], [address], [place] [phone]   [facility] [address] [place] [phone]   Lucianne Lei [address], [place]   [phone] Only accepts [place] Access [place] patients after they have their name applied to their card.   Self-Pay (no insurance) in [facility]:  Organization         Address  Phone   Notes  Sickle Cell Patients, [facility] [place] [phone]   [facility] [place] [phone]   [facility] [place]  [place], [place], [place] [phone]   [facility]/Dr. Osei-Bonsu  [address] [place] or [address], [place] [phone] Phone number for both [place] and [place] locations is the same.  [facility] [address] [place] [phone]   [facility] [address], [place] or [address] [phone]   [facility] [address], [place] [phone], phone; [phone], fax Sees patients 1st and 3rd Saturday of every month.  Must not qualify for public or private insurance (i.e. Medicaid, Medicare, [place] Health Choice, Veterans' Benefits)  Household income should be no more than 200% of the poverty level The clinic cannot treat you if you are pregnant or think you are pregnant  Sexually transmitted diseases are not treated at the clinic.    Dental Care: Organization         Address  Phone  Notes  [facility] Department of [facility] [place] [phone] Accepts children up to age 72 who are enrolled in [place] or [place]; pregnant  women with a Medicaid card; and children who have applied for Medicaid or [place] Health Choice, but were declined, whose parents can pay a reduced fee at time of service.  [facility] Department of [facility]  [address], [place] [phone] Accepts children up to age 22 who are enrolled in [place] or [place]; pregnant women with a Medicaid card; and children who have applied for Medicaid or [place] Health Choice, but were declined, whose parents can pay a reduced fee at time of service.  [facility]  [place] [phone] Patients are seen by appointment only. Walk-ins are not accepted. Home will see patients 90 years of age and older. Monday - Tuesday (8am-5pm) Most Wednesdays (8:30-5pm) $30 per visit,  cash only  [employer] [facility] [employer] [facility]  [address], [place] [phone] Patients are seen by appointment only. Walk-ins are not accepted. [place] will see patients 1 years of age and older. One Wednesday Evening (Monthly: Volunteer Based).  $30 per visit, cash only  [place]  [phone] for adults; Children under age 73, call Graduate Pediatric Dentistry at [phone]. Children aged 60-14, please call [phone] to request a pediatric application.  Dental services are provided in all areas of dental care including fillings, crowns and bridges, complete and partial dentures, implants, gum treatment, root canals, and extractions. Preventive care is also provided. Treatment is provided to both adults and children. Patients are selected via a lottery and there is often a waiting list.   [facility] [address] [place]  [phone] [email or web address]   [facility] [address] [place], [place] [phone], Ext. [phone] Second and Fourth Thursday of each month, opens at 6:30 AM; Clinic ends at 9 AM.  Patients are  seen on a first-come first-served basis, and a limited number are seen during each clinic.   [facility]  [address] Hillard Danker [place], [place] [phone]   Eligibility Requirements You must have lived in [place], [place], or [place] counties for at least the last three months.   You cannot be eligible for state or federal sponsored [employer], including [employer], [place], or [employer].   You generally cannot be eligible for healthcare insurance through your employer.    How to apply: Eligibility screenings are held every Tuesday and Wednesday afternoon from 1:00 pm until 4:00 pm. You do not need an appointment for the interview!  [facility] [address], [place], [place]   [place]  [place] Department  [place]  [phone]    Behavioral Health Resources in the Community: Intensive Outpatient Programs Organization         Address  Phone  Notes  [place]. [address], [place], [place] [phone]   [facility] Outpatient [address] [place], [place]   [facility] [address] [place], [place]   [place] [address]  [place], [place] or [phone]   Substance Abuse Resources Organization         Address  Phone  Notes  Alcohol [facility] Drug Services  [phone]   [place]  [phone]   [facility] [place]   Chinita Pester  [phone]   Residential & Outpatient Substance Abuse Program  [phone]   Psychological Services Organization         Address  Phone  Notes  [facility] [place]  [place]  [phone]   [place] [address], [place] or [phone]    Mobile Crisis  Teams Organization         Address  Phone  Notes  [facility], Mobile Crisis Care Unit  [phone]   [facility]  [address] [place], [place]   [place] [address], [place] [phone]    Self-Help/Support Groups Organization         Address  Phone  Notes  [facility] [place] - variety of support groups  [phone]- [phone] Call for more information  Narcotics Anonymous (NA), [facility] [address], [employer] [place]  2 meetings at this location   Statistician         Address  Phone  Notes  [facility] [address],    [place]  [phone]   [facility]  [address] [place] [address], [place], [place] [phone]   [facility] [address] [place], [place] [phone] Admissions: 8am-3pm M-F  [facility] [address],    [place], [place] [phone]   [facility] [address] [place], [place], [place] [phone]   [facility] [address],  [place], [place] [phone]   [facility] - Intensive Outpatient [address], [place], [place] [phone]   [facility] ([facility] [address] [place].,  [place], [place] [phone] or [phone]   [facility] ([facility]) [address], [place], [place] [phone] Accepts Medicaid  [facility] [place] [address],  [place] [phone] Substance Abuse/Addiction Treatment   [facility] Organization         Address  Phone  Notes  [facility]  [phone]   Angie Fava, PhD [address] Ervin Knack [place], [place]   [phone] or [phone]   [facility] Behavioral   [address] [place], [place] [phone]   [facility] [address] [place], [place] [phone]  Insurance/Medicaid/sponsorship through [facility] and Families [address]                                    [place], [place] [phone] Therapy/tele-psych/case  [facility] [address], [place] [phone]    Dr. Lolly Mustache  [phone]   [facility]  [employer] [facility] Dept. 1) [address], [place] 2) [address], [place] 3)  [address], [place] [phone]  [phone]   [facility] Child Abuse Hotline [phone] or [phone] (After Hours)      Asthma Attack Prevention Although there is no way to prevent asthma from starting, you can take steps to control the disease and reduce its symptoms. Learn about your asthma and how to control it. Take an active role to control your asthma by working with your health care provider to create and follow an asthma action plan. An asthma action plan guides you in:  Taking your medicines properly.  Avoiding things that set off your asthma or make your asthma worse (asthma triggers).  Tracking your level of asthma control.  Responding to worsening asthma.  Seeking emergency care when needed. To track your asthma, keep records of your symptoms, check your peak flow number using a handheld device that shows how well air moves out of your lungs (peak flow meter), and get regular asthma checkups.  WHAT ARE SOME WAYS TO PREVENT AN ASTHMA ATTACK?  Take medicines as directed by your health care provider.  Keep track of your asthma symptoms and level of control.  With your health care provider, write a detailed plan for taking medicines and managing an asthma attack. Then be sure to follow your action  plan. Asthma is an ongoing condition that needs regular monitoring and treatment.  Identify and avoid asthma triggers. Many outdoor allergens and irritants (such as pollen, mold, cold air, and air pollution) can trigger asthma  attacks. Find out what your asthma triggers are and take steps to avoid them.  Monitor your breathing. Learn to recognize warning signs of an attack, such as coughing, wheezing, or shortness of breath. Your lung function may decrease before you notice any signs or symptoms, so regularly measure and record your peak airflow with a home peak flow meter.  Identify and treat attacks early. If you act quickly, you are less likely to have a severe attack. You will also need less medicine to control your symptoms. When your peak flow measurements decrease and alert you to an upcoming attack, take your medicine as instructed and immediately stop any activity that may have triggered the attack. If your symptoms do not improve, get medical help.  Pay attention to increasing quick-relief inhaler use. If you find yourself relying on your quick-relief inhaler, your asthma is not under control. See your health care provider about adjusting your treatment. WHAT CAN MAKE MY SYMPTOMS WORSE? A number of common things can set off or make your asthma symptoms worse and cause temporary increased inflammation of your airways. Keep track of your asthma symptoms for several weeks, detailing all the environmental and emotional factors that are linked with your asthma. When you have an asthma attack, go back to your asthma diary to see which factor, or combination of factors, might have contributed to it. Once you know what these factors are, you can take steps to control many of them. If you have allergies and asthma, it is important to take asthma prevention steps at home. Minimizing contact with the substance to which you are allergic will help prevent an asthma attack. Some triggers and ways to avoid these triggers are: Animal Dander:  Some people are allergic to the flakes of skin or dried saliva from animals with fur or feathers.   There is no such thing as a hypoallergenic dog or cat breed. All dogs or cats can cause  allergies, even if they don't shed.  Keep these pets out of your home.  If you are not able to keep a pet outdoors, keep the pet out of your bedroom and other sleeping areas at all times, and keep the door closed.  Remove carpets and furniture covered with cloth from your home. If that is not possible, keep the pet away from fabric-covered furniture and carpets. Dust Mites: Many people with asthma are allergic to dust mites. Dust mites are tiny bugs that are found in every home in mattresses, pillows, carpets, fabric-covered furniture, bedcovers, clothes, stuffed toys, and other fabric-covered items.   Cover your mattress in a special dust-proof cover.  Cover your pillow in a special dust-proof cover, or wash the pillow each week in hot water. Water must be hotter than 130 F (54.4 C) to kill dust mites. Cold or warm water used with detergent and bleach can also be effective.  Wash the sheets and blankets on your bed each week in hot water.  Try not to sleep or lie on cloth-covered cushions.  Call ahead when traveling and ask for a smoke-free hotel room. Bring your own bedding and pillows in case the hotel only supplies feather pillows and down comforters, which may contain dust mites and cause asthma symptoms.  Remove carpets from your bedroom and those laid on  concrete, if you can.  Keep stuffed toys out of the bed, or wash the toys weekly in hot water or cooler water with detergent and bleach. Cockroaches: Many people with asthma are allergic to the droppings and remains of cockroaches.   Keep food and garbage in closed containers. Never leave food out.  Use poison baits, traps, powders, gels, or paste (for example, boric acid).  If a spray is used to kill cockroaches, stay out of the room until the odor goes away. Indoor Mold:  Fix leaky faucets, pipes, or other sources of water that have mold around them.  Clean floors and moldy surfaces with a fungicide or diluted  bleach.  Avoid using humidifiers, vaporizers, or swamp coolers. These can spread molds through the air. Pollen and Outdoor Mold:  When pollen or mold spore counts are high, try to keep your windows closed.  Stay indoors with windows closed from late morning to afternoon. Pollen and some mold spore counts are highest at that time.  Ask your health care provider whether you need to take anti-inflammatory medicine or increase your dose of the medicine before your allergy season starts. Other Irritants to Avoid:  Tobacco smoke is an irritant. If you smoke, ask your health care provider how you can quit. Ask family members to quit smoking, too. Do not allow smoking in your home or car.  If possible, do not use a wood-burning stove, kerosene heater, or fireplace. Minimize exposure to all sources of smoke, including incense, candles, fires, and fireworks.  Try to stay away from strong odors and sprays, such as perfume, talcum powder, hair spray, and paints.  Decrease humidity in your home and use an indoor air cleaning device. Reduce indoor humidity to below 60%. Dehumidifiers or central air conditioners can do this.  Decrease house dust exposure by changing furnace and air cooler filters frequently.  Try to have someone else vacuum for you once or twice a week. Stay out of rooms while they are being vacuumed and for a short while afterward.  If you vacuum, use a dust mask from a hardware store, a double-layered or microfilter vacuum cleaner bag, or a vacuum cleaner with a HEPA filter.  Sulfites in foods and beverages can be irritants. Do not drink beer or wine or eat dried fruit, processed potatoes, or shrimp if they cause asthma symptoms.  Cold air can trigger an asthma attack. Cover your nose and mouth with a scarf on cold or windy days.  Several health conditions can make asthma more difficult to manage, including a runny nose, sinus infections, reflux disease, psychological stress, and  sleep apnea. Work with your health care provider to manage these conditions.  Avoid close contact with people who have a respiratory infection such as a cold or the flu, since your asthma symptoms may get worse if you catch the infection. Wash your hands thoroughly after touching items that may have been handled by people with a respiratory infection.  Get a flu shot every year to protect against the flu virus, which often makes asthma worse for days or weeks. Also get a pneumonia shot if you have not previously had one. Unlike the flu shot, the pneumonia shot does not need to be given yearly. Medicines:  Talk to your health care provider about whether it is safe for you to take aspirin or non-steroidal anti-inflammatory medicines (NSAIDs). In a small number of people with asthma, aspirin and NSAIDs can cause asthma attacks. These medicines must be avoided by  people who have known aspirin-sensitive asthma. It is important that people with aspirin-sensitive asthma read labels of all over-the-counter medicines used to treat pain, colds, coughs, and fever.  Beta-blockers and ACE inhibitors are other medicines you should discuss with your health care provider. HOW CAN I FIND OUT WHAT I AM ALLERGIC TO? Ask your asthma health care provider about allergy skin testing or blood testing (the RAST test) to identify the allergens to which you are sensitive. If you are found to have allergies, the most important thing to do is to try to avoid exposure to any allergens that you are sensitive to as much as possible. Other treatments for allergies, such as medicines and allergy shots (immunotherapy) are available.  CAN I EXERCISE? Follow your health care provider's advice regarding asthma treatment before exercising. It is important to maintain a regular exercise program, but vigorous exercise or exercise in cold, humid, or dry environments can cause asthma attacks, especially for those people who have exercise-induced  asthma. Document Released: 10/11/2009 Document Revised: 10/28/2013 Document Reviewed: 04/30/2013 [facility] Patient Information 2015 [place], [place]. This information is not intended to replace advice given to you by your health care provider. Make sure you discuss any questions you have with your health care provider.

## 2014-08-20 ENCOUNTER — Emergency Department (HOSPITAL_COMMUNITY): Payer: Self-pay

## 2014-08-20 ENCOUNTER — Emergency Department (HOSPITAL_COMMUNITY)
Admission: EM | Admit: 2014-08-20 | Discharge: 2014-08-20 | Disposition: A | Discharge status: Home / Self Care | Payer: Self-pay | Attending: Emergency Medicine | Admitting: Emergency Medicine

## 2014-08-20 ENCOUNTER — Encounter (HOSPITAL_COMMUNITY): Payer: Self-pay | Admitting: Emergency Medicine

## 2014-08-20 DIAGNOSIS — J45901 Unspecified asthma with (acute) exacerbation: Secondary | ICD-10-CM | POA: Insufficient documentation

## 2014-08-20 DIAGNOSIS — Z8619 Personal history of other infectious and parasitic diseases: Secondary | ICD-10-CM | POA: Insufficient documentation

## 2014-08-20 MED ORDER — IPRATROPIUM-ALBUTEROL 0.5-2.5 (3) MG/3ML IN SOLN
3.0000 mL | Freq: Once | RESPIRATORY_TRACT | Status: AC
Start: 2014-08-20 — End: 2014-08-20
  Administered 2014-08-20: 3 mL via RESPIRATORY_TRACT
  Filled 2014-08-20: qty 3

## 2014-08-20 MED ORDER — DEXAMETHASONE 6 MG PO TABS
10.0000 mg | ORAL_TABLET | Freq: Once | ORAL | Status: AC
  Administered 2014-08-20: 10 mg via ORAL
  Filled 2014-08-20: qty 1

## 2014-08-20 MED ORDER — ALBUTEROL SULFATE HFA 108 (90 BASE) MCG/ACT IN AERS: 1.0000 | INHALATION_SPRAY | Freq: Four times a day (QID) | RESPIRATORY_TRACT | Status: DC | PRN

## 2014-08-20 MED ORDER — ALBUTEROL (5 MG/ML) CONTINUOUS INHALATION SOLN
10.0000 mg/h | INHALATION_SOLUTION | RESPIRATORY_TRACT | Status: DC
  Administered 2014-08-20: 10 mg/h via RESPIRATORY_TRACT
  Filled 2014-08-20: qty 20

## 2014-08-20 MED ORDER — PREDNISONE 50 MG PO TABS: 50.0000 mg | ORAL_TABLET | Freq: Every day | ORAL | Status: DC

## 2014-08-20 NOTE — Discharge Instructions (Signed)
Asthma °Asthma is a recurring condition in which the airways tighten and narrow. Asthma can make it difficult to breathe. It can cause coughing, wheezing, and shortness of breath. Asthma episodes, also called asthma attacks, range from minor to life-threatening. Asthma cannot be cured, but medicines and lifestyle changes can help control it. °CAUSES °Asthma is believed to be caused by inherited (genetic) and environmental factors, but its exact cause is unknown. Asthma may be triggered by allergens, lung infections, or irritants in the air. Asthma triggers are different for each person. Common triggers include:  °· Animal dander. °· Dust mites. °· Cockroaches. °· Pollen from trees or grass. °· Mold. °· Smoke. °· Air pollutants such as dust, household cleaners, hair sprays, aerosol sprays, paint fumes, strong chemicals, or strong odors. °· Cold air, weather changes, and winds (which increase molds and pollens in the air). °· Strong emotional expressions such as crying or laughing hard. °· Stress. °· Certain medicines (such as aspirin) or types of drugs (such as beta-blockers). °· Sulfites in foods and drinks. Foods and drinks that may contain sulfites include dried fruit, potato chips, and sparkling grape juice. °· Infections or inflammatory conditions such as the flu, a cold, or an inflammation of the nasal membranes (rhinitis). °· Gastroesophageal reflux disease (GERD). °· Exercise or strenuous activity. °SYMPTOMS °Symptoms may occur immediately after asthma is triggered or many hours later. Symptoms include: °· Wheezing. °· Excessive nighttime or early morning coughing. °· Frequent or severe coughing with a common cold. °· Chest tightness. °· Shortness of breath. °DIAGNOSIS  °The diagnosis of asthma is made by a review of your medical history and a physical exam. Tests may also be performed. These may include: °· Lung function studies. These tests show how much air you breathe in and out. °· Allergy  tests. °· Imaging tests such as X-rays. °TREATMENT  °Asthma cannot be cured, but it can usually be controlled. Treatment involves identifying and avoiding your asthma triggers. It also involves medicines. There are 2 classes of medicine used for asthma treatment:  °· Controller medicines. These prevent asthma symptoms from occurring. They are usually taken every day. °· Reliever or rescue medicines. These quickly relieve asthma symptoms. They are used as needed and provide short-term relief. °Your health care provider will help you create an asthma action plan. An asthma action plan is a written plan for managing and treating your asthma attacks. It includes a list of your asthma triggers and how they may be avoided. It also includes information on when medicines should be taken and when their dosage should be changed. An action plan may also involve the use of a device called a peak flow meter. A peak flow meter measures how well the lungs are working. It helps you monitor your condition. °HOME CARE INSTRUCTIONS  °· Take medicines only as directed by your health care provider. Speak with your health care provider if you have questions about how or when to take the medicines. °· Use a peak flow meter as directed by your health care provider. Record and keep track of readings. °· Understand and use the action plan to help minimize or stop an asthma attack without needing to seek medical care. °· Control your home environment in the following ways to help prevent asthma attacks: °¨ Do not smoke. Avoid being exposed to secondhand smoke. °¨ Change your heating and air conditioning filter regularly. °¨ Limit your use of fireplaces and wood stoves. °¨ Get rid of pests (such as roaches and   mice) and their droppings. °¨ Throw away plants if you see mold on them. °¨ Clean your floors and dust regularly. Use unscented cleaning products. °¨ Try to have someone else vacuum for you regularly. Stay out of rooms while they are  being vacuumed and for a short while afterward. If you vacuum, use a dust mask from a hardware store, a double-layered or microfilter vacuum cleaner bag, or a vacuum cleaner with a HEPA filter. °¨ Replace carpet with wood, tile, or vinyl flooring. Carpet can trap dander and dust. °¨ Use allergy-proof pillows, mattress covers, and box spring covers. °¨ Wash bed sheets and blankets every week in hot water and dry them in a dryer. °¨ Use blankets that are made of polyester or cotton. °¨ Clean bathrooms and kitchens with bleach. If possible, have someone repaint the walls in these rooms with mold-resistant paint. Keep out of the rooms that are being cleaned and painted. °¨ Wash hands frequently. °SEEK MEDICAL CARE IF:  °· You have wheezing, shortness of breath, or a cough even if taking medicine to prevent attacks. °· The colored mucus you cough up (sputum) is thicker than usual. °· Your sputum changes from clear or white to yellow, green, gray, or bloody. °· You have any problems that may be related to the medicines you are taking (such as a rash, itching, swelling, or trouble breathing). °· You are using a reliever medicine more than 2-3 times per week. °· Your peak flow is still at 50-79% of your personal best after following your action plan for 1 hour. °· You have a fever. °SEEK IMMEDIATE MEDICAL CARE IF:  °· You seem to be getting worse and are unresponsive to treatment during an asthma attack. °· You are short of breath even at rest. °· You get short of breath when doing very little physical activity. °· You have difficulty eating, drinking, or talking due to asthma symptoms. °· You develop chest pain. °· You develop a fast heartbeat. °· You have a bluish color to your lips or fingernails. °· You are light-headed, dizzy, or faint. °· Your peak flow is less than 50% of your personal best. °MAKE SURE YOU:  °· Understand these instructions. °· Will watch your condition. °· Will get help right away if you are not  doing well or get worse. °Document Released: 10/23/2005 Document Revised: 03/09/2014 Document Reviewed: 05/22/2013 °ExitCare® Patient Information ©2015 [employer]. This information is not intended to replace advice given to you by your health care provider. Make sure you discuss any questions you have with your health care provider. ° ° ° °Emergency Department Resource Guide °1) Find a Doctor and Pay Out of Pocket °Although you won't have to find out who is covered by your insurance plan, it is a good idea to ask around and get recommendations. You will then need to call the office and see if the doctor you have chosen will accept you as a new patient and what types of options they offer for patients who are self-pay. Some doctors offer discounts or will set up payment plans for their patients who do not have insurance, but you will need to ask so you aren't surprised when you get to your appointment. ° °2) Contact Your Local Health Department °Not all health departments have doctors that can see patients for sick visits, but many do, so it is worth a call to see if yours does. If you don't know where your local health department is, you can check   in your phone book. The CDC also has a tool to help you locate your state's health department, and many state websites also have listings of all of their local health departments. ° °3) Find a Walk-in Clinic °If your illness is not likely to be very severe or complicated, you may want to try a walk in clinic. These are popping up all over the country in pharmacies, drugstores, and shopping centers. They're usually staffed by nurse practitioners or physician assistants that have been trained to treat common illnesses and complaints. They're usually fairly quick and inexpensive. However, if you have serious medical issues or chronic medical problems, these are probably not your best option. ° °No Primary Care Doctor: °- Call [facility] at  832-8000 - they can help you  locate a primary care doctor that  accepts your insurance, provides certain services, etc. °- Physician Referral Service- 1-800-533-3463 ° °Chronic Pain Problems: °Organization         Address  Phone   Notes  °[facility]  (336) 297-2271 Patients need to be referred by their primary care doctor.  ° °Medication Assistance: °Organization         Address  Phone   Notes  °[place] [facility] [address] °[place], [place] [address] (336) 641-8030 --Must be a resident of [place] °-- Must have NO insurance coverage whatsoever (no Medicaid/ Medicare, etc.) °-- The pt. MUST have a primary care doctor that directs their care regularly and follows them in the community °  °[facility]  (866) 331-1348   °[employer]  (888) 892-1162   ° °Agencies that provide inexpensive medical care: °Organization         Address  Phone   Notes  °[facility]  (336) 832-8035   °[facility]    (336) 832-7272   °[facility] [address] °[place], [place] [address] (336) 832-4777   °[facility] [address], °[place] (336) 271-4999   °[facility]    (336) 373-0678   °[facility]    (336) 272-1050   °[facility] ° [address], [place] Phone:  (336) 832-4444, Fax:  (336) 832-4440 Hours of Operation:  9 am - 6 pm, M-F.  Also accepts Medicaid/Medicare and self-pay.  °[facility] ° [address], [place] Phone: (336) 832-3150, Fax: (336) [phone]. Hours of Operation:  8:30 am - 5:30 pm, M-F.  Also accepts Medicaid and self-pay.  °[facility] [place] [address], [place] Phone: (336) 878-6027   °[facility] [address], [place],  (336)723-1848, Ext. 123 Mondays & Thursdays: 7-9 AM.  First 15 patients are seen on a first come, first serve basis. °  ° °Medicaid-accepting [place]  Providers: ° °Organization         Address  Phone   Notes  °[facility] [address], [place] (336) 641-2100 Also accepts self-pay patients.  °[facility] [address], [place] ° (336) 856-9996   °[facility] [address], [place] (336) 288-8857   °[facility] [address] [place] [address], [place] (336) 299-7000   °Veita Bland [address],   ° (336) 373-1557 Only accepts [place] Access Medicaid patients after they have their name applied to their   card.  ° °Self-Pay (no insurance) in [place]: ° °Organization         Address  Phone   Notes  °Sickle Cell Patients, [facility] [address], [place] (336) 832-1970   °[facility] [address], [place] (336) 832-4400   °[facility] ° [address], [place] (336) 992-4800   °[facility]/Dr. Osei-Bonsu ° [address] [place] [address], [place] or [address], [place] (336) 841-8500 Phone number for both [place] and [place] locations is the same.  °[facility] [address], [place] (336) 299-0000   °[facility] [address] [place] [address], [place] or [address] (336) 852-7530 °(336) 878-2260   °[facility] [address], [place] (336) 350-1642, phone; (336) 294-5005, fax Sees patients 1st and 3rd Saturday of every month.  Must not qualify for public or private insurance (i.e. Medicaid, Medicare, [place] Health Choice, Veterans' Benefits) • Household income should be no more than 200% of the poverty level •The clinic cannot treat you if you are pregnant or think you are pregnant • Sexually transmitted diseases are not treated at the clinic.  ° ° °Dental Care: °Organization         Address  Phone  Notes  °[facility] [address], [place] (336) 641-6152 Accepts children up to age 21 who are enrolled in Medicaid or [place] Health Choice; pregnant women with a Medicaid card; and children who have applied for Medicaid or [place] Health Choice, but were declined, whose parents can pay a reduced fee at time of service.  °[facility] [place]  [address], [place] (336) 641-7733 Accepts children up to age 21 who are enrolled in Medicaid or [place] Health Choice; pregnant women with a Medicaid card; and children who have applied for Medicaid or [place] Health Choice, but were declined, whose parents can pay a reduced fee at time of service.  °[facility] ° [address], [place] (336) 641-4533 Patients are seen by appointment only. Walk-ins are not accepted. [facility] will see patients 18 years of age and older. °Monday - Tuesday (8am-5pm) °Most Wednesdays (8:30-5pm) °$30 per visit, cash only  °[facility] ° [address], [place] (336) 641-4533 Patients are seen by appointment only. Walk-ins are not accepted. [facility] will see patients 18 years of age and older. °One Wednesday Evening (Monthly: Volunteer Based).  $30 per visit, cash only  °[facility]  (919) 537-3737 for adults; Children under age 4, call Graduate Pediatric Dentistry at ([phone]) [phone]. Children aged 4-14, please call (919) [phone] to request a pediatric application. ° Dental services are provided in all areas of dental care including fillings, crowns and bridges, complete and partial dentures, implants, gum treatment, root canals, and extractions. Preventive care is also provided. Treatment is provided to both adults and children. °Patients are selected via a lottery and there is often a waiting list. °  °[facility] [address], ° ° (336) 763-8833 [email or web address] °  °[facility]  [address], [place], [place] (336)723-1848, Ext. 123 Second and Fourth Thursday of each month, opens at 6:30 AM; Clinic ends at 9 AM.  Patients are seen on a first-come first-served basis, and   a limited number are seen during each clinic.  ° °[facility] ° [address], [place], [place] (336) 723-7904   Eligibility Requirements °You must have lived in [place], [place], or [place] counties for at least the last three months. °  You cannot be eligible for state or federal sponsored healthcare insurance, including Veterans Administration, Medicaid, or Medicare. °  You generally cannot be eligible for healthcare insurance through your employer.  °  How to apply: °Eligibility screenings are held every Tuesday and Wednesday afternoon from 1:00 pm until 4:00 pm. You do not need an appointment for the interview!  °[facility] [address], [place], [place] 336-631-2330   °[facility]  336-342-8273   °[facility]  336-703-3100   °[facility]  336-570-6415   ° °Behavioral Health Resources in the Community: °Intensive Outpatient Programs °Organization         Address  Phone  Notes  °[facility] [address], [place], [place] 336-878-6098   °[facility] [address], [place], [place] 336-832-9800   °[facility] [address], [place], [place] ° 336-882-2125   °[facility] [address],  °[place], [place] 1-800-853-5163 or 336-641-4981   °Substance Abuse Resources °Organization         Address  Phone  Notes  °[facility]  336-882-2125   °[facility]  336-784-9470   °[facility]  336-285-9073   °[facility]  336-845-3988   °Residential & Outpatient Substance Abuse Program  1-800-659-3381   °Psychological Services °Organization         Address  Phone  Notes  °[facility]  336- 832-9600     °[facility]  336- 378-7881   °[facility] [address], [place] 1-800-853-5163 or 336-641-4981   ° °Mobile Crisis Teams °Organization         Address  Phone  Notes  °[facility], Mobile Crisis Care Unit  1-877-626-1772   °Assertive °[facility] ° [address] [place], [place] 336-834-9664   °Sharon DeEsch [address] °[place] 336-554-5454   ° °Self-Help/Support Groups °Organization         Address  Phone             Notes  °[facility] - variety of support groups  336- 373-1402 Call for more information  °Narcotics Anonymous (NA), [facility] [address], °[place]  2 meetings at this location  ° °Residential Treatment Programs °Organization         Address  Phone  Notes  °[facility] [address],    °[place]  1-866-801-8205   °[facility] ° [address], [place], [place] 704-293-8524   °[facility] [address], [place] 336-845-3988 Admissions: 8am-3pm M-F  °[facility] [address]    °[place], [place] 336-841-1104   °[facility] [address], [place], [place] 336-379-7146   °[facility] [address]  °[place], [place] 336-285-9073   °[facility] Intensive Outpatient [address], [place], [place] 336-852-3033   °[facility] [address]  °[place], [place] 1-877-615-2722 or 336-784-9470   °[facility] [address] [place], [place] 336-227-7417 Accepts Medicaid  °[facility] [address]  °[place] 1-800-659-3381 Substance Abuse/Addiction Treatment  ° °[place] Behavioral Health Resources °Organization         Address  Phone  Notes  °[facility]  (888) 581-9988   °Julie Brannon, PhD [address] [place], [place]   (336) 349-5553 or (336) 951-0000    °[facility]   [address] °[place], [place] (336) 349-4454   °[facility] [address], [place], [place] (336) 342-8316 Insurance/Medicaid/sponsorship through [facility]  °[facility] [address]                                    [place], [place] (336) 342-8316 Therapy/tele-psych/case  °[facility] [address]  ° [place], [place] (336) 349-2233    °Dr. Arfeen  (336) 349-4544   °[facility] [place]  [employer] [facility] [place] [facility] 1) [address], [place] °2) [address], [place] °3)  [address], [place] (336) 349-3220 °(336) 342-7768 ° °(336) 342-8140   °[place] [facility] (336) 342-1394 or (336) 342-3537 (After Hours)    ° ° ° °

## 2014-08-20 NOTE — ED Notes (Signed)
Pt. On cardiac monitor. 

## 2014-08-20 NOTE — ED Notes (Signed)
Pt states that she has been having cough and congestion for 2 days; pt states that her symptoms got progressively worse tonight; pt states that she has used all her inhaler and that the nebulizer was not working; pt with increase work of breathing, wheezing and dry cough in triage

## 2014-08-20 NOTE — ED Provider Notes (Signed)
CSN: [id number]     Arrival date & time 08/20/14  0102 History   First MD Initiated Contact with Patient 08/20/14 0115     Chief Complaint  Patient presents with  . Asthma     (Consider location/radiation/quality/duration/timing/severity/associated sxs/prior Treatment) Patient is a 23 y.o. female presenting with asthma.  Asthma This is a new problem. The current episode started 2 days ago. The problem occurs constantly. The problem has not changed since onset.Associated symptoms include shortness of breath. Pertinent negatives include no chest pain, no abdominal pain and no headaches. Nothing aggravates the symptoms. Nothing relieves the symptoms. Treatments tried: albuterol.    Past Medical History  Diagnosis Date  . Asthma   . 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 Trichomonas infection   . Bronchitis    Past Surgical History  Procedure Laterality Date  . No past surgeries     Family History  Problem Relation Age of Onset  . Anesthesia problems Neg Hx    History  Substance Use Topics  . Smoking status: Never Smoker   . Smokeless tobacco: Never Used  . Alcohol Use: No   OB History   Grav Para Term Preterm Abortions TAB SAB Ect Mult Living   3 3 3  0 0 0 0 0 0 2     Review of Systems  Respiratory: Positive for shortness of breath.   Cardiovascular: Negative for chest pain.  Gastrointestinal: Negative for abdominal pain.  Neurological: Negative for headaches.  All other systems reviewed and are negative.     Allergies  Review of patient's allergies indicates no known allergies.  Home Medications   Prior to Admission medications   Medication Sig Start Date End Date Taking? Authorizing Provider  albuterol (PROVENTIL HFA;VENTOLIN HFA) 108 (90 BASE) MCG/ACT inhaler Inhale 2 puffs into the lungs every 6 (six) hours as needed for wheezing or shortness of breath.   Yes Historical Provider, MD  albuterol (PROVENTIL) (2.5 MG/3ML) 0.083%  nebulizer solution Take 3 mLs (2.5 mg total) by nebulization every 6 (six) hours as needed for wheezing or shortness of breath. 07/28/14  Yes Arman FilterGail K Schulz, NP  albuterol (PROVENTIL HFA;VENTOLIN HFA) 108 (90 BASE) MCG/ACT inhaler Inhale 1-2 puffs into the lungs every 6 (six) hours as needed for wheezing or shortness of breath. 08/20/14   Mirian MoMatthew Gentry, MD  predniSONE (DELTASONE) 50 MG tablet Take 1 tablet (50 mg total) by mouth daily. 08/20/14   Mirian MoMatthew Gentry, MD   BP 116/55  Pulse 118  Temp(Src) 98.1 F (36.7 C) (Oral)  Resp 20  SpO2 95%  LMP 07/22/2014 Physical Exam  Vitals reviewed. Constitutional: She is oriented to person, place, and time. She appears well-developed and well-nourished.  HENT:  Head: Normocephalic and atraumatic.  Right Ear: External ear normal.  Left Ear: External ear normal.  Eyes: Conjunctivae and EOM are normal. Pupils are equal, round, and reactive to light.  Neck: Normal range of motion. Neck supple.  Cardiovascular: Normal rate, regular rhythm, normal heart sounds and intact distal pulses.   Pulmonary/Chest: Effort normal. She has wheezes (diffusely).  Abdominal: Soft. Bowel sounds are normal. There is no tenderness.  Musculoskeletal: Normal range of motion.  Neurological: She is alert and oriented to person, place, and time.  Skin: Skin is warm and dry.    ED Course  Procedures (including critical care time) Labs Review Labs Reviewed - No data to display  Imaging Review Dg Chest 2 View  08/20/2014   CLINICAL  DATA:  Cough and congestion for 2 days. History of asthma and bronchitis. Initial encounter.  EXAM: CHEST  2 VIEW  COMPARISON:  04/14/2014; 06/15/2013  FINDINGS: Grossly unchanged cardiac silhouette and mediastinal contours. No focal parenchymal opacities. No pleural effusion or pneumothorax. No evidence of edema. No acute osseus abnormalities.  IMPRESSION: No acute cardiopulmonary disease.   Electronically Signed   By: Simonne ComeJohn  Watts M.D.   On:  08/20/2014 02:27     EKG Interpretation None      MDM   Final diagnoses:  Asthma attack    23 y.o. female with pertinent PMH of asthma presents with recurrent asthma attack symptoms.  On arrival vitals and physical exam as above.  CXR for productive cough x 2 days, unremarkable.  Given duoneb and 1 hour continuous neb with improvement.  Given decadron.  DC home in stable condition.    1. Asthma attack         Mirian MoMatthew Gentry, MD 08/20/14 2256

## 2014-09-07 ENCOUNTER — Encounter (HOSPITAL_COMMUNITY): Payer: Self-pay | Admitting: Emergency Medicine

## 2014-09-10 ENCOUNTER — Encounter (HOSPITAL_COMMUNITY): Payer: Self-pay | Admitting: Emergency Medicine

## 2014-09-10 ENCOUNTER — Emergency Department (HOSPITAL_COMMUNITY)
Admission: EM | Admit: 2014-09-10 | Discharge: 2014-09-10 | Disposition: A | Discharge status: Home / Self Care | Payer: Self-pay | Attending: Emergency Medicine | Admitting: Emergency Medicine

## 2014-09-10 DIAGNOSIS — J45909 Unspecified asthma, uncomplicated: Secondary | ICD-10-CM | POA: Insufficient documentation

## 2014-09-10 DIAGNOSIS — Z8619 Personal history of other infectious and parasitic diseases: Secondary | ICD-10-CM | POA: Insufficient documentation

## 2014-09-10 DIAGNOSIS — H9209 Otalgia, unspecified ear: Secondary | ICD-10-CM | POA: Insufficient documentation

## 2014-09-10 DIAGNOSIS — J020 Streptococcal pharyngitis: Secondary | ICD-10-CM

## 2014-09-10 DIAGNOSIS — Z79899 Other long term (current) drug therapy: Secondary | ICD-10-CM | POA: Insufficient documentation

## 2014-09-10 DIAGNOSIS — Z7952 Long term (current) use of systemic steroids: Secondary | ICD-10-CM | POA: Insufficient documentation

## 2014-09-10 DIAGNOSIS — J029 Acute pharyngitis, unspecified: Secondary | ICD-10-CM | POA: Insufficient documentation

## 2014-09-10 LAB — RAPID STREP SCREEN (MED CTR MEBANE ONLY): Streptococcus, Group A Screen (Direct): POSITIVE — AB

## 2014-09-10 MED ORDER — PENICILLIN G BENZATHINE 1200000 UNIT/2ML IM SUSP
1.2000 10*6.[IU] | Freq: Once | INTRAMUSCULAR | Status: AC
  Administered 2014-09-10: 1.2 10*6.[IU] via INTRAMUSCULAR
  Filled 2014-09-10: qty 2

## 2014-09-10 NOTE — ED Provider Notes (Signed)
CSN: [id number]     Arrival date & time 09/10/14  [id number] History  This chart was scribed for non-physician practitioner, Santiago GladHeather Annabeth Tortora, PA-C, working with Toy CookeyMegan Docherty, MD, by Bronson CurbJacqueline Melvin, ED Scribe. This patient was seen in room TR06C/TR06C and the patient's care was started at 10:39 AM.   Chief Complaint  Patient presents with  . Sore Throat  . Otalgia    The history is provided by the patient. No language interpreter was used.     HPI Comments: Amanda Murillo is a 23 y.o. female who presents to the Emergency Department complaining of worsening constant sore throat that started yesterday. There is associated "slight" cough and pain with swallowing.  However, she does not have any difficulty swallowing.  Patient also notes right ear "soreness".  Patient has not taken anything for symptom relief. She denies fever, congestion, rhinorrhea, or sick contacts.   Past Medical History  Diagnosis Date  . Asthma   . 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 Trichomonas infection   . Bronchitis    Past Surgical History  Procedure Laterality Date  . No past surgeries     Family History  Problem Relation Age of Onset  . Anesthesia problems Neg Hx    History  Substance Use Topics  . Smoking status: Never Smoker   . Smokeless tobacco: Never Used  . Alcohol Use: No   OB History    Gravida Para Term Preterm AB TAB SAB Ectopic Multiple Living   3 3 3  0 0 0 0 0 0 2     Review of Systems  Constitutional: Negative for fever.  HENT: Positive for ear pain (right ear) and sore throat. Negative for congestion and rhinorrhea.   Respiratory: Positive for cough.       Allergies  Review of patient's allergies indicates no known allergies.  Home Medications   Prior to Admission medications   Medication Sig Start Date End Date Taking? Authorizing Provider  albuterol (PROVENTIL HFA;VENTOLIN HFA) 108 (90 BASE) MCG/ACT inhaler Inhale 2 puffs into the lungs every 6  (six) hours as needed for wheezing or shortness of breath.    Historical Provider, MD  albuterol (PROVENTIL HFA;VENTOLIN HFA) 108 (90 BASE) MCG/ACT inhaler Inhale 1-2 puffs into the lungs every 6 (six) hours as needed for wheezing or shortness of breath. 08/20/14   Mirian MoMatthew Gentry, MD  albuterol (PROVENTIL) (2.5 MG/3ML) 0.083% nebulizer solution Take 3 mLs (2.5 mg total) by nebulization every 6 (six) hours as needed for wheezing or shortness of breath. 07/28/14   Arman FilterGail K Schulz, NP  predniSONE (DELTASONE) 50 MG tablet Take 1 tablet (50 mg total) by mouth daily. 08/20/14   Mirian MoMatthew Gentry, MD   Triage Vitals: BP 101/70 mmHg  Pulse 78  Temp(Src) 99.1 F (37.3 C) (Oral)  Resp 16  SpO2 100%  LMP 08/29/2014  Physical Exam  Constitutional: She is oriented to person, place, and time. She appears well-developed and well-nourished. No distress.  HENT:  Head: Normocephalic and atraumatic.  Right Ear: Tympanic membrane normal.  Left Ear: Tympanic membrane normal.  Mouth/Throat: No trismus in the jaw. Oropharyngeal exudate and posterior oropharyngeal erythema present.  Bilateral tonsillar enlargement. Very mild erythema of the oropharynx. Exudate present. Uvula midline. Normal voice phonation. Handling secretions well. No trismus.   Eyes: Conjunctivae and EOM are normal.  Neck: No tracheal deviation present.  Cardiovascular: Normal rate, regular rhythm and normal heart sounds.   Pulmonary/Chest: Effort normal and breath sounds  normal. No respiratory distress.  Musculoskeletal: Normal range of motion.  Lymphadenopathy:    She has cervical adenopathy.  Anterior cervical adenopathy.  Neurological: She is alert and oriented to person, place, and time.  Skin: Skin is warm and dry.  Psychiatric: She has a normal mood and affect. Her behavior is normal.  Nursing note and vitals reviewed.   ED Course  Procedures (including critical care time)  DIAGNOSTIC STUDIES: Oxygen Saturation is 100% on room air,  normal by my interpretation.    COORDINATION OF CARE: At 1042 Discussed treatment plan with patient which includes Penicillin injection. Patient agrees.   Labs Review Labs Reviewed  RAPID STREP SCREEN - Abnormal; Notable for the following:    Streptococcus, Group A Screen (Direct) POSITIVE (*)    All other components within normal limits    Imaging Review No results found.   EKG Interpretation None      MDM   Final diagnoses:  None   Patient presents today with a sore throat that has been present since yesterday.  No signs of Peritonsillar Abscess or Retropharngeal abscess.  Patient non toxic appearing.  Tolerating PO liquids.  Patient given IM PCN in the ED.  Patient stable for discharge.  Return precautions given.   Santiago GladHeather Nyeema Want, PA-C 09/10/14 1247  Toy CookeyMegan Docherty, MD 09/10/14 2229

## 2014-09-10 NOTE — ED Notes (Signed)
Pt started yesterday with sore throat, chills and body aches. Today, woke with right ear pain.

## 2014-09-10 NOTE — Discharge Instructions (Signed)
Return to the Emergency Department  Immediately if you are unable to open your mouth, swallow, or have difficulty breathing.

## 2015-02-02 ENCOUNTER — Encounter (HOSPITAL_COMMUNITY): Payer: Self-pay

## 2015-02-02 ENCOUNTER — Emergency Department (HOSPITAL_COMMUNITY): Payer: Self-pay

## 2015-02-02 ENCOUNTER — Inpatient Hospital Stay (HOSPITAL_COMMUNITY)
Admission: EM | Admit: 2015-02-02 | Discharge: 2015-02-04 | DRG: 871 | Disposition: A | Discharge status: Home / Self Care | Payer: Self-pay | Attending: Internal Medicine | Admitting: Internal Medicine

## 2015-02-02 DIAGNOSIS — J9601 Acute respiratory failure with hypoxia: Secondary | ICD-10-CM | POA: Diagnosis present

## 2015-02-02 DIAGNOSIS — I248 Other forms of acute ischemic heart disease: Secondary | ICD-10-CM | POA: Diagnosis present

## 2015-02-02 DIAGNOSIS — J189 Pneumonia, unspecified organism: Secondary | ICD-10-CM

## 2015-02-02 DIAGNOSIS — D649 Anemia, unspecified: Secondary | ICD-10-CM | POA: Diagnosis present

## 2015-02-02 DIAGNOSIS — R6520 Severe sepsis without septic shock: Secondary | ICD-10-CM | POA: Diagnosis present

## 2015-02-02 DIAGNOSIS — I4581 Long QT syndrome: Secondary | ICD-10-CM | POA: Diagnosis present

## 2015-02-02 DIAGNOSIS — R9431 Abnormal electrocardiogram [ECG] [EKG]: Secondary | ICD-10-CM | POA: Diagnosis present

## 2015-02-02 DIAGNOSIS — J45901 Unspecified asthma with (acute) exacerbation: Secondary | ICD-10-CM | POA: Diagnosis present

## 2015-02-02 DIAGNOSIS — R0602 Shortness of breath: Secondary | ICD-10-CM

## 2015-02-02 DIAGNOSIS — J45902 Unspecified asthma with status asthmaticus: Secondary | ICD-10-CM

## 2015-02-02 DIAGNOSIS — E876 Hypokalemia: Secondary | ICD-10-CM | POA: Diagnosis present

## 2015-02-02 DIAGNOSIS — Z823 Family history of stroke: Secondary | ICD-10-CM

## 2015-02-02 DIAGNOSIS — Z79899 Other long term (current) drug therapy: Secondary | ICD-10-CM

## 2015-02-02 DIAGNOSIS — A419 Sepsis, unspecified organism: Principal | ICD-10-CM | POA: Diagnosis present

## 2015-02-02 DIAGNOSIS — J180 Bronchopneumonia, unspecified organism: Secondary | ICD-10-CM | POA: Diagnosis present

## 2015-02-02 LAB — BLOOD GAS, ARTERIAL
Acid-base deficit: 13.5 mmol/L — ABNORMAL HIGH (ref 0.0–2.0)
Bicarbonate: 11.8 mEq/L — ABNORMAL LOW (ref 20.0–24.0)
Drawn by: 295031
O2 CONTENT: 2 L/min
O2 SAT: 94.8 %
PATIENT TEMPERATURE: 98.6
PCO2 ART: 26.2 mmHg — AB (ref 35.0–45.0)
TCO2: 11.1 mmol/L (ref 0–100)
pH, Arterial: 7.275 — ABNORMAL LOW (ref 7.350–7.450)
pO2, Arterial: 90.4 mmHg (ref 80.0–100.0)

## 2015-02-02 LAB — COMPREHENSIVE METABOLIC PANEL
ALT: 10 U/L (ref 0–35)
AST: 27 U/L (ref 0–37)
Albumin: 4.2 g/dL (ref 3.5–5.2)
Alkaline Phosphatase: 37 U/L — ABNORMAL LOW (ref 39–117)
Anion gap: 10 (ref 5–15)
BUN: 10 mg/dL (ref 6–23)
CO2: 17 mmol/L — ABNORMAL LOW (ref 19–32)
Calcium: 8.1 mg/dL — ABNORMAL LOW (ref 8.4–10.5)
Chloride: 112 mmol/L (ref 96–112)
Creatinine, Ser: 0.75 mg/dL (ref 0.50–1.10)
GLUCOSE: 149 mg/dL — AB (ref 70–99)
Potassium: 3.1 mmol/L — ABNORMAL LOW (ref 3.5–5.1)
Sodium: 139 mmol/L (ref 135–145)
TOTAL PROTEIN: 7.6 g/dL (ref 6.0–8.3)
Total Bilirubin: 0.6 mg/dL (ref 0.3–1.2)

## 2015-02-02 LAB — CBC WITH DIFFERENTIAL/PLATELET
BASOS ABS: 0 10*3/uL (ref 0.0–0.1)
Basophils Relative: 0 % (ref 0–1)
EOS ABS: 0 10*3/uL (ref 0.0–0.7)
Eosinophils Relative: 0 % (ref 0–5)
HEMATOCRIT: 34.6 % — AB (ref 36.0–46.0)
Hemoglobin: 11.4 g/dL — ABNORMAL LOW (ref 12.0–15.0)
LYMPHS ABS: 0.4 10*3/uL — AB (ref 0.7–4.0)
Lymphocytes Relative: 4 % — ABNORMAL LOW (ref 12–46)
MCH: 26.4 pg (ref 26.0–34.0)
MCHC: 32.9 g/dL (ref 30.0–36.0)
MCV: 80.1 fL (ref 78.0–100.0)
Monocytes Absolute: 0.1 10*3/uL (ref 0.1–1.0)
Monocytes Relative: 1 % — ABNORMAL LOW (ref 3–12)
NEUTROS ABS: 12 10*3/uL — AB (ref 1.7–7.7)
NEUTROS PCT: 95 % — AB (ref 43–77)
Platelets: 199 10*3/uL (ref 150–400)
RBC: 4.32 MIL/uL (ref 3.87–5.11)
RDW: 12.9 % (ref 11.5–15.5)
WBC: 12.6 10*3/uL — AB (ref 4.0–10.5)

## 2015-02-02 LAB — LACTIC ACID, PLASMA
Lactic Acid, Venous: 4.1 mmol/L (ref 0.5–2.0)
Lactic Acid, Venous: 3.2 mmol/L (ref 0.5–2.0)

## 2015-02-02 LAB — D-DIMER, QUANTITATIVE (NOT AT ARMC): D DIMER QUANT: 0.73 ug{FEU}/mL — AB (ref 0.00–0.48)

## 2015-02-02 LAB — URINALYSIS, ROUTINE W REFLEX MICROSCOPIC
BILIRUBIN URINE: NEGATIVE
Glucose, UA: 250 mg/dL — AB
HGB URINE DIPSTICK: NEGATIVE
KETONES UR: NEGATIVE mg/dL
Leukocytes, UA: NEGATIVE
Nitrite: NEGATIVE
Protein, ur: NEGATIVE mg/dL
Specific Gravity, Urine: 1.018 (ref 1.005–1.030)
Urobilinogen, UA: 0.2 mg/dL (ref 0.0–1.0)
pH: 5.5 (ref 5.0–8.0)

## 2015-02-02 LAB — PREGNANCY, URINE: Preg Test, Ur: NEGATIVE

## 2015-02-02 LAB — I-STAT CG4 LACTIC ACID, ED: Lactic Acid, Venous: 7.37 mmol/L (ref 0.5–2.0)

## 2015-02-02 LAB — INFLUENZA PANEL BY PCR (TYPE A & B)
H1N1FLUPCR: NOT DETECTED
INFLAPCR: NEGATIVE
INFLBPCR: NEGATIVE

## 2015-02-02 LAB — TROPONIN I: Troponin I: 0.03 ng/mL (ref <0.031)

## 2015-02-02 LAB — MAGNESIUM: Magnesium: 2 mg/dL (ref 1.5–2.5)

## 2015-02-02 LAB — STREP PNEUMONIAE URINARY ANTIGEN: STREP PNEUMO URINARY ANTIGEN: NEGATIVE

## 2015-02-02 LAB — MRSA PCR SCREENING: MRSA by PCR: NEGATIVE

## 2015-02-02 MED ORDER — ACETAMINOPHEN 325 MG PO TABS
650.0000 mg | ORAL_TABLET | Freq: Four times a day (QID) | ORAL | Status: DC | PRN
  Administered 2015-02-04: 650 mg via ORAL
  Filled 2015-02-02: qty 2

## 2015-02-02 MED ORDER — SODIUM CHLORIDE 0.9 % IV BOLUS (SEPSIS)
2000.0000 mL | Freq: Once | INTRAVENOUS | Status: AC
  Administered 2015-02-02: 2000 mL via INTRAVENOUS

## 2015-02-02 MED ORDER — POTASSIUM CHLORIDE IN NACL 40-0.9 MEQ/L-% IV SOLN
INTRAVENOUS | Status: AC
  Administered 2015-02-02 – 2015-02-03 (×3): 125 mL/h via INTRAVENOUS
  Filled 2015-02-02 (×3): qty 1000

## 2015-02-02 MED ORDER — DEXTROSE 5 % IV SOLN
1.0000 g | Freq: Once | INTRAVENOUS | Status: AC
  Administered 2015-02-02: 1 g via INTRAVENOUS
  Filled 2015-02-02: qty 10

## 2015-02-02 MED ORDER — IPRATROPIUM-ALBUTEROL 0.5-2.5 (3) MG/3ML IN SOLN
3.0000 mL | RESPIRATORY_TRACT | Status: DC
  Administered 2015-02-02 (×3): 3 mL via RESPIRATORY_TRACT
  Filled 2015-02-02 (×3): qty 3

## 2015-02-02 MED ORDER — SODIUM CHLORIDE 0.9 % IV BOLUS (SEPSIS)
1000.0000 mL | Freq: Once | INTRAVENOUS | Status: AC
  Administered 2015-02-02: 1000 mL via INTRAVENOUS

## 2015-02-02 MED ORDER — ENOXAPARIN SODIUM 40 MG/0.4ML ~~LOC~~ SOLN
40.0000 mg | SUBCUTANEOUS | Status: DC
  Administered 2015-02-02 – 2015-02-03 (×2): 40 mg via SUBCUTANEOUS
  Filled 2015-02-02 (×2): qty 0.4

## 2015-02-02 MED ORDER — IOHEXOL 350 MG/ML SOLN
100.0000 mL | Freq: Once | INTRAVENOUS | Status: AC | PRN
  Administered 2015-02-02: 100 mL via INTRAVENOUS

## 2015-02-02 MED ORDER — ONDANSETRON HCL 4 MG PO TABS: 4.0000 mg | ORAL_TABLET | Freq: Two times a day (BID) | ORAL | Status: DC | PRN

## 2015-02-02 MED ORDER — SODIUM CHLORIDE 0.9 % IV BOLUS (SEPSIS)
1000.0000 mL | INTRAVENOUS | Status: AC
  Administered 2015-02-02: 1000 mL via INTRAVENOUS

## 2015-02-02 MED ORDER — ONDANSETRON HCL 4 MG/2ML IJ SOLN: 4.0000 mg | Freq: Four times a day (QID) | INTRAMUSCULAR | Status: DC | PRN

## 2015-02-02 MED ORDER — SODIUM CHLORIDE 0.9 % IJ SOLN
3.0000 mL | Freq: Two times a day (BID) | INTRAMUSCULAR | Status: DC
  Administered 2015-02-02 – 2015-02-04 (×4): 3 mL via INTRAVENOUS

## 2015-02-02 MED ORDER — POTASSIUM CHLORIDE CRYS ER 20 MEQ PO TBCR
40.0000 meq | EXTENDED_RELEASE_TABLET | Freq: Once | ORAL | Status: AC
  Administered 2015-02-02: 40 meq via ORAL
  Filled 2015-02-02: qty 2

## 2015-02-02 MED ORDER — IPRATROPIUM-ALBUTEROL 0.5-2.5 (3) MG/3ML IN SOLN
3.0000 mL | Freq: Once | RESPIRATORY_TRACT | Status: AC
  Administered 2015-02-02: 3 mL via RESPIRATORY_TRACT
  Filled 2015-02-02: qty 3

## 2015-02-02 MED ORDER — ONDANSETRON HCL 4 MG/2ML IJ SOLN: 4.0000 mg | Freq: Two times a day (BID) | INTRAMUSCULAR | Status: DC | PRN

## 2015-02-02 MED ORDER — FAMOTIDINE 20 MG PO TABS
20.0000 mg | ORAL_TABLET | Freq: Two times a day (BID) | ORAL | Status: DC
  Administered 2015-02-02 – 2015-02-04 (×4): 20 mg via ORAL
  Filled 2015-02-02 (×4): qty 1

## 2015-02-02 MED ORDER — ALBUTEROL (5 MG/ML) CONTINUOUS INHALATION SOLN
10.0000 mg/h | INHALATION_SOLUTION | RESPIRATORY_TRACT | Status: DC
  Administered 2015-02-02: 10 mg/h via RESPIRATORY_TRACT
  Filled 2015-02-02 (×2): qty 20

## 2015-02-02 MED ORDER — DEXTROSE 5 % IV SOLN
500.0000 mg | Freq: Once | INTRAVENOUS | Status: AC
  Administered 2015-02-02: 500 mg via INTRAVENOUS
  Filled 2015-02-02: qty 500

## 2015-02-02 MED ORDER — GUAIFENESIN ER 600 MG PO TB12
1200.0000 mg | ORAL_TABLET | Freq: Two times a day (BID) | ORAL | Status: DC
  Administered 2015-02-02 – 2015-02-04 (×4): 1200 mg via ORAL
  Filled 2015-02-02 (×5): qty 2

## 2015-02-02 MED ORDER — DEXTROSE 5 % IV SOLN: 500.0000 mg | INTRAVENOUS | Status: DC

## 2015-02-02 MED ORDER — METHYLPREDNISOLONE SODIUM SUCC 125 MG IJ SOLR
60.0000 mg | Freq: Four times a day (QID) | INTRAMUSCULAR | Status: DC
  Administered 2015-02-02 – 2015-02-03 (×4): 60 mg via INTRAVENOUS
  Filled 2015-02-02 (×4): qty 2

## 2015-02-02 MED ORDER — LORAZEPAM 2 MG/ML IJ SOLN
1.0000 mg | Freq: Once | INTRAMUSCULAR | Status: AC
  Administered 2015-02-02: 1 mg via INTRAVENOUS
  Filled 2015-02-02: qty 1

## 2015-02-02 MED ORDER — DEXTROSE 5 % IV SOLN
1.0000 g | INTRAVENOUS | Status: DC
  Administered 2015-02-03 – 2015-02-04 (×2): 1 g via INTRAVENOUS
  Filled 2015-02-02 (×3): qty 10

## 2015-02-02 MED ORDER — DOXYCYCLINE HYCLATE 100 MG PO TABS
100.0000 mg | ORAL_TABLET | Freq: Two times a day (BID) | ORAL | Status: DC
Start: 2015-02-03 — End: 2015-02-04
  Administered 2015-02-03 – 2015-02-04 (×3): 100 mg via ORAL
  Filled 2015-02-02 (×4): qty 1

## 2015-02-02 MED ORDER — GUAIFENESIN-DM 100-10 MG/5ML PO SYRP
5.0000 mL | ORAL_SOLUTION | ORAL | Status: DC | PRN
  Administered 2015-02-02: 5 mL via ORAL
  Filled 2015-02-02: qty 10

## 2015-02-02 MED ORDER — ACETAMINOPHEN 650 MG RE SUPP: 650.0000 mg | Freq: Four times a day (QID) | RECTAL | Status: DC | PRN

## 2015-02-02 MED ORDER — ALBUTEROL SULFATE (2.5 MG/3ML) 0.083% IN NEBU: 2.5000 mg | INHALATION_SOLUTION | RESPIRATORY_TRACT | Status: DC | PRN

## 2015-02-02 MED ORDER — ALUM & MAG HYDROXIDE-SIMETH 200-200-20 MG/5ML PO SUSP: 30.0000 mL | Freq: Four times a day (QID) | ORAL | Status: DC | PRN

## 2015-02-02 MED ORDER — ONDANSETRON HCL 4 MG PO TABS: 4.0000 mg | ORAL_TABLET | Freq: Four times a day (QID) | ORAL | Status: DC | PRN

## 2015-02-02 NOTE — Progress Notes (Signed)
CRITICAL VALUE ALERT  Critical value received:  Lactic Acid 3.2  Date of notification:  02/02/15  Time of notification:  2104  Critical value read back:  yes  Nurse who received alert:  Lucillie Garfinkel Nyema Hachey  MD notified (1st page):  Donnamarie PoagK Kirby  Time of first page:  2107  MD notified (2nd page):  Time of second page:  Responding MD:    Time MD responded:

## 2015-02-02 NOTE — ED Provider Notes (Signed)
Patient seen/examined in the Emergency Department in conjunction with Midlevel Provider  Patient reports shortness of breath Exam : awake/alert, tachypneic and tachycardic.  Wheezing is improved Plan: will need r/o PE due to episodes of hypotension and pleuritic CP    Zadie Rhineonald Gailen Venne, MD 02/02/15 [phone]

## 2015-02-02 NOTE — ED Notes (Signed)
Patient transported to X-ray 

## 2015-02-02 NOTE — ED Notes (Signed)
Patient transported to CT 

## 2015-02-02 NOTE — Progress Notes (Addendum)
CRITICAL VALUE ALERT  Critical value received:  Lactic Acid 4.1  Date of notification:  02/02/15  Time of notification:  1630  Critical value read back: yes  Nurse who received alert:  Harrietta GuardianJ. Jakhari Space RN  MD notified (1st page):   Time of first page:   MD notified (2nd page):  Time of second page:  Responding MD:  Time MD responded:

## 2015-02-02 NOTE — ED Notes (Signed)
Patient arrives to ED via EMS.  She reports being short of breath for several hours PTA.  EMS reports her lung sounds were diminished throughout.  EMS administered DuoNeb x 2, Magnesium 2 grams, SoluMedrol 125mg .  Patient continues to be short of breath, but is able to talk and answer simple questions.

## 2015-02-02 NOTE — ED Provider Notes (Addendum)
Lactate >7 Pt awake/alert, SBP>100 She has been given 1LNS Will received another 2LNS D/w dr Waymon Amatohongalgi, he is aware of lactate Suspect this may be partially due to her severe asthma   Zadie Rhineonald Dallie Patton, MD 02/02/15 1032  Zadie Rhineonald Alvin Diffee, MD 02/02/15 [phone]

## 2015-02-02 NOTE — ED Notes (Signed)
I tried to get blood twice earlier this morning and was unsuccessful

## 2015-02-02 NOTE — ED Notes (Signed)
Bed: WA17 Expected date:  Expected time:  Means of arrival:  Comments: Ems  resp difficulty

## 2015-02-02 NOTE — ED Provider Notes (Signed)
Pt found to have pneumonia after CT chest Given vitals, code sepsis called, and pt will be given IV fluids   Amanda Murillo Frampton, MD 02/02/15 1017

## 2015-02-02 NOTE — ED Provider Notes (Signed)
CSN: [id number]     Arrival date & time 02/02/15  0325 History   First MD Initiated Contact with Patient 02/02/15 [phone]     Chief Complaint  Patient presents with  . Shortness of Breath     (Consider location/radiation/quality/duration/timing/severity/associated sxs/prior Treatment) HPI  Amanda Murillo is a 24 y.o. female complaining of shortness of breath and pleuritic chest pain onset at 4 AM which woke her from sleep. EMS came and gave to do a nebs, 125 mg of Solu-Medrol, 2 g of magnesium with fleeting relief. She states that she's had rhinorrhea, minimally relieved with Benadryl over the last 2 days. She had endorses a dry cough but denies fever, chills, wheezing, calf pain, leg swelling, immobilizations, exogenous estrogen or any hormonal birth control. She's never been hospitalized or intubated for her asthma. Since that she has extreme dyspnea on exertion cannot walk greater than 15 paces.  Past Medical History  Diagnosis Date  . 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 Trichomonas infection   . Bronchitis   . Asthma    Past Surgical History  Procedure Laterality Date  . No past surgeries     Family History  Problem Relation Age of Onset  . Anesthesia problems Neg Hx    History  Substance Use Topics  . Smoking status: Never Smoker   . Smokeless tobacco: Never Used  . Alcohol Use: No   OB History    Gravida Para Term Preterm AB TAB SAB Ectopic Multiple Living   3 3 3  0 0 0 0 0 0 2     Review of Systems  10 systems reviewed and found to be negative, except as noted in the HPI.   Allergies  Review of patient's allergies indicates no known allergies.  Home Medications   Prior to Admission medications   Medication Sig Start Date End Date Taking? Authorizing Provider  albuterol (PROVENTIL HFA;VENTOLIN HFA) 108 (90 BASE) MCG/ACT inhaler Inhale 2 puffs into the lungs every 6 (six) hours as needed for wheezing or shortness of breath.     Historical Provider, MD  albuterol (PROVENTIL HFA;VENTOLIN HFA) 108 (90 BASE) MCG/ACT inhaler Inhale 1-2 puffs into the lungs every 6 (six) hours as needed for wheezing or shortness of breath. 08/20/14   Mirian MoMatthew Gentry, MD  albuterol (PROVENTIL) (2.5 MG/3ML) 0.083% nebulizer solution Take 3 mLs (2.5 mg total) by nebulization every 6 (six) hours as needed for wheezing or shortness of breath. Patient not taking: Reported on 02/02/2015 07/28/14   Earley FavorGail Schulz, NP  predniSONE (DELTASONE) 50 MG tablet Take 1 tablet (50 mg total) by mouth daily. Patient not taking: Reported on 02/02/2015 08/20/14   Mirian MoMatthew Gentry, MD   BP 96/41 mmHg  Pulse 125  Temp(Src) 98.4 F (36.9 C) (Oral)  Resp 27  SpO2 98%  LMP 01/26/2015 Physical Exam  Constitutional: She is oriented to person, place, and time. She appears well-developed and well-nourished. No distress.  HENT:  Head: Normocephalic.  Mouth/Throat: Oropharynx is clear and moist.  Eyes: Conjunctivae and EOM are normal. Pupils are equal, round, and reactive to light.  Neck: Normal range of motion.  Cardiovascular: Regular rhythm and intact distal pulses.   Tachycardic cardiac in the 120s  Pulmonary/Chest: Effort normal. No stridor.  Tachypnea, Reduced or movement in all fields, patient is talking in short sentences, leaning forward  Abdominal: Soft. Bowel sounds are normal.  Musculoskeletal: Normal range of motion. She exhibits no edema or tenderness.  No calf asymmetry,  superficial collaterals, palpable cords, edema, Homans sign negative bilaterally.    Neurological: She is alert and oriented to person, place, and time.  Psychiatric: She has a normal mood and affect.  Nursing note and vitals reviewed.   ED Course  Procedures (including critical care time)  CRITICAL CARE Performed by: Wynetta Emery   Total critical care time: 45  Critical care time was exclusive of separately billable procedures and treating other patients.  Critical care  was necessary to treat or prevent imminent or life-threatening deterioration.  Critical care was time spent personally by me on the following activities: development of treatment plan with patient and/or surrogate as well as nursing, discussions with consultants, evaluation of patient's response to treatment, examination of patient, obtaining history from patient or surrogate, ordering and performing treatments and interventions, ordering and review of laboratory studies, ordering and review of radiographic studies, pulse oximetry and re-evaluation of patient's condition.  Labs Review Labs Reviewed  CBC WITH DIFFERENTIAL/PLATELET - Abnormal; Notable for the following:    WBC 12.6 (*)    Hemoglobin 11.4 (*)    HCT 34.6 (*)    Neutrophils Relative % 95 (*)    Neutro Abs 12.0 (*)    Lymphocytes Relative 4 (*)    Lymphs Abs 0.4 (*)    Monocytes Relative 1 (*)    All other components within normal limits  COMPREHENSIVE METABOLIC PANEL - Abnormal; Notable for the following:    Potassium 3.1 (*)    CO2 17 (*)    Glucose, Bld 149 (*)    Calcium 8.1 (*)    Alkaline Phosphatase 37 (*)    All other components within normal limits  D-DIMER, QUANTITATIVE - Abnormal; Notable for the following:    D-Dimer, Quant 0.73 (*)    All other components within normal limits  PREGNANCY, URINE    Imaging Review Dg Chest 2 View  02/02/2015   CLINICAL DATA:  24 year old female with history of asthma with new onset of shortness breath and wheezing since 4 a.m. Initial encounter.  EXAM: CHEST  2 VIEW  COMPARISON:  08/20/2014.  FINDINGS: Increased perihilar markings may represent reactive changes from asthma or mild bronchitis. No segmental consolidation or pneumothorax.  Heart size within normal limits.  IMPRESSION: Increased perihilar markings may represent reactive changes from asthma or mild bronchitis. No segmental consolidation or pneumothorax.   Electronically Signed   By: Lacy Duverney M.D.   On: 02/02/2015  07:42   Ct Angio Chest Pe W/cm &/or Wo Cm  02/02/2015   CLINICAL DATA:  24 year old female with shortness of breath, tachypnea, tachycardia, pleuritic chest pain. Initial encounter.  EXAM: CT ANGIOGRAPHY CHEST WITH CONTRAST  TECHNIQUE: Multidetector CT imaging of the chest was performed using the standard protocol during bolus administration of intravenous contrast. Multiplanar CT image reconstructions and MIPs were obtained to evaluate the vascular anatomy.  CONTRAST:  OMNIPAQUE IOHEXOL 350 MG/ML SOLN  COMPARISON:  Chest radiographs 02/02/2015 and earlier.  FINDINGS: Suboptimal contrast bolus timing in the pulmonary arterial tree. Superimposed mild respiratory motion, worse in the left lung. No central pulmonary artery filling defect. No hilar pulmonary artery filling defect. Segmental and subsegmental branches are not as well evaluated but appear to remain patent.  No pericardial or pleural effusion. 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 Otherwise negative visible aorta.  Anterior bilateral upper lobe multifocal peripheral ground-glass and early consolidation, 0 worse in the left lung (series 7, image 20). Major airways are patent. Mild peribronchial  thickening. Minimal atelectasis.  Small mediastinal and hilar lymph nodes are at the upper limits of normal or mildly reactive. No axillary lymphadenopathy. Negative thoracic inlet soft tissues.  Visualized liver, spleen, pancreas, adrenal glands, kidneys, and bowel in the upper abdomen are within normal limits.  No osseous abnormality identified.  Review of the MIP images confirms the above findings.  IMPRESSION: 1. Suboptimal contrast bolus timing and mild respiratory motion but no evidence of acute pulmonary embolus. 2. Left greater than right upper lobe distal airway infection / subpleural bronchopneumonia. Early consolidation on the left. No pleural effusion. 3. Aberrant right subclavian artery origin, normal anatomic  variation.   Electronically Signed   By: Odessa Fleming M.D.   On: 02/02/2015 09:35     EKG Interpretation   Date/Time:  Tuesday February 02 2015 03:28:42 EDT Ventricular Rate:  134 PR Interval:  132 QRS Duration: 92 QT Interval:  373 QTC Calculation: 557 R Axis:   70 Text Interpretation:  Sinus tachycardia Probable left atrial enlargement  RSR' in V1 or V2, probably normal variant Nonspecific repol abnormality,  diffuse leads Prolonged QT interval changes noted from prior Confirmed by  Bebe Shaggy  MD, DONALD ([id number]) on 02/02/2015 8:07:40 AM      MDM   Final diagnoses:  Asthma exacerbation  CAP (community acquired pneumonia)    Filed Vitals:   02/02/15 0649 02/02/15 0737 02/02/15 0845 02/02/15 0900  BP:    96/41  Pulse:   134 125  Temp: 98.4 F (36.9 C)     TempSrc: Oral     Resp:   27   SpO2:  96% 99% 98%    Medications  albuterol (PROVENTIL,VENTOLIN) solution continuous neb (10 mg/hr Nebulization New Bag/Given 02/02/15 0736)  sodium chloride 0.9 % bolus 2,000 mL (not administered)  cefTRIAXone (ROCEPHIN) 1 g in dextrose 5 % 50 mL IVPB (not administered)  azithromycin (ZITHROMAX) 500 mg in dextrose 5 % 250 mL IVPB (not administered)  ipratropium-albuterol (DUONEB) 0.5-2.5 (3) MG/3ML nebulizer solution 3 mL (3 mLs Nebulization Given 02/02/15 0736)  sodium chloride 0.9 % bolus 1,000 mL (1,000 mLs Intravenous New Bag/Given 02/02/15 0752)  iohexol (OMNIPAQUE) 350 MG/ML injection 100 mL (100 mLs Intravenous Contrast Given 02/02/15 0910)    Amanda Murillo is a pleasant 24 y.o. female presenting with shortness of breath, reduced or movement in all fields. Patient is tachypnea, tachycardic and her blood pressure soft. Initially there was a oxygen saturation of 72%, doubt this was accurate. CT to rule out PE is ordered, patient has already received magnesium, Solu-Medrol in route by EMS. She will be put on an hour-long nebulizer.  Patient has a leukocytosis of 12.6, her potassium is  decreased to 3.1, probably secondary to nebulizer. Chest x-rays without acute abnormalities, d-dimer is high, will order CT chest to rule out PE.  CAT scan shows a left sided infiltrate. Patient's blood pressure remained soft, will bolus 2 L, she remains tachycardic at 125. She will need admission for community-acquired pneumonia superimposed on asthma exacerbation.  Sepsis protocol initiated after results of CTA. Chest x-ray did not show any infiltrate, patient was afebrile, she is received 1 L of fluid, we'll draw lactic acid and put her in for 2 more liters. Blood cultures will also be drawn. Will call a level II sepsis code.  Patient states that she feels like her anxiety is kicking and when she can't breathe, will give her Ativan  This is a shared visit with the attending physician who personally evaluated  the patient and agrees with the care plan.   Pt will be admitted to step down bed by Dr. Jodie Echevaria Lazer Wollard, PA-C 02/02/15 0954  Wynetta Emery, PA-C 02/02/15 1019  Zadie Rhine, MD 02/03/15 [phone]

## 2015-02-02 NOTE — ED Notes (Signed)
Unable to obtain second set of blood cultures at this time due to admitting MD currently performing patient interview. Will obtain blood when MD leaves and will administer Abx after blood is obtained.

## 2015-02-02 NOTE — ED Notes (Signed)
Pt. returned from XR. 

## 2015-02-02 NOTE — Progress Notes (Signed)
UR completed 

## 2015-02-02 NOTE — ED Notes (Signed)
4 staff attempted to stick patient for blood without success. Antibiotics administered with one set of blood cultures drawn due to time delay and decreasing likelihood of obtaining blood cultures due lack of success by multiple qualified staff members.

## 2015-02-02 NOTE — ED Notes (Signed)
I attempted to collect second set of blood cultures and was unsuccessful.  I made nurse aware.

## 2015-02-02 NOTE — H&P (Addendum)
History and Physical  Amanda Murillo ZOX:[id number] DOB: 14-May-1991 DOA: 02/02/2015  Referring physician: Monico Blitz, ED PA-C & Dr. Ripley Fraise, [place] PCP: No PCP Per Patient  Outpatient Specialists:  1. None  Chief Complaint: Difficulty breathing.  HPI: Amanda Murillo is a 24 y.o. female with history of asthma since age 20 years, poorly controlled since she moved into her new house 3 years ago, presented to the ED on 02/02/15 with difficulty breathing. She states that she has asthma flareups up to 3 times weekly for the last 3 years. She uses her inhaler and nebulizer couple times daily. Lifelong nonsmoker. 3 weeks ago she had traveled to [place] where she had cough, dyspnea and wheezing which improved after using her home nebulizer. She returned from [place] 2-3 weeks ago and had an episode of throat pain "my glands were swollen" and sore throat which resolved spontaneously. Yesterday afternoon, she started having nonproductive cough, dyspnea, wheezing and chest tightness which was not relieved her home inhalers/nebulizers. She called EMS who came home and provided her breathing treatment with resolution of symptoms. She then went bowling and there she again started having similar symptoms and went home and tried her nebulizers without relief. She also reports 3-4 episodes of nonbloody emesis without abdominal pain or diarrhea. EMS was again called at approximately 3 AM. She was given duo nebs 2, magnesium 2 g and Solu-Medrol 125 MG. She arrived to the ED significantly tachycardic, tachypneic, soft blood pressures and oxygen saturation 72%. Due to her recent travel, positive d-dimer and concern for PE, CTA chest was obtained which showed no PE but did show infiltrate suggestive of community acquired pneumonia. Sepsis protocol was initiated. Patient states that her breathing has improved somewhat but still is white dyspneic. Potassium 3.1, bicarbonate 17, WBC 12.6, hemoglobin 11.4 and  lactate >7. Hospitalist admission requested.   Review of Systems: All systems reviewed and apart from history of presenting illness, are negative.  Past Medical History  Diagnosis Date  . 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 Trichomonas infection   . Bronchitis   . Asthma    Past Surgical History  Procedure Laterality Date  . No past surgeries     Social History:  reports that she has never smoked. She has never used smokeless tobacco. She reports that she does not drink alcohol or use illicit drugs. Single. Lives with her children at home. Independent of activities of daily living.  No Known Allergies  Family History  Problem Relation Age of Onset  . Anesthesia problems Neg Hx   . Stroke Mother     Prior to Admission medications   Medication Sig Start Date End Date Taking? Authorizing Provider  albuterol (PROVENTIL HFA;VENTOLIN HFA) 108 (90 BASE) MCG/ACT inhaler Inhale 1-2 puffs into the lungs every 6 (six) hours as needed for wheezing or shortness of breath. 08/20/14   Debby Freiberg, MD  albuterol (PROVENTIL) (2.5 MG/3ML) 0.083% nebulizer solution Take 3 mLs (2.5 mg total) by nebulization every 6 (six) hours as needed for wheezing or shortness of breath. Patient not taking: Reported on 02/02/2015 07/28/14   Junius Creamer, NP   Physical Exam: Filed Vitals:   02/02/15 0845 02/02/15 0900 02/02/15 0930 02/02/15 1030  BP:  96/41 103/48 109/59  Pulse: 134 125 120 121  Temp:      TempSrc:      Resp: 27     SpO2: 99% 98% 98% 100%   temperature: 98.58F.  General exam: Moderately built and nourished pleasant young female patient, lying propped up on the gurney in mild respiratory distress.  Head, eyes and ENT: Nontraumatic and normocephalic. Pupils equally reacting to light and accommodation. Oral mucosa slightly dry. Mild patchy erythema of posterior pharyngeal wall without exudates or enlarged tonsils.  Neck: Supple. No JVD, carotid bruit or  thyromegaly.  Lymphatics: No lymphadenopathy.  Respiratory system: Decreased breath sounds bilaterally with scattered bilateral medium pitched expiratory rhonchi. Mild increased work of breathing. Able to speak in short sentences.  Cardiovascular system: S1 and S2 heard, regular tachycardic. No JVD, murmurs, gallops, clicks or pedal edema.  Gastrointestinal system: Abdomen is nondistended, soft and nontender. Normal bowel sounds heard. No organomegaly or masses appreciated.  Central nervous system: Alert and oriented. No focal neurological deficits.  Extremities: Symmetric 5 x 5 power. Peripheral pulses symmetrically felt.   Skin: No rashes or acute findings.  Musculoskeletal system: Negative exam.  Psychiatry: Pleasant and cooperative.   Labs on Admission:  Basic Metabolic Panel:  Recent Labs Lab 02/02/15 0740  NA 139  K 3.1*  CL 112  CO2 17*  GLUCOSE 149*  BUN 10  CREATININE 0.75  CALCIUM 8.1*   Liver Function Tests:  Recent Labs Lab 02/02/15 0740  AST 27  ALT 10  ALKPHOS 37*  BILITOT 0.6  PROT 7.6  ALBUMIN 4.2   No results for input(s): LIPASE, AMYLASE in the last 168 hours. No results for input(s): AMMONIA in the last 168 hours. CBC:  Recent Labs Lab 02/02/15 0740  WBC 12.6*  NEUTROABS 12.0*  HGB 11.4*  HCT 34.6*  MCV 80.1  PLT 199   Cardiac Enzymes: No results for input(s): CKTOTAL, CKMB, CKMBINDEX, TROPONINI in the last 168 hours.  BNP (last 3 results) No results for input(s): PROBNP in the last 8760 hours. CBG: No results for input(s): GLUCAP in the last 168 hours.  Radiological Exams on Admission: Dg Chest 2 View  02/02/2015   CLINICAL DATA:  24 year old female with history of asthma with new onset of shortness breath and wheezing since 4 a.m. Initial encounter.  EXAM: CHEST  2 VIEW  COMPARISON:  08/20/2014.  FINDINGS: Increased perihilar markings may represent reactive changes from asthma or mild bronchitis. No segmental consolidation  or pneumothorax.  Heart size within normal limits.  IMPRESSION: Increased perihilar markings may represent reactive changes from asthma or mild bronchitis. No segmental consolidation or pneumothorax.   Electronically Signed   By: Genia Del M.D.   On: 02/02/2015 07:42   Ct Angio Chest Pe W/cm &/or Wo Cm  02/02/2015   CLINICAL DATA:  24 year old female with shortness of breath, tachypnea, tachycardia, pleuritic chest pain. Initial encounter.  EXAM: CT ANGIOGRAPHY CHEST WITH CONTRAST  TECHNIQUE: Multidetector CT imaging of the chest was performed using the standard protocol during bolus administration of intravenous contrast. Multiplanar CT image reconstructions and MIPs were obtained to evaluate the vascular anatomy.  CONTRAST:  168m OMNIPAQUE IOHEXOL 350 MG/ML SOLN  COMPARISON:  Chest radiographs 02/02/2015 and earlier.  FINDINGS: Suboptimal contrast bolus timing in the pulmonary arterial tree. Superimposed mild respiratory motion, worse in the left lung. No central pulmonary artery filling defect. No hilar pulmonary artery filling defect. Segmental and subsegmental branches are not as well evaluated but appear to remain patent.  No pericardial or pleural effusion. 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 Otherwise negative visible aorta.  Anterior bilateral upper lobe multifocal peripheral ground-glass and early consolidation, 0 worse in the left lung (series  7, image 20). Major airways are patent. Mild peribronchial thickening. Minimal atelectasis.  Small mediastinal and hilar lymph nodes are at the upper limits of normal or mildly reactive. No axillary lymphadenopathy. Negative thoracic inlet soft tissues.  Visualized liver, spleen, pancreas, adrenal glands, kidneys, and bowel in the upper abdomen are within normal limits.  No osseous abnormality identified.  Review of the MIP images confirms the above findings.  IMPRESSION: 1. Suboptimal contrast bolus timing and mild respiratory  motion but no evidence of acute pulmonary embolus. 2. Left greater than right upper lobe distal airway infection / subpleural bronchopneumonia. Early consolidation on the left. No pleural effusion. 3. Aberrant right subclavian artery origin, normal anatomic variation.   Electronically Signed   By: Genevie Ann M.D.   On: 02/02/2015 09:35    EKG: Independently reviewed. Sinus tachycardia at 1 34 bpm, normal axis, ST depression in inferior and lateral leads of unclear significance and RSR pattern in leads V1-2. QTC 557 ms.  Assessment/Plan 24 year old female patient with history of poorly controlled asthma, presented to [facility] ED on 02/02/15 with one-day history of progressively worsening dyspnea, nonproductive cough, wheezing and chest tightness. She met sepsis criteria, acute respiratory failure with hypoxia, chest x-ray negative for pneumonia but CTA chest confirmed infiltrates suggestive of bronchopneumonia/pneumonia and negative for PE.  Principal Problem:   CAP (community acquired pneumonia) - Admit to stepdown unit for close monitoring given presentation with sepsis and acute hypoxic respiratory failure. - Sepsis protocol initiated in ED including IV fluids and antibiotics. - Continue IV Rocephin and azithromycin. - Check influenza panel PCR, blood cultures, sputum culture, urine Legionella & streptococcal antigen and HIV antibody screen. - Placed on droplet isolation.  Active Problems:   Sepsis - Management as above as per sepsis protocol.    Acute respiratory failure with hypoxia - Treat supportively with oxygen, bronchodilators and treat underlying asthma exacerbation and pneumonia. - BiPAP when necessary. - If she does not improve or worsens, will consult CCM    Asthma exacerbation - Precipitated by pneumonia. - Treat with oxygen, bronchodilators, antibiotics and IV Solu-Medrol. - Consider pulmonology consultation prior to discharge regarding optimal management of asthma  and will need pulmonary follow-up as outpatient.    Hypokalemia - Secondary to vomiting and nebulizations. - Replace and follow BMP    Anemia - Chronic and stable.    Prolonged QT interval - Unclear etiology. - Monitor on telemetry. - Replace potassium and magnesium as needed. - Follow EKG   EKG changes - Unclear etiology of ST depression in inferior and lateral leads. - No ischemic type chest pain reported. - Low index of suspicion for CAD given very young age and no family history of CAD. - Cycle troponin.    Code Status: Full  Family Communication: Discussed with patient's boyfriend at bedside.  Disposition Plan: DC home when medically stable, possibly 2-3 days.  Time spent: 70 minutes.  Vernell Leep, MD, FACP, FHM. [facility] Pager [phone]  If 7PM-7AM, please contact night-coverage [email or web address] Password [facility] 02/02/2015, 11:20 AM

## 2015-02-02 NOTE — ED Notes (Signed)
Patient became extremely dyspneic with ambulation.  Coughing occasionally with nebulizer treatment, but it is nonproductive.

## 2015-02-02 NOTE — Progress Notes (Addendum)
Addendnum  Due to concerns of long QTC, changed Azithromycin to Doxycycline and decreased frequency on PRN Zofran.  Elevated lactate 7.37>4.1, likely d/t acute respiratory failure. Continue Mx per sepsis protocol and IVF  Urine pregnancy test: neg.  Flu panel PCR neg  Amanda Bascomb, MD, FACP, FHM. [facility] Pager [phone]  If 7PM-7AM, please contact night-coverage [email or web address] Password [email or web address] 02/02/2015, 6:21 PM

## 2015-02-03 ENCOUNTER — Inpatient Hospital Stay (HOSPITAL_COMMUNITY): Payer: Self-pay

## 2015-02-03 DIAGNOSIS — J180 Bronchopneumonia, unspecified organism: Secondary | ICD-10-CM

## 2015-02-03 DIAGNOSIS — R6520 Severe sepsis without septic shock: Secondary | ICD-10-CM

## 2015-02-03 LAB — CBC
HCT: 34.1 % — ABNORMAL LOW (ref 36.0–46.0)
Hemoglobin: 11.3 g/dL — ABNORMAL LOW (ref 12.0–15.0)
MCH: 26.4 pg (ref 26.0–34.0)
MCHC: 33.1 g/dL (ref 30.0–36.0)
MCV: 79.7 fL (ref 78.0–100.0)
PLATELETS: 198 10*3/uL (ref 150–400)
RBC: 4.28 MIL/uL (ref 3.87–5.11)
RDW: 13.7 % (ref 11.5–15.5)
WBC: 21.8 10*3/uL — ABNORMAL HIGH (ref 4.0–10.5)

## 2015-02-03 LAB — BASIC METABOLIC PANEL
ANION GAP: 7 (ref 5–15)
BUN: 6 mg/dL (ref 6–23)
CHLORIDE: 114 mmol/L — AB (ref 96–112)
CO2: 16 mmol/L — ABNORMAL LOW (ref 19–32)
CREATININE: 0.6 mg/dL (ref 0.50–1.10)
Calcium: 8.1 mg/dL — ABNORMAL LOW (ref 8.4–10.5)
GFR calc non Af Amer: >90 mL/min (ref >90)
Glucose, Bld: 137 mg/dL — ABNORMAL HIGH (ref 70–99)
POTASSIUM: 4.5 mmol/L (ref 3.5–5.1)
SODIUM: 137 mmol/L (ref 135–145)

## 2015-02-03 LAB — TROPONIN I
TROPONIN I: 0.03 ng/mL (ref <0.031)
Troponin I: 0.04 ng/mL — ABNORMAL HIGH (ref <0.031)
Troponin I: 0.04 ng/mL — ABNORMAL HIGH

## 2015-02-03 LAB — HIV ANTIBODY (ROUTINE TESTING W REFLEX): HIV SCREEN 4TH GENERATION: NONREACTIVE

## 2015-02-03 LAB — LEGIONELLA ANTIGEN, URINE

## 2015-02-03 MED ORDER — ALBUTEROL SULFATE (2.5 MG/3ML) 0.083% IN NEBU: 2.5000 mg | INHALATION_SOLUTION | Freq: Four times a day (QID) | RESPIRATORY_TRACT | Status: DC | PRN

## 2015-02-03 MED ORDER — IPRATROPIUM-ALBUTEROL 0.5-2.5 (3) MG/3ML IN SOLN
3.0000 mL | Freq: Three times a day (TID) | RESPIRATORY_TRACT | Status: DC
  Administered 2015-02-04: 3 mL via RESPIRATORY_TRACT
  Filled 2015-02-03: qty 3

## 2015-02-03 MED ORDER — IPRATROPIUM-ALBUTEROL 0.5-2.5 (3) MG/3ML IN SOLN
3.0000 mL | RESPIRATORY_TRACT | Status: DC
  Administered 2015-02-03 (×4): 3 mL via RESPIRATORY_TRACT
  Filled 2015-02-03 (×5): qty 3

## 2015-02-03 MED ORDER — METHYLPREDNISOLONE SODIUM SUCC 125 MG IJ SOLR
60.0000 mg | Freq: Two times a day (BID) | INTRAMUSCULAR | Status: DC
  Administered 2015-02-03 – 2015-02-04 (×2): 60 mg via INTRAVENOUS
  Filled 2015-02-03 (×2): qty 2

## 2015-02-03 NOTE — Progress Notes (Addendum)
Patient ID: Amanda Murillo, female   DOB: 1991/10/31, 24 y.o.   MRN: [id number] [facility] PROGRESS NOTE  Amanda Murillo UDJ:[id number] DOB: [birth year] DOA: 02/02/2015 PCP: No PCP Per Patient  Brief narrative:    24 y.o. female with history of asthma who presented to [facility] with worsening shortness of breath over past 24 hours prior to this admission. Patient used inhaler and nebulizer regimen at home couple of times a day for past few days with no significant symptomatic relief. Patient also reported having nonproductive cough, vomiting because of coughing. No reports of fevers.   She was seen by EMS given duo tabs 2, magnesium, Solu-Medrol. She came to emergency room.   In ED, patient was tachycardic, tachypneic, hypoxic with oxygen saturation in 70%. Blood work revealed leukocytosis of 21.8, hemoglobin 11.3. Lactic acid was elevated at 7.37. Chest x-ray showed increased perihilar markings which may represent reactive changes from asthma or mild bronchitis. CT angiogram of the chest did not reveal pulmonary embolism but it did show left greater than right upper lobe distal airway infection, subpleural bronchopneumonia, early consolidation on the left.  Patient was started on azithromycin and Rocephin. She was admitted to stepdown for further management of hypoxia and pneumonia.   Assessment/Plan:    Principal Problem: Acute respiratory failure with hypoxia / asthma exacerbation / bilateral upper lung lobe bronchopneumonia - Hypoxia likely secondary to combination of asthma exacerbation and bilateral bronchopneumonia. - Respiratory status is stable this morning. No wheezing appreciated. - Continue Solu-Medrol 60 mg IV but reduce the frequency from 4 times daily to 2 times daily - Continue duo neb every 4 hours scheduled and albuterol every 2 hours as needed for shortness of breath or wheezing - Continue Mucinex twice daily - Continue treatment for pneumonia with  doxycycline and azithromycin.   Active Problems:   Severe sepsis secondary to bronchopneumonia / leukocytosis - Sepsis criteria met on the admission with hypotension off 93/57, tachycardia, tachypnea, low-grade fever, leukocytosis of 21.8, elevated lactic acid at 7.37. - Sepsis protocol initiated on admission. - Patient was admitted to stepdown unit. Blood pressure improved to 123/89. - Continue doxycycline and Rocephin which were started on the admission. - Lactic acid trending down. - Blood cultures to date show no growth. HIV is nonreactive. Influenza is negative. Strep pneumonia is negative. Legionella is pending. - Patient is hemodynamically stable. Stable for transfer to telemetry floor.     Mild troponin elevation - No complaints of chest pain. Mild troponin elevation likely secondary to demand ischemia from hypoxia. - We'll cycle cardiac enzymes. Obtain 12-lead EKG.    Elevated D dimer - Obvious concern for pulmonary embolism. Patient had CT angiogram of the chest which was negative for pulmonary embolism.    Hypokalemia - Secondary to nebulizer treatments. Supplemented and within normal limits this morning.    Anemia, normocytic - Hemoglobin is 11.3. Stable. - No current indications for transfusion.    DVT Prophylaxis  - Lovenox subcutaneous sub Q ordered    Code Status: Full.  Family Communication:  plan of care discussed with the patient Disposition Plan: Home when stable.   IV access:  Peripheral IV  Procedures and diagnostic studies:    Dg Chest 2 View 02/02/2015 Increased perihilar markings may represent reactive changes from asthma or mild bronchitis. No segmental consolidation or pneumothorax.   Electronically Signed   By: Genia Del M.D.   On: 02/02/2015 07:42   Ct Angio Chest Pe W/cm &/or Wo Cm 02/02/2015  1. Suboptimal contrast bolus timing and mild respiratory motion but no evidence of acute pulmonary embolus. 2. Left greater than right upper lobe  distal airway infection / subpleural bronchopneumonia. Early consolidation on the left. No pleural effusion. 3. Aberrant right subclavian artery origin, normal anatomic variation.     Medical Consultants:  None   Other Consultants:  None   IAnti-Infectives:   Doxycycline 02/02/2015 --> Rocephin 02/02/2015 -->   [name], MD  [facility] Pager [phone]  If 7PM-7AM, please contact night-coverage [email or web address] Password [facility] 02/03/2015, 8:47 AM   LOS: 1 day    HPI/Subjective: No acute overnight events.  Objective: Filed Vitals:   02/03/15 0000 02/03/15 0320 02/03/15 0545 02/03/15 0800  BP:   93/57   Pulse:   97   Temp: 97.9 F (36.6 C)  99.9 F (37.7 C) 98.3 F (36.8 C)  TempSrc: Axillary   Oral  Resp:   16   Height:      Weight:      SpO2:  94% 98%     Intake/Output Summary (Last 24 hours) at 02/03/15 0847 Last data filed at 02/03/15 0300  Gross per 24 hour  Intake 2208.83 ml  Output    201 ml  Net 2007.83 ml    Exam:   General:  Pt is alert, follows commands appropriately, not in acute distress  Cardiovascular: Regular rate and rhythm, S1/S2 (+)  Respiratory: no wheezing, no crackles   Abdomen: Soft, non tender, non distended, bowel sounds present  Extremities: No edema, pulses DP and PT palpable bilaterally  Neuro: Grossly nonfocal  Data Reviewed: Basic Metabolic Panel:  Recent Labs Lab 02/02/15 0740 02/02/15 1330 02/03/15 0634  NA 139  --  137  K 3.1*  --  4.5  CL 112  --  114*  CO2 17*  --  16*  GLUCOSE 149*  --  137*  BUN 10  --  6  CREATININE 0.75  --  0.60  CALCIUM 8.1*  --  8.1*  MG  --  2.0  --    Liver Function Tests:  Recent Labs Lab 02/02/15 0740  AST 27  ALT 10  ALKPHOS 37*  BILITOT 0.6  PROT 7.6  ALBUMIN 4.2   No results for input(s): LIPASE, AMYLASE in the last 168 hours. No results for input(s): AMMONIA in the last 168 hours. CBC:  Recent Labs Lab 02/02/15 0740 02/03/15 0634  WBC 12.6* 21.8*   NEUTROABS 12.0*  --   HGB 11.4* 11.3*  HCT 34.6* 34.1*  MCV 80.1 79.7  PLT 199 198   Cardiac Enzymes:  Recent Labs Lab 02/02/15 1330 02/02/15 1735 02/02/15 2305  TROPONINI <0.03 0.03 0.04*   BNP: Invalid input(s): POCBNP CBG: No results for input(s): GLUCAP in the last 168 hours.  Blood culture (routine x 2)     Status: None (Preliminary result)   Collection Time: 02/02/15  9:58 AM  Result Value Ref Range Status   Specimen Description BLOOD LEFT FOREARM  Final   Special Requests BOTTLES DRAWN AEROBIC AND ANAEROBIC 5ML  Final   Culture   Final           BLOOD CULTURE RECEIVED NO GROWTH TO DATE    Report Status PENDING  Incomplete  MRSA PCR Screening     Status: None   Collection Time: 02/02/15  1:00 PM  Result Value Ref Range Status   MRSA by PCR NEGATIVE NEGATIVE Final  Blood culture (routine x 2)     Status:  None (Preliminary result)   Collection Time: 02/02/15  1:30 PM  Result Value Ref Range Status   Specimen Description BLOOD LEFT HAND  Final   Special Requests BOTTLES DRAWN AEROBIC ONLY 2 CC BLUE  Final   Culture   Final           BLOOD CULTURE RECEIVED NO GROWTH TO DATE    Report Status PENDING  Incomplete     Scheduled Meds: . cefTRIAXone  1 g Intravenous Q24H  . doxycycline  100 mg Oral Q12H  . enoxaparin (LOVENOX)  40 mg Subcutaneous Q24H  . famotidine  20 mg Oral BID  . guaiFENesin  1,200 mg Oral BID  . ipratropium-albuterol  3 mL Nebulization Q4H WA  . methylPREDNISolone   60 mg Intravenous Q6H   Continuous Infusions: . 0.9 % NaCl with KCl 40 mEq / L 125 mL/hr (02/03/15 0541)

## 2015-02-04 DIAGNOSIS — E876 Hypokalemia: Secondary | ICD-10-CM

## 2015-02-04 LAB — CBC
HCT: 36.4 % (ref 36.0–46.0)
HEMOGLOBIN: 11.8 g/dL — AB (ref 12.0–15.0)
MCH: 25.9 pg — ABNORMAL LOW (ref 26.0–34.0)
MCHC: 32.4 g/dL (ref 30.0–36.0)
MCV: 80 fL (ref 78.0–100.0)
Platelets: 226 10*3/uL (ref 150–400)
RBC: 4.55 MIL/uL (ref 3.87–5.11)
RDW: 13.7 % (ref 11.5–15.5)
WBC: 17.6 10*3/uL — ABNORMAL HIGH (ref 4.0–10.5)

## 2015-02-04 LAB — BASIC METABOLIC PANEL
Anion gap: 8 (ref 5–15)
BUN: 9 mg/dL (ref 6–23)
CO2: 21 mmol/L (ref 19–32)
Calcium: 8.5 mg/dL (ref 8.4–10.5)
Chloride: 108 mmol/L (ref 96–112)
Creatinine, Ser: 0.56 mg/dL (ref 0.50–1.10)
GFR calc Af Amer: >90 mL/min (ref >90)
Glucose, Bld: 158 mg/dL — ABNORMAL HIGH (ref 70–99)
Potassium: 4 mmol/L (ref 3.5–5.1)
Sodium: 137 mmol/L (ref 135–145)

## 2015-02-04 MED ORDER — ALBUTEROL SULFATE HFA 108 (90 BASE) MCG/ACT IN AERS: 1.0000 | INHALATION_SPRAY | Freq: Four times a day (QID) | RESPIRATORY_TRACT | Status: DC | PRN

## 2015-02-04 MED ORDER — PREDNISONE 5 MG PO TABS: 50.0000 mg | ORAL_TABLET | Freq: Every day | ORAL | Status: DC

## 2015-02-04 MED ORDER — LEVOFLOXACIN 750 MG PO TABS: 750.0000 mg | ORAL_TABLET | Freq: Every day | ORAL | Status: DC

## 2015-02-04 NOTE — Progress Notes (Signed)
Spoke with pt concerning MATCH program and PCP appointment. [facility]. Pt is aware. Teach back.

## 2015-02-04 NOTE — Discharge Instructions (Signed)
Asthma, Acute Bronchospasm °Acute bronchospasm caused by asthma is also referred to as an asthma attack. Bronchospasm means your air passages become narrowed. The narrowing is caused by inflammation and tightening of the muscles in the air tubes (bronchi) in your lungs. This can make it hard to breathe or cause you to wheeze and cough. °CAUSES °Possible triggers are: °· Animal dander from the skin, hair, or feathers of animals. °· Dust mites contained in house dust. °· Cockroaches. °· Pollen from trees or grass. °· Mold. °· Cigarette or tobacco smoke. °· Air pollutants such as dust, household cleaners, hair sprays, aerosol sprays, paint fumes, strong chemicals, or strong odors. °· Cold air or weather changes. Cold air may trigger inflammation. Winds increase molds and pollens in the air. °· Strong emotions such as crying or laughing hard. °· Stress. °· Certain medicines such as aspirin or beta-blockers. °· Sulfites in foods and drinks, such as dried fruits and wine. °· Infections or inflammatory conditions, such as a flu, cold, or inflammation of the nasal membranes (rhinitis). °· Gastroesophageal reflux disease (GERD). GERD is a condition where stomach acid backs up into your esophagus. °· Exercise or strenuous activity. °SIGNS AND SYMPTOMS  °· Wheezing. °· Excessive coughing, particularly at night. °· Chest tightness. °· Shortness of breath. °DIAGNOSIS  °Your health care provider will ask you about your medical history and perform a physical exam. A chest X-ray or blood testing may be performed to look for other causes of your symptoms or other conditions that may have triggered your asthma attack.  °TREATMENT  °Treatment is aimed at reducing inflammation and opening up the airways in your lungs.  Most asthma attacks are treated with inhaled medicines. These include quick relief or rescue medicines (such as bronchodilators) and controller medicines (such as inhaled corticosteroids). These medicines are sometimes  given through an inhaler or a nebulizer. Systemic steroid medicine taken by mouth or given through an IV tube also can be used to reduce the inflammation when an attack is moderate or severe. Antibiotic medicines are only used if a bacterial infection is present.  °HOME CARE INSTRUCTIONS  °· Rest. °· Drink plenty of liquids. This helps the mucus to remain thin and be easily coughed up. Only use caffeine in moderation and do not use alcohol until you have recovered from your illness. °· Do not smoke. Avoid being exposed to secondhand smoke. °· You play a critical role in keeping yourself in good health. Avoid exposure to things that cause you to wheeze or to have breathing problems. °· Keep your medicines up-to-date and available. Carefully follow your health care provider's treatment plan. °· Take your medicine exactly as prescribed. °· When pollen or pollution is bad, keep windows closed and use an air conditioner or go to places with air conditioning. °· Asthma requires careful medical care. See your health care provider for a follow-up as advised. If you are more than [redacted] weeks pregnant and you were prescribed any new medicines, let your obstetrician know about the visit and how you are doing. Follow up with your health care provider as directed. °· After you have recovered from your asthma attack, make an appointment with your outpatient doctor to talk about ways to reduce the likelihood of future attacks. If you do not have a doctor who manages your asthma, make an appointment with a primary care doctor to discuss your asthma. °SEEK IMMEDIATE MEDICAL CARE IF:  °· You are getting worse. °· You have trouble breathing. If severe, call your local   emergency services (911 in the [place] °· You develop chest pain or discomfort. °· You are vomiting. °· You are not able to keep fluids down. °· You are coughing up yellow, green, brown, or bloody sputum. °· You have a fever and your symptoms suddenly get worse. °· You have  trouble swallowing. °MAKE SURE YOU:  °· Understand these instructions. °· Will watch your condition. °· Will get help right away if you are not doing well or get worse. °Document Released: 02/07/2007 Document Revised: 10/28/2013 Document Reviewed: 04/30/2013 °[employer]® Patient Information ©2015 [employer]. This information is not intended to replace advice given to you by your health care provider. Make sure you discuss any questions you have with your health care provider. ° °Asthma Attack Prevention °Although there is no way to prevent asthma from starting, you can take steps to control the disease and reduce its symptoms. Learn about your asthma and how to control it. Take an active role to control your asthma by working with your health care provider to create and follow an asthma action plan. An asthma action plan guides you in: °· Taking your medicines properly. °· Avoiding things that set off your asthma or make your asthma worse (asthma triggers). °· Tracking your level of asthma control. °· Responding to worsening asthma. °· Seeking emergency care when needed. °To track your asthma, keep records of your symptoms, check your peak flow number using a handheld device that shows how well air moves out of your lungs (peak flow meter), and get regular asthma checkups.  °WHAT ARE SOME WAYS TO PREVENT AN ASTHMA ATTACK? °· Take medicines as directed by your health care provider. °· Keep track of your asthma symptoms and level of control. °· With your health care provider, write a detailed plan for taking medicines and managing an asthma attack. Then be sure to follow your action plan. Asthma is an ongoing condition that needs regular monitoring and treatment. °· Identify and avoid asthma triggers. Many outdoor allergens and irritants (such as pollen, mold, cold air, and air pollution) can trigger asthma attacks. Find out what your asthma triggers are and take steps to avoid them. °· Monitor your breathing. Learn  to recognize warning signs of an attack, such as coughing, wheezing, or shortness of breath. Your lung function may decrease before you notice any signs or symptoms, so regularly measure and record your peak airflow with a home peak flow meter. °· Identify and treat attacks early. If you act quickly, you are less likely to have a severe attack. You will also need less medicine to control your symptoms. When your peak flow measurements decrease and alert you to an upcoming attack, take your medicine as instructed and immediately stop any activity that may have triggered the attack. If your symptoms do not improve, get medical help. °· Pay attention to increasing quick-relief inhaler use. If you find yourself relying on your quick-relief inhaler, your asthma is not under control. See your health care provider about adjusting your treatment. °WHAT CAN MAKE MY SYMPTOMS WORSE? °A number of common things can set off or make your asthma symptoms worse and cause temporary increased inflammation of your airways. Keep track of your asthma symptoms for several weeks, detailing all the environmental and emotional factors that are linked with your asthma. When you have an asthma attack, go back to your asthma diary to see which factor, or combination of factors, might have contributed to it. Once you know what these factors are, you can   take steps to control many of them. If you have allergies and asthma, it is important to take asthma prevention steps at home. Minimizing contact with the substance to which you are allergic will help prevent an asthma attack. Some triggers and ways to avoid these triggers are: °Animal Dander:  °Some people are allergic to the flakes of skin or dried saliva from animals with fur or feathers.  °· There is no such thing as a hypoallergenic dog or cat breed. All dogs or cats can cause allergies, even if they don't shed. °· Keep these pets out of your home. °· If you are not able to keep a pet  outdoors, keep the pet out of your bedroom and other sleeping areas at all times, and keep the door closed. °· Remove carpets and furniture covered with cloth from your home. If that is not possible, keep the pet away from fabric-covered furniture and carpets. °Dust Mites: °Many people with asthma are allergic to dust mites. Dust mites are tiny bugs that are found in every home in mattresses, pillows, carpets, fabric-covered furniture, bedcovers, clothes, stuffed toys, and other fabric-covered items.  °· Cover your mattress in a special dust-proof cover. °· Cover your pillow in a special dust-proof cover, or wash the pillow each week in hot water. Water must be hotter than 130° F (54.4° C) to kill dust mites. Cold or warm water used with detergent and bleach can also be effective. °· Wash the sheets and blankets on your bed each week in hot water. °· Try not to sleep or lie on cloth-covered cushions. °· Call ahead when traveling and ask for a smoke-free hotel room. Bring your own bedding and pillows in case the hotel only supplies feather pillows and down comforters, which may contain dust mites and cause asthma symptoms. °· Remove carpets from your bedroom and those laid on concrete, if you can. °· Keep stuffed toys out of the bed, or wash the toys weekly in hot water or cooler water with detergent and bleach. °Cockroaches: °Many people with asthma are allergic to the droppings and remains of cockroaches.  °· Keep food and garbage in closed containers. Never leave food out. °· Use poison baits, traps, powders, gels, or paste (for example, boric acid). °· If a spray is used to kill cockroaches, stay out of the room until the odor goes away. °Indoor Mold: °· Fix leaky faucets, pipes, or other sources of water that have mold around them. °· Clean floors and moldy surfaces with a fungicide or diluted bleach. °· Avoid using humidifiers, vaporizers, or swamp coolers. These can spread molds through the air. °Pollen and  Outdoor Mold: °· When pollen or mold spore counts are high, try to keep your windows closed. °· Stay indoors with windows closed from late morning to afternoon. Pollen and some mold spore counts are highest at that time. °· Ask your health care provider whether you need to take anti-inflammatory medicine or increase your dose of the medicine before your allergy season starts. °Other Irritants to Avoid: °· Tobacco smoke is an irritant. If you smoke, ask your health care provider how you can quit. Ask family members to quit smoking, too. Do not allow smoking in your home or car. °· If possible, do not use a wood-burning stove, kerosene heater, or fireplace. Minimize exposure to all sources of smoke, including incense, candles, fires, and fireworks. °· Try to stay away from strong odors and sprays, such as perfume, talcum powder, hair spray, and   paints.  Decrease humidity in your home and use an indoor air cleaning device. Reduce indoor humidity to below 60%. Dehumidifiers or central air conditioners can do this.  Decrease house dust exposure by changing furnace and air cooler filters frequently.  Try to have someone else vacuum for you once or twice a week. Stay out of rooms while they are being vacuumed and for a short while afterward.  If you vacuum, use a dust mask from a hardware store, a double-layered or microfilter vacuum cleaner bag, or a vacuum cleaner with a HEPA filter.  Sulfites in foods and beverages can be irritants. Do not drink beer or wine or eat dried fruit, processed potatoes, or shrimp if they cause asthma symptoms.  Cold air can trigger an asthma attack. Cover your nose and mouth with a scarf on cold or windy days.  Several health conditions can make asthma more difficult to manage, including a runny nose, sinus infections, reflux disease, psychological stress, and sleep apnea. Work with your health care provider to manage these conditions.  Avoid close contact with people who have  a respiratory infection such as a cold or the flu, since your asthma symptoms may get worse if you catch the infection. Wash your hands thoroughly after touching items that may have been handled by people with a respiratory infection.  Get a flu shot every year to protect against the flu virus, which often makes asthma worse for days or weeks. Also get a pneumonia shot if you have not previously had one. Unlike the flu shot, the pneumonia shot does not need to be given yearly. Medicines:  Talk to your health care provider about whether it is safe for you to take aspirin or non-steroidal anti-inflammatory medicines (NSAIDs). In a small number of people with asthma, aspirin and NSAIDs can cause asthma attacks. These medicines must be avoided by people who have known aspirin-sensitive asthma. It is important that people with aspirin-sensitive asthma read labels of all over-the-counter medicines used to treat pain, colds, coughs, and fever.  Beta-blockers and ACE inhibitors are other medicines you should discuss with your health care provider. HOW CAN I FIND OUT WHAT I AM ALLERGIC TO? Ask your asthma health care provider about allergy skin testing or blood testing (the RAST test) to identify the allergens to which you are sensitive. If you are found to have allergies, the most important thing to do is to try to avoid exposure to any allergens that you are sensitive to as much as possible. Other treatments for allergies, such as medicines and allergy shots (immunotherapy) are available.  CAN I EXERCISE? Follow your health care provider's advice regarding asthma treatment before exercising. It is important to maintain a regular exercise program, but vigorous exercise or exercise in cold, humid, or dry environments can cause asthma attacks, especially for those people who have exercise-induced asthma. Document Released: 10/11/2009 Document Revised: 10/28/2013 Document Reviewed: 04/30/2013 [facility] Patient  Information 2015 [place], [place]. This information is not intended to replace advice given to you by your health care provider. Make sure you discuss any questions you have with your health care provider. Pneumonia Pneumonia is an infection of the lungs.  CAUSES Pneumonia may be caused by bacteria or a virus. Usually, these infections are caused by breathing infectious particles into the lungs (respiratory tract). SIGNS AND SYMPTOMS   Cough.  Fever.  Chest pain.  Increased rate of breathing.  Wheezing.  Mucus production. DIAGNOSIS  If you have the common symptoms of pneumonia,  your health care provider will typically confirm the diagnosis with a chest X-ray. The X-ray will show an abnormality in the lung (pulmonary infiltrate) if you have pneumonia. Other tests of your blood, urine, or sputum may be done to find the specific cause of your pneumonia. Your health care provider may also do tests (blood gases or pulse oximetry) to see how well your lungs are working. TREATMENT  Some forms of pneumonia may be spread to other people when you cough or sneeze. You may be asked to wear a mask before and during your exam. Pneumonia that is caused by bacteria is treated with antibiotic medicine. Pneumonia that is caused by the influenza virus may be treated with an antiviral medicine. Most other viral infections must run their course. These infections will not respond to antibiotics.  HOME CARE INSTRUCTIONS   Cough suppressants may be used if you are losing too much rest. However, coughing protects you by clearing your lungs. You should avoid using cough suppressants if you can.  Your health care provider may have prescribed medicine if he or she thinks your pneumonia is caused by bacteria or influenza. Finish your medicine even if you start to feel better.  Your health care provider may also prescribe an expectorant. This loosens the mucus to be coughed up.  Take medicines only as directed by your  health care provider.  Do not smoke. Smoking is a common cause of bronchitis and can contribute to pneumonia. If you are a smoker and continue to smoke, your cough may last several weeks after your pneumonia has cleared.  A cold steam vaporizer or humidifier in your room or home may help loosen mucus.  Coughing is often worse at night. Sleeping in a semi-upright position in a recliner or using a couple pillows under your head will help with this.  Get rest as you feel it is needed. Your body will usually let you know when you need to rest. PREVENTION A pneumococcal shot (vaccine) is available to prevent a common bacterial cause of pneumonia. This is usually suggested for:  People over 26 years old.  Patients on chemotherapy.  People with chronic lung problems, such as bronchitis or emphysema.  People with immune system problems. If you are over 65 or have a high risk condition, you may receive the pneumococcal vaccine if you have not received it before. In some countries, a routine influenza vaccine is also recommended. This vaccine can help prevent some cases of pneumonia.You may be offered the influenza vaccine as part of your care. If you smoke, it is time to quit. You may receive instructions on how to stop smoking. Your health care provider can provide medicines and counseling to help you quit. SEEK MEDICAL CARE IF: You have a fever. SEEK IMMEDIATE MEDICAL CARE IF:   Your illness becomes worse. This is especially true if you are elderly or weakened from any other disease.  You cannot control your cough with suppressants and are losing sleep.  You begin coughing up blood.  You develop pain which is getting worse or is uncontrolled with medicines.  Any of the symptoms which initially brought you in for treatment are getting worse rather than better.  You develop shortness of breath or chest pain. MAKE SURE YOU:   Understand these instructions.  Will watch your  condition.  Will get help right away if you are not doing well or get worse. Document Released: 10/23/2005 Document Revised: 03/09/2014 Document Reviewed: 01/12/2011 [employer] Patient Information 2015  [employer]. This information is not intended to replace advice given to you by your health care provider. Make sure you discuss any questions you have with your health care provider.

## 2015-02-04 NOTE — Discharge Summary (Signed)
Physician Discharge Summary  Amanda Murillo TKW:[id number] DOB: 27-Jun-1991 DOA: 02/02/2015  PCP: No PCP Per Patient  Admit date: 02/02/2015 Discharge date: 02/04/2015  Recommendations for Outpatient Follow-up:  1.Taper down prednisone starting from 50 mg a day, taper down by 5 mg a day down to 0 mg. for ex, today 50 mg, tomorrow 45 mg, then 40 mg the following day and etc... 2. Take Levaquin for 10 days on discharge for treatment of pneumonia. Follow-up with primary care physician in one week to make sure symptoms are stable.  Discharge Diagnoses:  Principal Problem:   CAP (community acquired pneumonia) Active Problems:   Asthma exacerbation   Sepsis   Hypokalemia   Anemia   Acute respiratory failure with hypoxia   Prolonged QT interval    Discharge Condition: stable   Diet recommendation: as tolerated   History of present illness: 24 y.o. female with history of asthma who presented to [facility] long hospital with worsening shortness of breath over past 24 hours prior to this admission. Patient used inhaler and nebulizer regimen at home couple of times a day for past few days with no significant symptomatic relief. Patient also reported having nonproductive cough, vomiting because of coughing. No reports of fevers.   She was seen by EMS given duo tabs 2, magnesium, Solu-Medrol. She came to emergency room.   In ED, patient was tachycardic, tachypneic, hypoxic with oxygen saturation in 70%. Blood work revealed leukocytosis of 21.8, hemoglobin 11.3. Lactic acid was elevated at 7.37. Chest x-ray showed increased perihilar markings which may represent reactive changes from asthma or mild bronchitis. CT angiogram of the chest did not reveal pulmonary embolism but it did show left greater than right upper lobe distal airway infection, subpleural bronchopneumonia, early consolidation on the left.  Patient was started on azithromycin and Rocephin. She was admitted to stepdown for further  management of hypoxia and pneumonia.   Assessment/Plan:    Principal Problem: Acute respiratory failure with hypoxia / asthma exacerbation / bilateral upper lung lobe bronchopneumonia - Hypoxia likely secondary to combination of asthma exacerbation and bilateral bronchopneumonia. - Respiratory status is stable. She will continue prednisone taper as prescribed on discharge. - Patient can continue albuterol inhaler and nebulizer as per prior home regimen. - Patient will continue taking Levaquin for 10 days on discharge for treatment of pneumonia   Active Problems:  Severe sepsis secondary to bronchopneumonia / leukocytosis - Sepsis criteria met on the admission with hypotension off 93/57, tachycardia, tachypnea, low-grade fever, leukocytosis of 21.8, elevated lactic acid at 7.37. - Sepsis protocol initiated on admission. - Patient was admitted to stepdown unit. Blood pressure improved to 123/89. - Patient was on doxycycline and Rocephin since the admission. Her lactic acid has trended down. - Blood cultures to date shows no growth. HIV is nonreactive.  - Influenza is negative. Strep pneumonia is negative. Legionella is negative.   Mild troponin elevation - No complaints of chest pain. Mild troponin elevation likely secondary to demand ischemia from hypoxia. - Troponin now within normal limits. No acute ischemic changes on 12-lead EKG.   Elevated D dimer - Obvious concern for pulmonary embolism. Patient had CT angiogram of the chest which was negative for pulmonary embolism.   Hypokalemia - Secondary to nebulizer treatments. Supplemented and within normal limits this morning.   Anemia, normocytic - Hemoglobin is 11.3. Stable. - No current indications for transfusion.   DVT Prophylaxis  - Lovenox subcutaneous sub Q ordered while patient in hospital.   Code Status:  Full.  Family Communication: plan of care discussed with the patient; family not at the bedside     IV access:  Peripheral IV  Procedures and diagnostic studies:   Dg Chest 2 View 02/02/2015 Increased perihilar markings may represent reactive changes from asthma or mild bronchitis. No segmental consolidation or pneumothorax. Electronically Signed By: Genia Del M.D. On: 02/02/2015 07:42   Ct Angio Chest Pe W/cm &/or Wo Cm 02/02/2015 1. Suboptimal contrast bolus timing and mild respiratory motion but no evidence of acute pulmonary embolus. 2. Left greater than right upper lobe distal airway infection / subpleural bronchopneumonia. Early consolidation on the left. No pleural effusion. 3. Aberrant right subclavian artery origin, normal anatomic variation.   Medical Consultants:  None   Other Consultants:  None   IAnti-Infectives:   Doxycycline 02/02/2015 --> 02/04/2015  Rocephin 02/02/2015 --> 02/04/2015  Signed:  [name], MD  [facility] 02/04/2015, 10:00 AM  Pager #: [phone]   Discharge Exam: Filed Vitals:   02/04/15 0541  BP: 93/48  Pulse: 62  Temp: 98.4 F (36.9 C)  Resp: 20   Filed Vitals:   02/03/15 1947 02/03/15 2138 02/04/15 0541 02/04/15 0841  BP:  109/63 93/48   Pulse:  89 62   Temp:  98.3 F (36.8 C) 98.4 F (36.9 C)   TempSrc:  Oral Oral   Resp:  21 20   Height:      Weight:      SpO2: 99% 100% 98% 96%    General: Pt is alert, follows commands appropriately, not in acute distress Cardiovascular: Regular rate and rhythm, S1/S2 +, no murmurs Respiratory: Clear to auscultation bilaterally, no wheezing, no crackles, no rhonchi Abdominal: Soft, non tender, non distended, bowel sounds +, no guarding Extremities: no edema, no cyanosis, pulses palpable bilaterally DP and PT Neuro: Grossly nonfocal  Discharge Instructions  Discharge Instructions    Call MD for:  difficulty breathing, headache or visual disturbances    Complete by:  As directed      Call MD for:  persistant nausea and vomiting    Complete by:   As directed      Call MD for:  severe uncontrolled pain    Complete by:  As directed      Diet - low sodium heart healthy    Complete by:  As directed      Discharge instructions    Complete by:  As directed   1.Taper down prednisone starting from 50 mg a day, taper down by 5 mg a day down to 0 mg. for ex, today 50 mg, tomorrow 45 mg, then 40 mg the following day and etc... 2. Take Levaquin for 10 days on discharge for treatment of pneumonia. Follow-up with primary care physician in one week to make sure symptoms are stable.     Increase activity slowly    Complete by:  As directed             Medication List    TAKE these medications        albuterol (2.5 MG/3ML) 0.083% nebulizer solution  Commonly known as:  PROVENTIL  Take 3 mLs (2.5 mg total) by nebulization every 6 (six) hours as needed for wheezing or shortness of breath.     albuterol 108 (90 BASE) MCG/ACT inhaler  Commonly known as:  PROVENTIL HFA;VENTOLIN HFA  Inhale 1-2 puffs into the lungs every 6 (six) hours as needed for wheezing or shortness of breath.     levofloxacin 750 MG  tablet  Commonly known as:  LEVAQUIN  Take 1 tablet (750 mg total) by mouth daily.     predniSONE 5 MG tablet  Commonly known as:  DELTASONE  - Take 10 tablets (50 mg total) by mouth daily with breakfast. Taper down prednisone starting from 50 mg a day, taper down by 5 mg a day down to 0 mg. for ex, today 50 mg, tomorrow 45 mg, then 40 mg the following day and etc...  - This is your new medication. Please take as indicated above.          The results of significant diagnostics from this hospitalization (including imaging, microbiology, ancillary and laboratory) are listed below for reference.    Significant Diagnostic Studies: X-ray Chest Pa And Lateral  02/03/2015   CLINICAL DATA:  Pneumonia.  Cough.  EXAM: CHEST  2 VIEW  COMPARISON:  CT chest 02/02/2015.  Chest x-ray 02/02/2015  FINDINGS: Mediastinum and hilar structures are normal.  Heart size normal. Mild new infiltrate in the right mid lung noted. Prior identified infiltrate in the left upper lung appears to have cleared. No pleural effusion or pneumothorax. Heart size normal. No acute osseous abnormality.  IMPRESSION: 1.  New infiltrate right mid lung consistent with pneumonia.  2. Previously identified left upper lobe infiltrate appears to have cleared.   Electronically Signed   By: Marcello Moores  Register   On: 02/03/2015 10:35   Dg Chest 2 View  02/02/2015   CLINICAL DATA:  24 year old female with history of asthma with new onset of shortness breath and wheezing since 4 a.m. Initial encounter.  EXAM: CHEST  2 VIEW  COMPARISON:  08/20/2014.  FINDINGS: Increased perihilar markings may represent reactive changes from asthma or mild bronchitis. No segmental consolidation or pneumothorax.  Heart size within normal limits.  IMPRESSION: Increased perihilar markings may represent reactive changes from asthma or mild bronchitis. No segmental consolidation or pneumothorax.   Electronically Signed   By: Genia Del M.D.   On: 02/02/2015 07:42   Ct Angio Chest Pe W/cm &/or Wo Cm  02/02/2015   CLINICAL DATA:  24 year old female with shortness of breath, tachypnea, tachycardia, pleuritic chest pain. Initial encounter.  EXAM: CT ANGIOGRAPHY CHEST WITH CONTRAST  TECHNIQUE: Multidetector CT imaging of the chest was performed using the standard protocol during bolus administration of intravenous contrast. Multiplanar CT image reconstructions and MIPs were obtained to evaluate the vascular anatomy.  CONTRAST:  189m OMNIPAQUE IOHEXOL 350 MG/ML SOLN  COMPARISON:  Chest radiographs 02/02/2015 and earlier.  FINDINGS: Suboptimal contrast bolus timing in the pulmonary arterial tree. Superimposed mild respiratory motion, worse in the left lung. No central pulmonary artery filling defect. No hilar pulmonary artery filling defect. Segmental and subsegmental branches are not as well evaluated but appear to remain  patent.  No pericardial or pleural effusion. 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 Otherwise negative visible aorta.  Anterior bilateral upper lobe multifocal peripheral ground-glass and early consolidation, 0 worse in the left lung (series 7, image 20). Major airways are patent. Mild peribronchial thickening. Minimal atelectasis.  Small mediastinal and hilar lymph nodes are at the upper limits of normal or mildly reactive. No axillary lymphadenopathy. Negative thoracic inlet soft tissues.  Visualized liver, spleen, pancreas, adrenal glands, kidneys, and bowel in the upper abdomen are within normal limits.  No osseous abnormality identified.  Review of the MIP images confirms the above findings.  IMPRESSION: 1. Suboptimal contrast bolus timing and mild respiratory motion but no evidence of acute  pulmonary embolus. 2. Left greater than right upper lobe distal airway infection / subpleural bronchopneumonia. Early consolidation on the left. No pleural effusion. 3. Aberrant right subclavian artery origin, normal anatomic variation.   Electronically Signed   By: Genevie Ann M.D.   On: 02/02/2015 09:35    Microbiology: Recent Results (from the past 240 hour(s))  Blood culture (routine x 2)     Status: None (Preliminary result)   Collection Time: 02/02/15  9:58 AM  Result Value Ref Range Status   Specimen Description BLOOD LEFT FOREARM  Final   Special Requests BOTTLES DRAWN AEROBIC AND ANAEROBIC 5ML  Final   Culture   Final           BLOOD CULTURE RECEIVED NO GROWTH TO DATE CULTURE WILL BE HELD FOR 5 DAYS BEFORE ISSUING A FINAL NEGATIVE REPORT Performed at [facility]    Report Status PENDING  Incomplete  MRSA PCR Screening     Status: None   Collection Time: 02/02/15  1:00 PM  Result Value Ref Range Status   MRSA by PCR NEGATIVE NEGATIVE Final  Blood culture (routine x 2)     Status: None (Preliminary result)   Collection Time: 02/02/15  1:30 PM  Result Value Ref  Range Status   Specimen Description BLOOD LEFT HAND  Final   Special Requests BOTTLES DRAWN AEROBIC ONLY 2 CC BLUE  Final   Culture   Final           BLOOD CULTURE RECEIVED NO GROWTH TO DATE CULTURE WILL BE HELD FOR 5 DAYS BEFORE ISSUING A FINAL NEGATIVE REPORT Performed at [facility]    Report Status PENDING  Incomplete     Labs: Basic Metabolic Panel:  Recent Labs Lab 02/02/15 0740 02/02/15 1330 02/03/15 0634 02/04/15 0437  NA 139  --  137 137  K 3.1*  --  4.5 4.0  CL 112  --  114* 108  CO2 17*  --  16* 21  GLUCOSE 149*  --  137* 158*  BUN 10  --  6 9  CREATININE 0.75  --  0.60 0.56  CALCIUM 8.1*  --  8.1* 8.5  MG  --  2.0  --   --    Liver Function Tests:  Recent Labs Lab 02/02/15 0740  AST 27  ALT 10  ALKPHOS 37*  BILITOT 0.6  PROT 7.6  ALBUMIN 4.2   No results for input(s): LIPASE, AMYLASE in the last 168 hours. No results for input(s): AMMONIA in the last 168 hours. CBC:  Recent Labs Lab 02/02/15 0740 02/03/15 0634 02/04/15 0437  WBC 12.6* 21.8* 17.6*  NEUTROABS 12.0*  --   --   HGB 11.4* 11.3* 11.8*  HCT 34.6* 34.1* 36.4  MCV 80.1 79.7 80.0  PLT 199 198 226   Cardiac Enzymes:  Recent Labs Lab 02/02/15 1330 02/02/15 1735 02/02/15 2305 02/03/15 0931 02/03/15 2120  TROPONINI <0.03 0.03 0.04* 0.04* 0.03   BNP: BNP (last 3 results) No results for input(s): BNP in the last 8760 hours.  ProBNP (last 3 results) No results for input(s): PROBNP in the last 8760 hours.  CBG: No results for input(s): GLUCAP in the last 168 hours.  Time coordinating discharge: Over 30 minutes

## 2015-02-08 ENCOUNTER — Ambulatory Visit: Payer: Self-pay | Attending: Family Medicine | Admitting: Family Medicine

## 2015-02-08 ENCOUNTER — Encounter: Payer: Self-pay | Admitting: Family Medicine

## 2015-02-08 VITALS — BP 95/63 | HR 70 | Temp 97.7°F | Resp 18 | Ht 59.0 in | Wt 144.0 lb

## 2015-02-08 DIAGNOSIS — J45901 Unspecified asthma with (acute) exacerbation: Secondary | ICD-10-CM

## 2015-02-08 DIAGNOSIS — J189 Pneumonia, unspecified organism: Secondary | ICD-10-CM | POA: Insufficient documentation

## 2015-02-08 DIAGNOSIS — Z9114 Patient's other noncompliance with medication regimen: Secondary | ICD-10-CM | POA: Insufficient documentation

## 2015-02-08 DIAGNOSIS — K2970 Gastritis, unspecified, without bleeding: Secondary | ICD-10-CM | POA: Insufficient documentation

## 2015-02-08 DIAGNOSIS — K2990 Gastroduodenitis, unspecified, without bleeding: Secondary | ICD-10-CM

## 2015-02-08 DIAGNOSIS — R0902 Hypoxemia: Secondary | ICD-10-CM | POA: Insufficient documentation

## 2015-02-08 DIAGNOSIS — Z7952 Long term (current) use of systemic steroids: Secondary | ICD-10-CM | POA: Insufficient documentation

## 2015-02-08 DIAGNOSIS — J45909 Unspecified asthma, uncomplicated: Secondary | ICD-10-CM | POA: Insufficient documentation

## 2015-02-08 LAB — CULTURE, BLOOD (ROUTINE X 2)
Culture: NO GROWTH
Culture: NO GROWTH

## 2015-02-08 MED ORDER — BECLOMETHASONE DIPROPIONATE 40 MCG/ACT IN AERS: 2.0000 | INHALATION_SPRAY | Freq: Two times a day (BID) | RESPIRATORY_TRACT | Status: DC

## 2015-02-08 MED ORDER — ALBUTEROL SULFATE HFA 108 (90 BASE) MCG/ACT IN AERS: 1.0000 | INHALATION_SPRAY | Freq: Four times a day (QID) | RESPIRATORY_TRACT | Status: DC | PRN

## 2015-02-08 MED ORDER — ALBUTEROL SULFATE (2.5 MG/3ML) 0.083% IN NEBU: 2.5000 mg | INHALATION_SOLUTION | Freq: Four times a day (QID) | RESPIRATORY_TRACT | Status: DC | PRN

## 2015-02-08 MED ORDER — LEVOFLOXACIN 750 MG PO TABS: 750.0000 mg | ORAL_TABLET | Freq: Every day | ORAL | Status: DC

## 2015-02-08 MED ORDER — OMEPRAZOLE 40 MG PO CPDR: 40.0000 mg | DELAYED_RELEASE_CAPSULE | Freq: Every day | ORAL | Status: DC

## 2015-02-08 NOTE — Progress Notes (Signed)
Patient here for hospital follow up, pneumonia. Patient has not been taking medications from hospital discharge because they were too expensive. Patient would like to use pharmacy here.  Patient reports feeling achy over "whole body" since leaving the hospital.

## 2015-02-08 NOTE — Patient Instructions (Signed)
Asthma  Asthma is a recurring condition in which the airways tighten and narrow. Asthma can make it difficult to breathe. It can cause coughing, wheezing, and shortness of breath. Asthma episodes, also called asthma attacks, range from minor to life-threatening. Asthma cannot be cured, but medicines and lifestyle changes can help control it.  CAUSES  Asthma is believed to be caused by inherited (genetic) and environmental factors, but its exact cause is unknown. Asthma may be triggered by allergens, lung infections, or irritants in the air. Asthma triggers are different for each person. Common triggers include:    Animal dander.   Dust mites.   Cockroaches.   Pollen from trees or grass.   Mold.   Smoke.   Air pollutants such as dust, household cleaners, hair sprays, aerosol sprays, paint fumes, strong chemicals, or strong odors.   Cold air, weather changes, and winds (which increase molds and pollens in the air).   Strong emotional expressions such as crying or laughing hard.   Stress.   Certain medicines (such as aspirin) or types of drugs (such as beta-blockers).   Sulfites in foods and drinks. Foods and drinks that may contain sulfites include dried fruit, potato chips, and sparkling grape juice.   Infections or inflammatory conditions such as the flu, a cold, or an inflammation of the nasal membranes (rhinitis).   Gastroesophageal reflux disease (GERD).   Exercise or strenuous activity.  SYMPTOMS  Symptoms may occur immediately after asthma is triggered or many hours later. Symptoms include:   Wheezing.   Excessive nighttime or early morning coughing.   Frequent or severe coughing with a common cold.   Chest tightness.   Shortness of breath.  DIAGNOSIS   The diagnosis of asthma is made by a review of your medical history and a physical exam. Tests may also be performed. These may include:   Lung function studies. These tests show how much air you breathe in and out.   Allergy  tests.   Imaging tests such as X-rays.  TREATMENT   Asthma cannot be cured, but it can usually be controlled. Treatment involves identifying and avoiding your asthma triggers. It also involves medicines. There are 2 classes of medicine used for asthma treatment:    Controller medicines. These prevent asthma symptoms from occurring. They are usually taken every day.   Reliever or rescue medicines. These quickly relieve asthma symptoms. They are used as needed and provide short-term relief.  Your health care provider will help you create an asthma action plan. An asthma action plan is a written plan for managing and treating your asthma attacks. It includes a list of your asthma triggers and how they may be avoided. It also includes information on when medicines should be taken and when their dosage should be changed. An action plan may also involve the use of a device called a peak flow meter. A peak flow meter measures how well the lungs are working. It helps you monitor your condition.  HOME CARE INSTRUCTIONS    Take medicines only as directed by your health care provider. Speak with your health care provider if you have questions about how or when to take the medicines.   Use a peak flow meter as directed by your health care provider. Record and keep track of readings.   Understand and use the action plan to help minimize or stop an asthma attack without needing to seek medical care.   Control your home environment in the following ways to help   prevent asthma attacks:   Do not smoke. Avoid being exposed to secondhand smoke.   Change your heating and air conditioning filter regularly.   Limit your use of fireplaces and wood stoves.   Get rid of pests (such as roaches and mice) and their droppings.   Throw away plants if you see mold on them.   Clean your floors and dust regularly. Use unscented cleaning products.   Try to have someone else vacuum for you regularly. Stay out of rooms while they are  being vacuumed and for a short while afterward. If you vacuum, use a dust mask from a hardware store, a double-layered or microfilter vacuum cleaner bag, or a vacuum cleaner with a HEPA filter.   Replace carpet with wood, tile, or vinyl flooring. Carpet can trap dander and dust.   Use allergy-proof pillows, mattress covers, and box spring covers.   Wash bed sheets and blankets every week in hot water and dry them in a dryer.   Use blankets that are made of polyester or cotton.   Clean bathrooms and kitchens with bleach. If possible, have someone repaint the walls in these rooms with mold-resistant paint. Keep out of the rooms that are being cleaned and painted.   Wash hands frequently.  SEEK MEDICAL CARE IF:    You have wheezing, shortness of breath, or a cough even if taking medicine to prevent attacks.   The colored mucus you cough up (sputum) is thicker than usual.   Your sputum changes from clear or white to yellow, green, gray, or bloody.   You have any problems that may be related to the medicines you are taking (such as a rash, itching, swelling, or trouble breathing).   You are using a reliever medicine more than 2-3 times per week.   Your peak flow is still at 50-79% of your personal best after following your action plan for 1 hour.   You have a fever.  SEEK IMMEDIATE MEDICAL CARE IF:    You seem to be getting worse and are unresponsive to treatment during an asthma attack.   You are short of breath even at rest.   You get short of breath when doing very little physical activity.   You have difficulty eating, drinking, or talking due to asthma symptoms.   You develop chest pain.   You develop a fast heartbeat.   You have a bluish color to your lips or fingernails.   You are light-headed, dizzy, or faint.   Your peak flow is less than 50% of your personal best.  MAKE SURE YOU:    Understand these instructions.   Will watch your condition.   Will get help right away if you are not  doing well or get worse.  Document Released: 10/23/2005 Document Revised: 03/09/2014 Document Reviewed: 05/22/2013  Exit[employer] Patient Information 2015 [employer]. This information is not intended to replace advice given to you by your health care provider. Make sure you discuss any questions you have with your health care provider.  Pneumonia  Pneumonia is an infection of the lungs.   CAUSES  Pneumonia may be caused by bacteria or a virus. Usually, these infections are caused by breathing infectious particles into the lungs (respiratory tract).  SIGNS AND SYMPTOMS    Cough.   Fever.   Chest pain.   Increased rate of breathing.   Wheezing.   Mucus production.  DIAGNOSIS   If you have the common symptoms of pneumonia, your health care   provider will typically confirm the diagnosis with a chest X-ray. The X-ray will show an abnormality in the lung (pulmonary infiltrate) if you have pneumonia. Other tests of your blood, urine, or sputum may be done to find the specific cause of your pneumonia. Your health care provider may also do tests (blood gases or pulse oximetry) to see how well your lungs are working.  TREATMENT   Some forms of pneumonia may be spread to other people when you cough or sneeze. You may be asked to wear a mask before and during your exam. Pneumonia that is caused by bacteria is treated with antibiotic medicine. Pneumonia that is caused by the influenza virus may be treated with an antiviral medicine. Most other viral infections must run their course. These infections will not respond to antibiotics.   HOME CARE INSTRUCTIONS    Cough suppressants may be used if you are losing too much rest. However, coughing protects you by clearing your lungs. You should avoid using cough suppressants if you can.   Your health care provider may have prescribed medicine if he or she thinks your pneumonia is caused by bacteria or influenza. Finish your medicine even if you start to feel better.   Your  health care provider may also prescribe an expectorant. This loosens the mucus to be coughed up.   Take medicines only as directed by your health care provider.   Do not smoke. Smoking is a common cause of bronchitis and can contribute to pneumonia. If you are a smoker and continue to smoke, your cough may last several weeks after your pneumonia has cleared.   A cold steam vaporizer or humidifier in your room or home may help loosen mucus.   Coughing is often worse at night. Sleeping in a semi-upright position in a recliner or using a couple pillows under your head will help with this.   Get rest as you feel it is needed. Your body will usually let you know when you need to rest.  PREVENTION  A pneumococcal shot (vaccine) is available to prevent a common bacterial cause of pneumonia. This is usually suggested for:   People over 65 years old.   Patients on chemotherapy.   People with chronic lung problems, such as bronchitis or emphysema.   People with immune system problems.  If you are over 65 or have a high risk condition, you may receive the pneumococcal vaccine if you have not received it before. In some countries, a routine influenza vaccine is also recommended. This vaccine can help prevent some cases of pneumonia.You may be offered the influenza vaccine as part of your care.  If you smoke, it is time to quit. You may receive instructions on how to stop smoking. Your health care provider can provide medicines and counseling to help you quit.  SEEK MEDICAL CARE IF:  You have a fever.  SEEK IMMEDIATE MEDICAL CARE IF:    Your illness becomes worse. This is especially true if you are elderly or weakened from any other disease.   You cannot control your cough with suppressants and are losing sleep.   You begin coughing up blood.   You develop pain which is getting worse or is uncontrolled with medicines.   Any of the symptoms which initially brought you in for treatment are getting worse rather than  better.   You develop shortness of breath or chest pain.  MAKE SURE YOU:    Understand these instructions.   Will watch your condition.     Will get help right away if you are not doing well or get worse.  Document Released: 10/23/2005 Document Revised: 03/09/2014 Document Reviewed: 01/12/2011  [employer] Patient Information 2015 [employer]. This information is not intended to replace advice given to you by your health care provider. Make sure you discuss any questions you have with your health care provider.

## 2015-02-08 NOTE — Progress Notes (Signed)
Subjective:    Patient ID: Sanjuana LettersSapphire A Veley, female    DOB: 02/24/1991, 24 y.o.   MRN: [id number]  HPI  24 year old female admitted with [facility] between 02/02/2015-02/04/2015 after she had presented with shortness of breath, cough and was found to be tachycardic tachypneic and hypoxic with an oxygen saturation of 70% on presentation.  Her blood count revealed leukocytosis of 21.8 hemoglobin of 11.3, lactic acid at 7.37 Influenza A and B ned, H1N1 neg, blood cultures neg. Chest x-ray was in keeping with reactive changes from asthma mild bronchitis and CT and she was negative for PE but did reveal bilateral bronchopneumonia. EKG revealed prolonged QTC and ST depression thought to be secondary to demand ischemia from hypoxia. She was admitted to step down unit and sepsis protocol observed she did meet the criteria. She was treated with oxygen and bronchodilators, IV Rocephin and azithromycin and placed on droplet isolation  Interval History:  She is yet to commence a prednisone taper and oral Levaquin due to financial, and strains. She does complain of feeling achy and Flexeril heavy she has also noticed some epigastric tenderness. Of note she does admit to some weight gain which I have explained her could be attributed to her steroid use. Otherwise she denies any respiratory symptoms or fevers.  Significant diagnostic test:  CT angio chest:  IMPRESSION: 1. Suboptimal contrast bolus timing and mild respiratory motion but no evidence of acute pulmonary embolus. 2. Left greater than right upper lobe distal airway infection / subpleural bronchopneumonia. Early consolidation on the left. No pleural effusion. 3. Aberrant right subclavian artery origin, normal anatomic Variation.  CXR IMPRESSION: Increased perihilar markings may represent reactive changes from asthma or mild bronchitis. No segmental consolidation or Pneumothorax.  Lab Results  Component Value Date   WBC 17.6*  02/04/2015   HGB 11.8* 02/04/2015   HCT 36.4 02/04/2015   MCV 80.0 02/04/2015   PLT 226 02/04/2015   Lab Results  Component Value Date   WBC 17.6* 02/04/2015   HGB 11.8* 02/04/2015   HCT 36.4 02/04/2015   MCV 80.0 02/04/2015   PLT 226 02/04/2015   Lab Results  Component Value Date   DDIMER 0.73* 02/02/2015    Past Medical History  Diagnosis Date  . 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 Trichomonas infection   . Bronchitis   . Asthma     Past Surgical History  Procedure Laterality Date  . No past surgeries      No Known Allergies  Current Outpatient Prescriptions on File Prior to Visit  Medication Sig Dispense Refill  . predniSONE (DELTASONE) 5 MG tablet Take 10 tablets (50 mg total) by mouth daily with breakfast. Taper down prednisone starting from 50 mg a day, taper down by 5 mg a day down to 0 mg. for ex, today 50 mg, tomorrow 45 mg, then 40 mg the following day and etc... This is your new medication. Please take as indicated above. 55 tablet 0   No current facility-administered medications on file prior to visit.      Review of Systems  Constitutional: Negative for activity change, appetite change and fatigue.  HENT: Negative for congestion, sinus pressure and sore throat.   Eyes: Negative for visual disturbance.  Respiratory: Negative for cough, chest tightness, shortness of breath and wheezing.   Cardiovascular: Negative for chest pain and palpitations.  Gastrointestinal: Positive for abdominal pain. Negative for constipation and abdominal distention.  Endocrine: Negative for polydipsia.  Genitourinary: Negative for dysuria and frequency.  Musculoskeletal: Positive for myalgias. Negative for back pain and arthralgias.  Skin: Negative for rash.  Neurological: Negative for tremors, light-headedness and numbness.  Hematological: Does not bruise/bleed easily.  Psychiatric/Behavioral: Negative for behavioral problems and agitation.        Objective:   Physical Exam  Constitutional: She is oriented to person, place, and time. She appears well-developed and well-nourished. No distress.  HENT:  Head: Normocephalic.  Right Ear: External ear normal.  Left Ear: External ear normal.  Nose: Nose normal.  Mouth/Throat: Oropharynx is clear and moist.  Eyes: Conjunctivae and EOM are normal. Pupils are equal, round, and reactive to light.  Neck: Normal range of motion. No JVD present.  Cardiovascular: Normal rate, regular rhythm, normal heart sounds and intact distal pulses.  Exam reveals no gallop.   No murmur heard. Pulmonary/Chest: Effort normal and breath sounds normal. No respiratory distress. She has no wheezes. She has no rales. She exhibits no tenderness.  Abdominal: Soft. Bowel sounds are normal. She exhibits no distension and no mass. There is tenderness in the epigastric area.  Musculoskeletal: Normal range of motion. She exhibits no edema or tenderness.  Neurological: She is alert and oriented to person, place, and time. She has normal reflexes.  Skin: Skin is warm and dry. She is not diaphoretic.  Psychiatric: She has a normal mood and affect.          Assessment & Plan:  24 year old female patient with a history of asthma recently admitted for community-acquired pneumonia and acute respiratory area with hypoxia who is yet to begin her steroid  taper and has not commenced her Levaquin due to financial constraints.  Community-acquired pneumonia: Resolving. Advised to commenced steroid taper and also antibiotics. She did have leukocytosis of the time of discharge and I would love to repeat a CBC after she is done with her antibiotic course to evaluate for complete resolution. Bodyaches could also be secondary to this; she tested negative for flu H1 N1.  Asthma; Oxygen saturation is good at this time I am commencing her on inhaled corticosteroids and she will continue metered-dose inhaler as needed. She  would need to be evaluated at her next office visit for improvement of symptoms.  Gastritis: This is likely steroid-induced and so I am placing her on omeprazole.

## 2015-03-10 ENCOUNTER — Ambulatory Visit: Payer: Self-pay | Admitting: Family Medicine

## 2016-02-13 IMAGING — CR DG CHEST 2V
2 series · 2 of 2 positions shown · non-contrast
Comparison: 08/20/2014.

CLINICAL DATA: 23-year-old female with history of asthma with new
onset of shortness breath and wheezing since 4 a.m.. Initial
encounter.

EXAM:
CHEST  2 VIEW

[w chest lat]
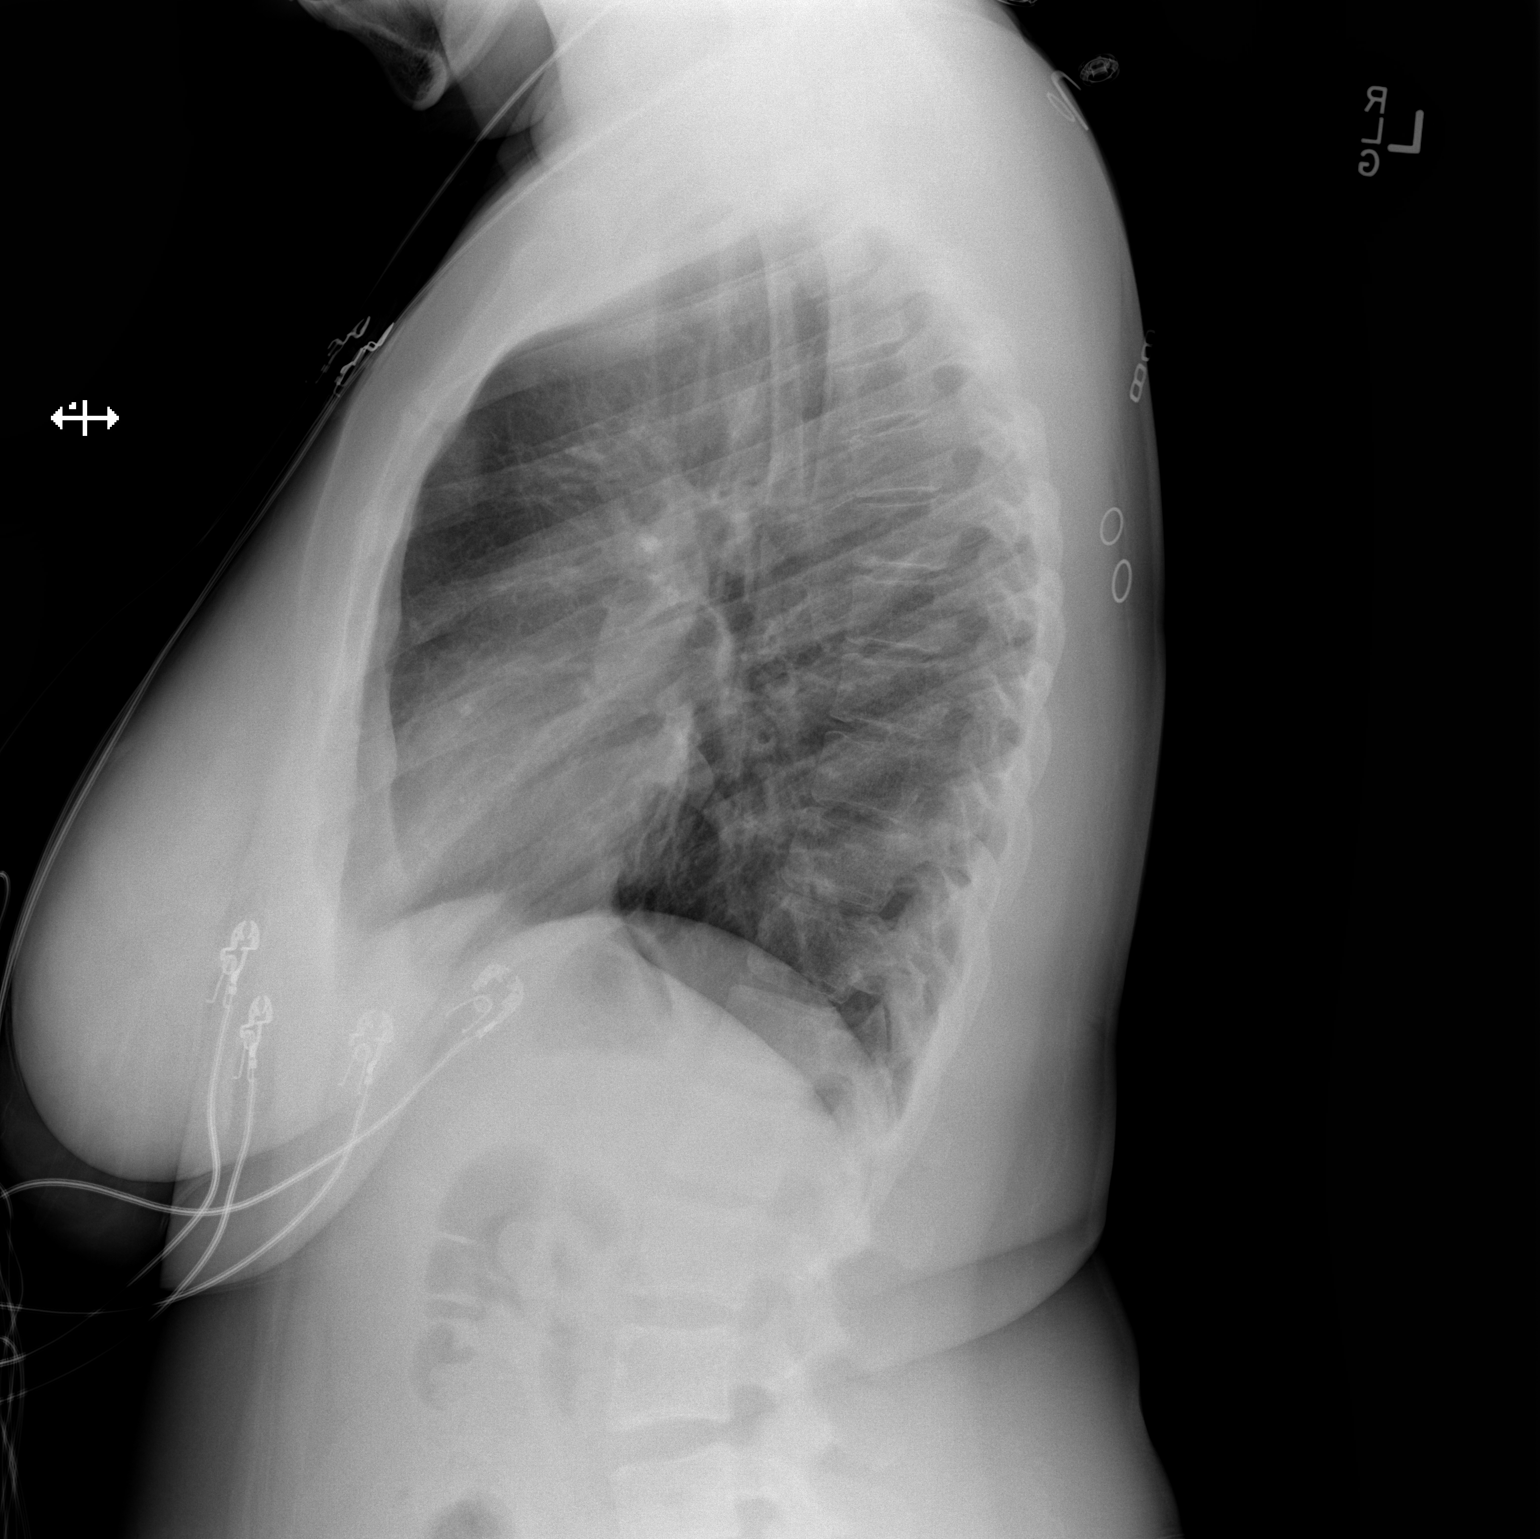

[w chest pa]
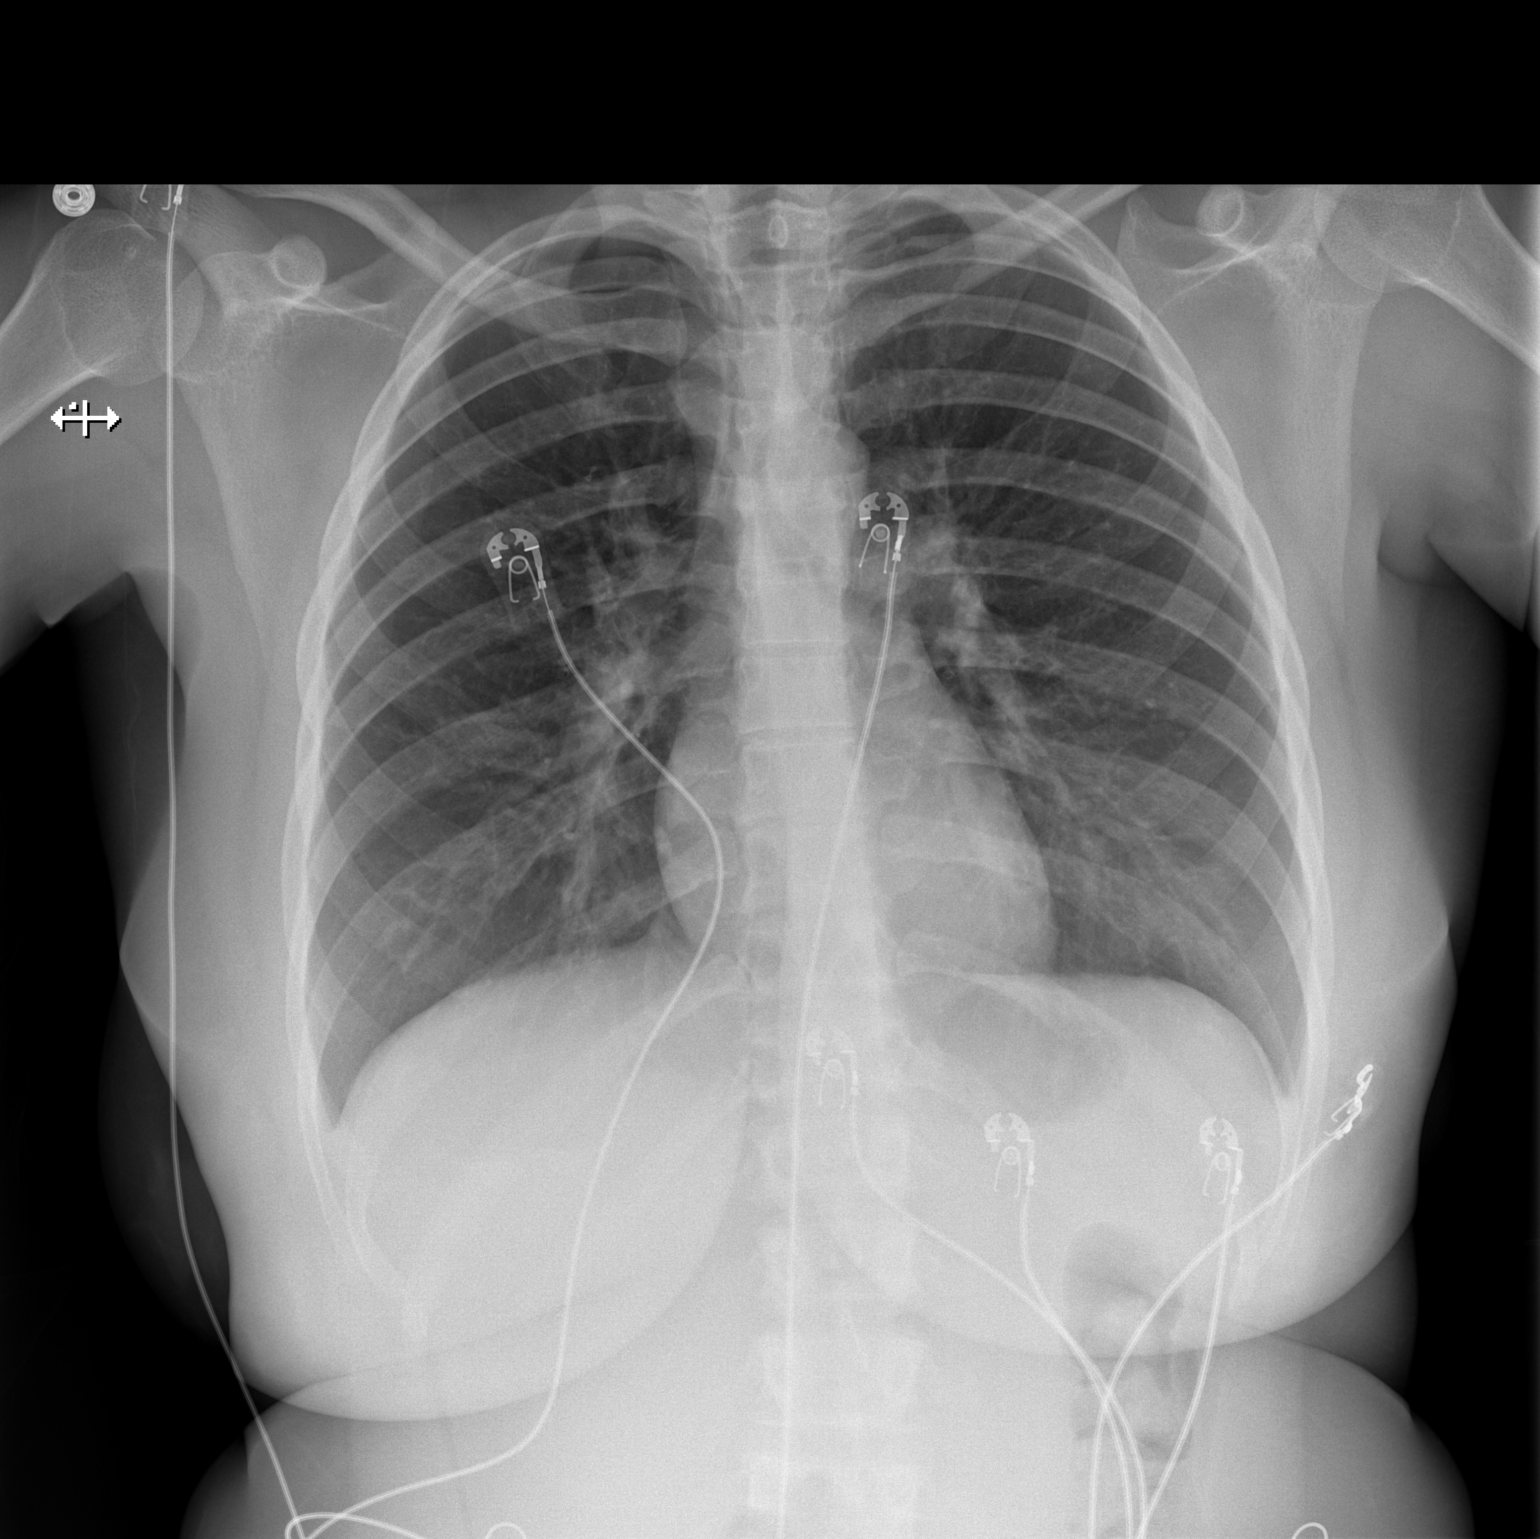

[2 of 2 positions shown; findings below may reference images not displayed]

FINDINGS: Increased perihilar markings may represent reactive changes from
asthma or mild bronchitis. No segmental consolidation or
pneumothorax.

Heart size within normal limits.
IMPRESSION: Increased perihilar markings may represent reactive changes from
asthma or mild bronchitis. No segmental consolidation or
pneumothorax.

## 2016-02-13 IMAGING — CT CT ANGIO CHEST
1 of 2 series · 19 of 32 positions shown · IV contrast (OMNIPAQUE 350)
Comparison: Chest radiographs 02/02/2015 and earlier.

CLINICAL DATA: 23-year-old female with shortness of breath,
tachypnea, tachycardia, pleuritic chest pain. Initial encounter.

EXAM:
CT ANGIOGRAPHY CHEST WITH CONTRAST
TECHNIQUE: Multidetector CT imaging of the chest was performed using the
standard protocol during bolus administration of intravenous
contrast. Multiplanar CT image reconstructions and MIPs were
obtained to evaluate the vascular anatomy.
CONTRAST:  100mL OMNIPAQUE IOHEXOL 350 MG/ML SOLN

[Series 6: thins for pacs · axial · 0.65mm/px · z∈[-218,-5]mm · 19 of 237 slices shown]
[im 12/237  lung]
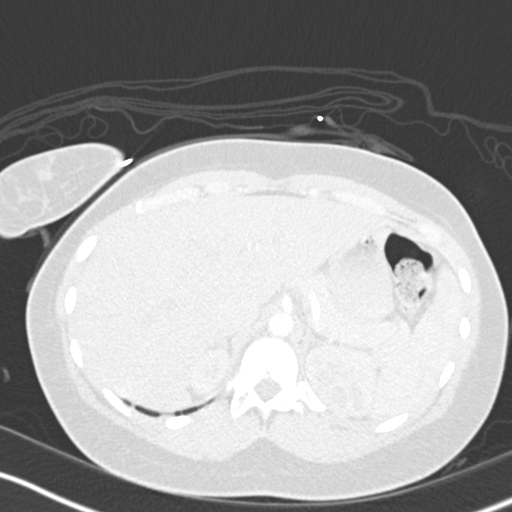
[im 24/237  mediastinal]
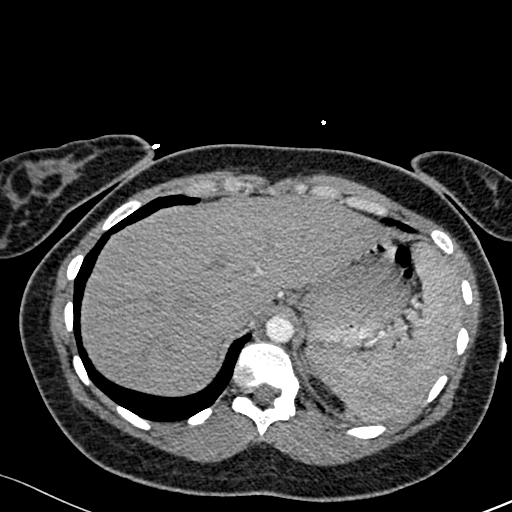
[im 36/237  lung]
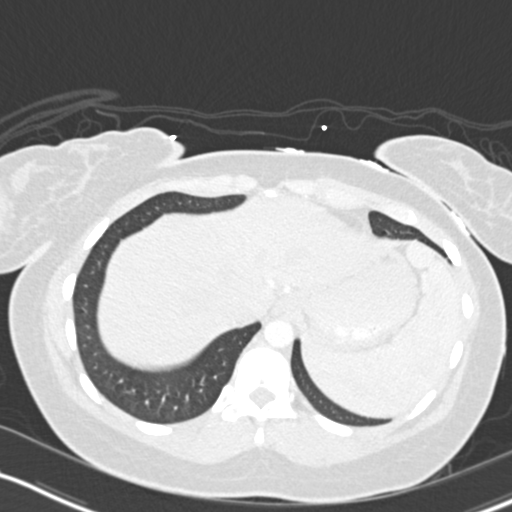
[im 60/237  mediastinal]
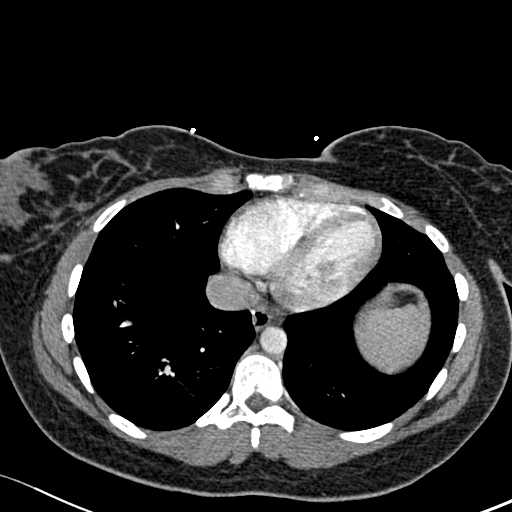
[im 71/237  lung]
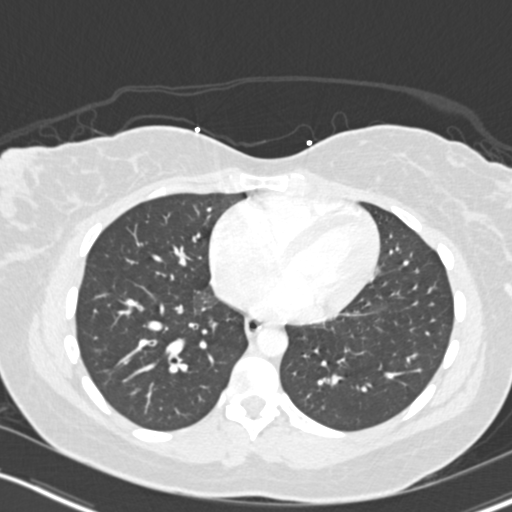
[im 79/237  mediastinal]
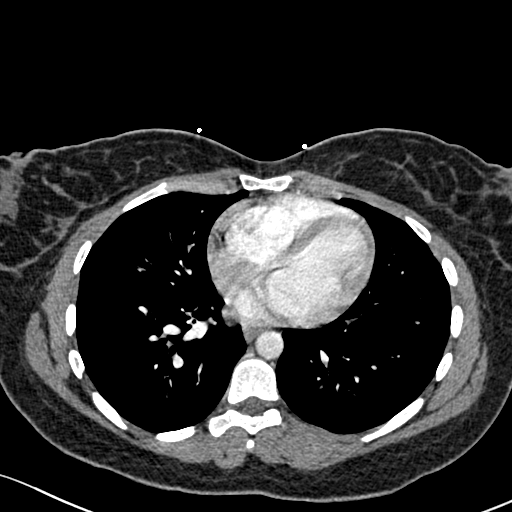
[im 83/237  lung]
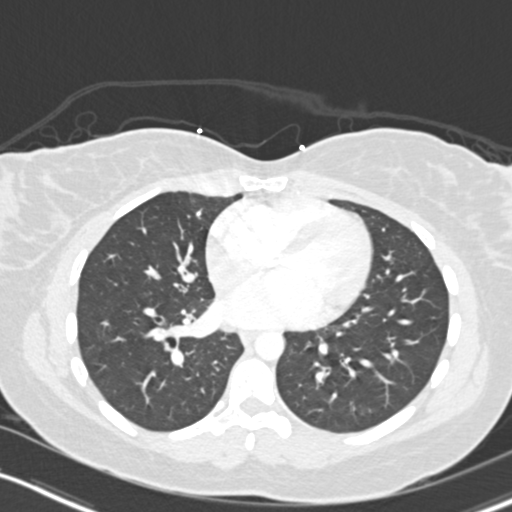
[im 95/237  mediastinal]
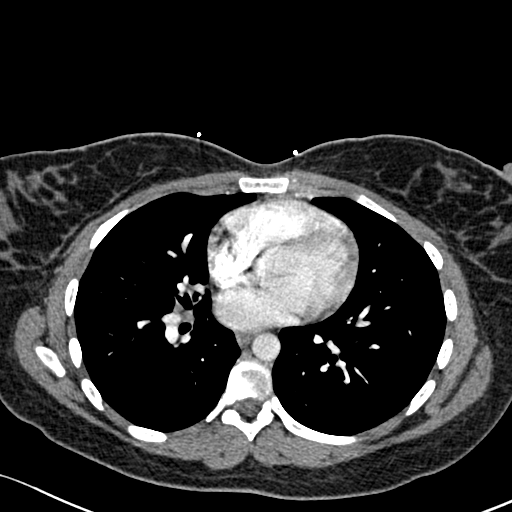
[im 107/237  lung]
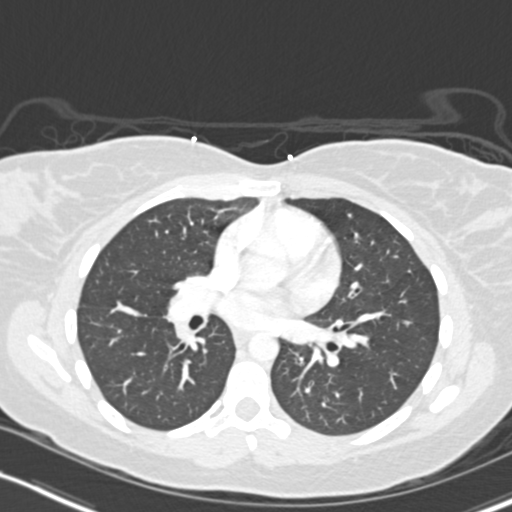
[im 119/237  mediastinal]
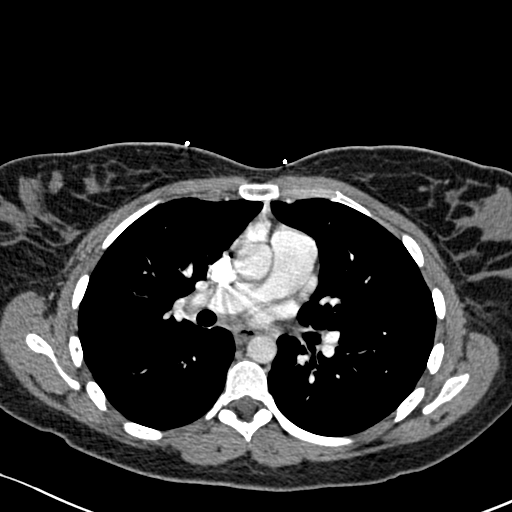
[im 130/237  lung]
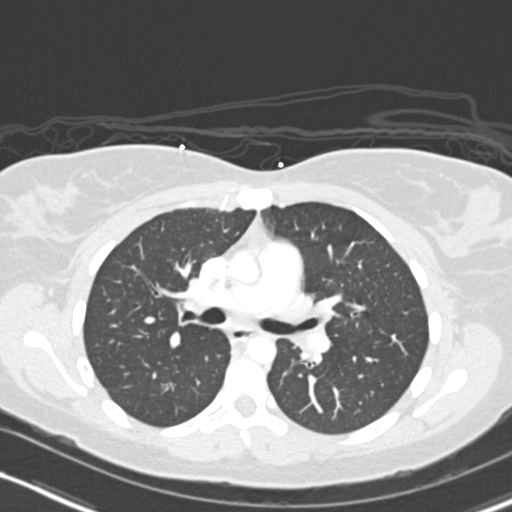
[im 142/237  mediastinal]
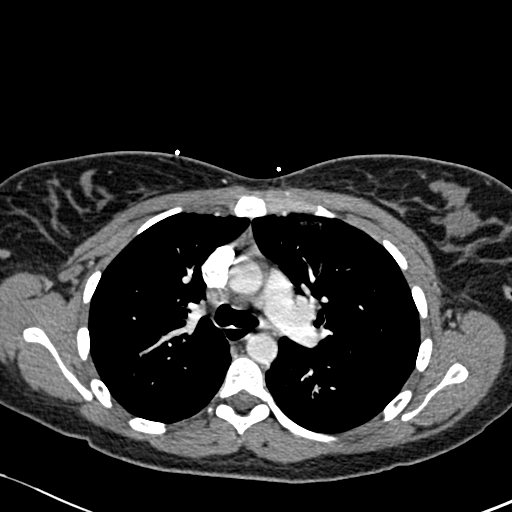
[im 154/237  lung]
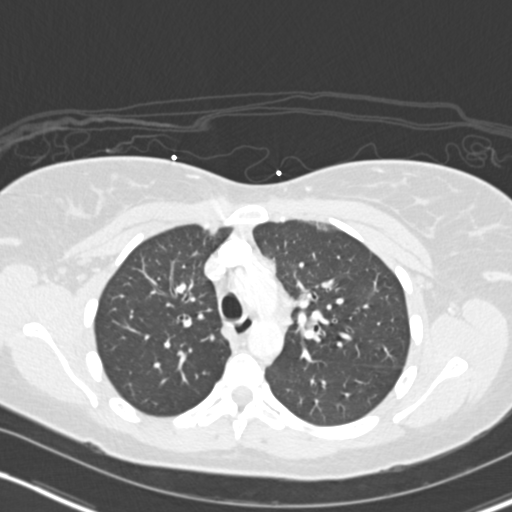
[im 158/237  mediastinal]
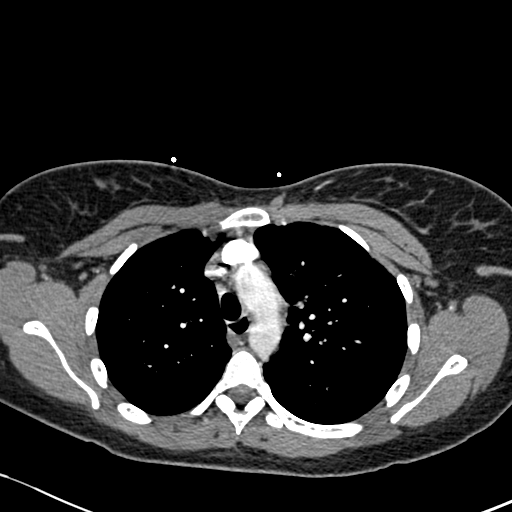
[im 166/237  lung]
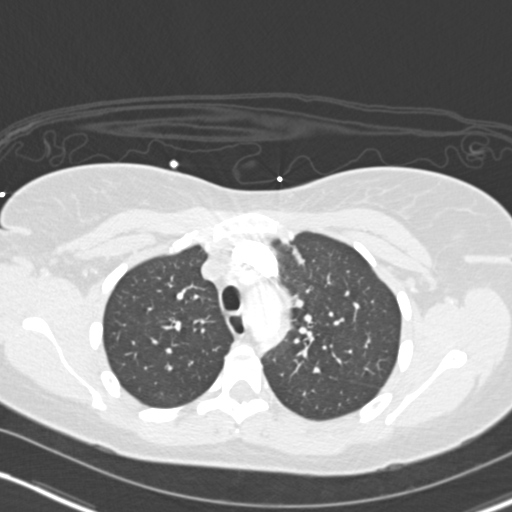
[im 178/237  mediastinal]
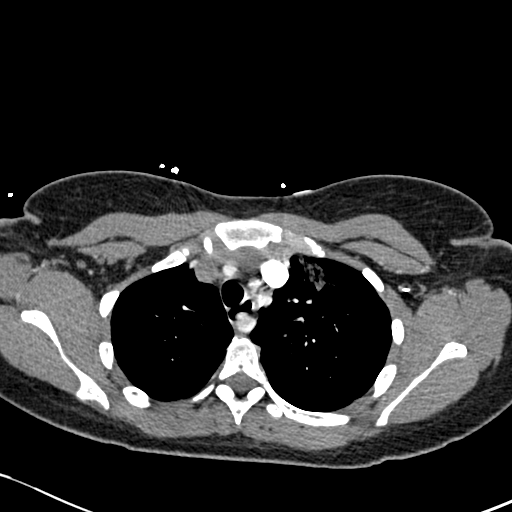
[im 201/237  lung]
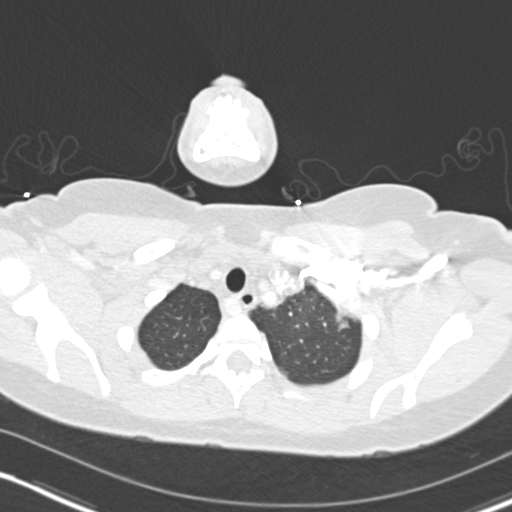
[im 213/237  mediastinal]
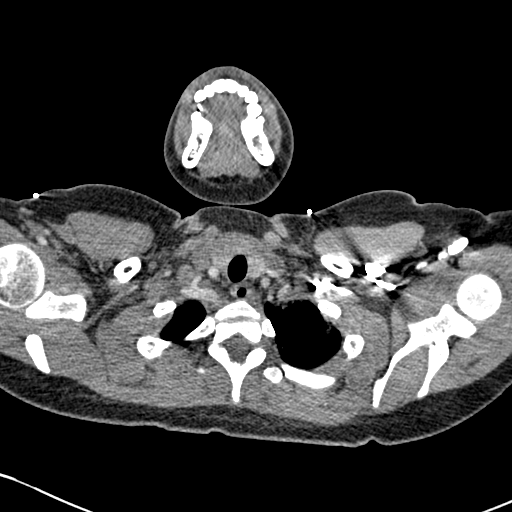
[im 225/237  lung]
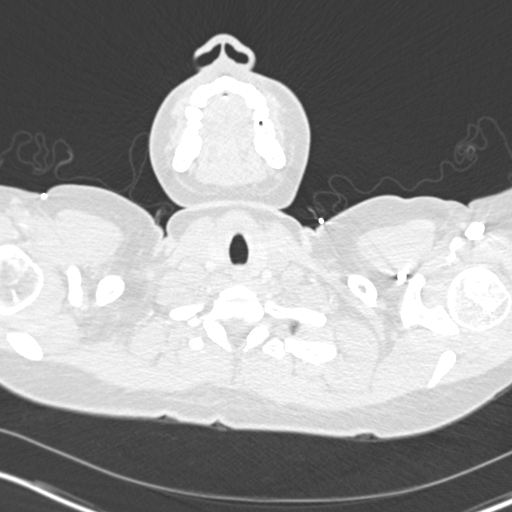

[19 of 32 positions shown; findings below may reference images not displayed]

FINDINGS: Suboptimal contrast bolus timing in the pulmonary arterial tree.
Superimposed mild respiratory motion, worse in the left lung. No
central pulmonary artery filling defect. No hilar pulmonary artery
filling defect. Segmental and subsegmental branches are not as well
evaluated but appear to remain patent.

No pericardial or pleural effusion. Aberrant origin of the right
subclavian artery incidentally noted. Otherwise negative visible
aorta.

Anterior bilateral upper lobe multifocal peripheral ground-glass and
early consolidation, 0 worse in the left lung (series 7, image 20).
Major airways are patent. Mild peribronchial thickening. Minimal
atelectasis.

Small mediastinal and hilar lymph nodes are at the upper limits of
normal or mildly reactive. No axillary lymphadenopathy. Negative
thoracic inlet soft tissues.

Visualized liver, spleen, pancreas, adrenal glands, kidneys, and
bowel in the upper abdomen are within normal limits.

No osseous abnormality identified.

Review of the MIP images confirms the above findings.
IMPRESSION: 1. Suboptimal contrast bolus timing and mild respiratory motion but
no evidence of acute pulmonary embolus.
2. Left greater than right upper lobe distal airway infection /
subpleural bronchopneumonia. Early consolidation on the left. No
pleural effusion.
3. Aberrant right subclavian artery origin, normal anatomic
variation.

## 2016-02-14 IMAGING — CR DG CHEST 2V
2 series · 2 of 2 positions shown · non-contrast
Comparison: CT chest 02/02/2015.  Chest x-ray 02/02/2015

CLINICAL DATA: Pneumonia.  Cough.

EXAM:
CHEST  2 VIEW

[w chest pa]
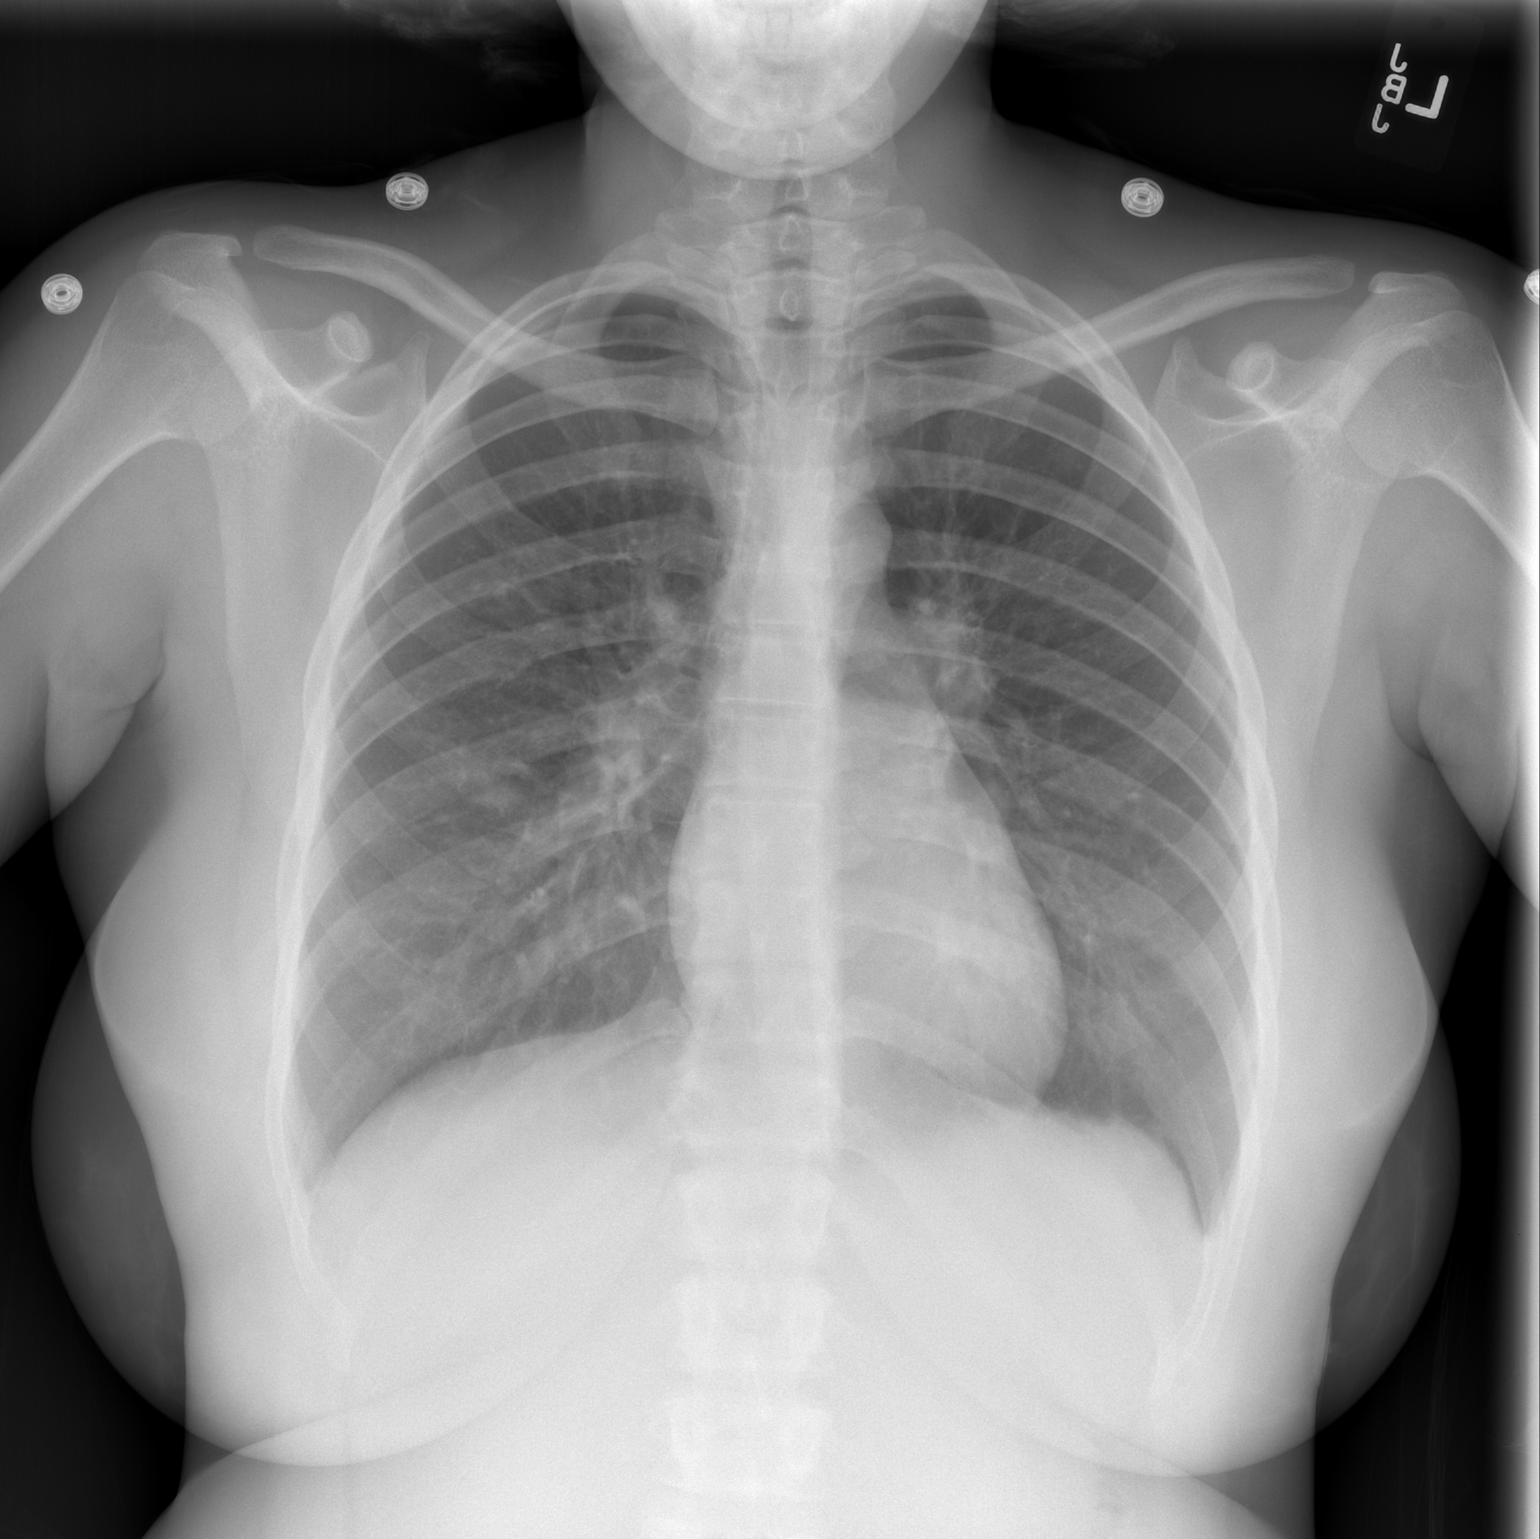

[w chest lat]
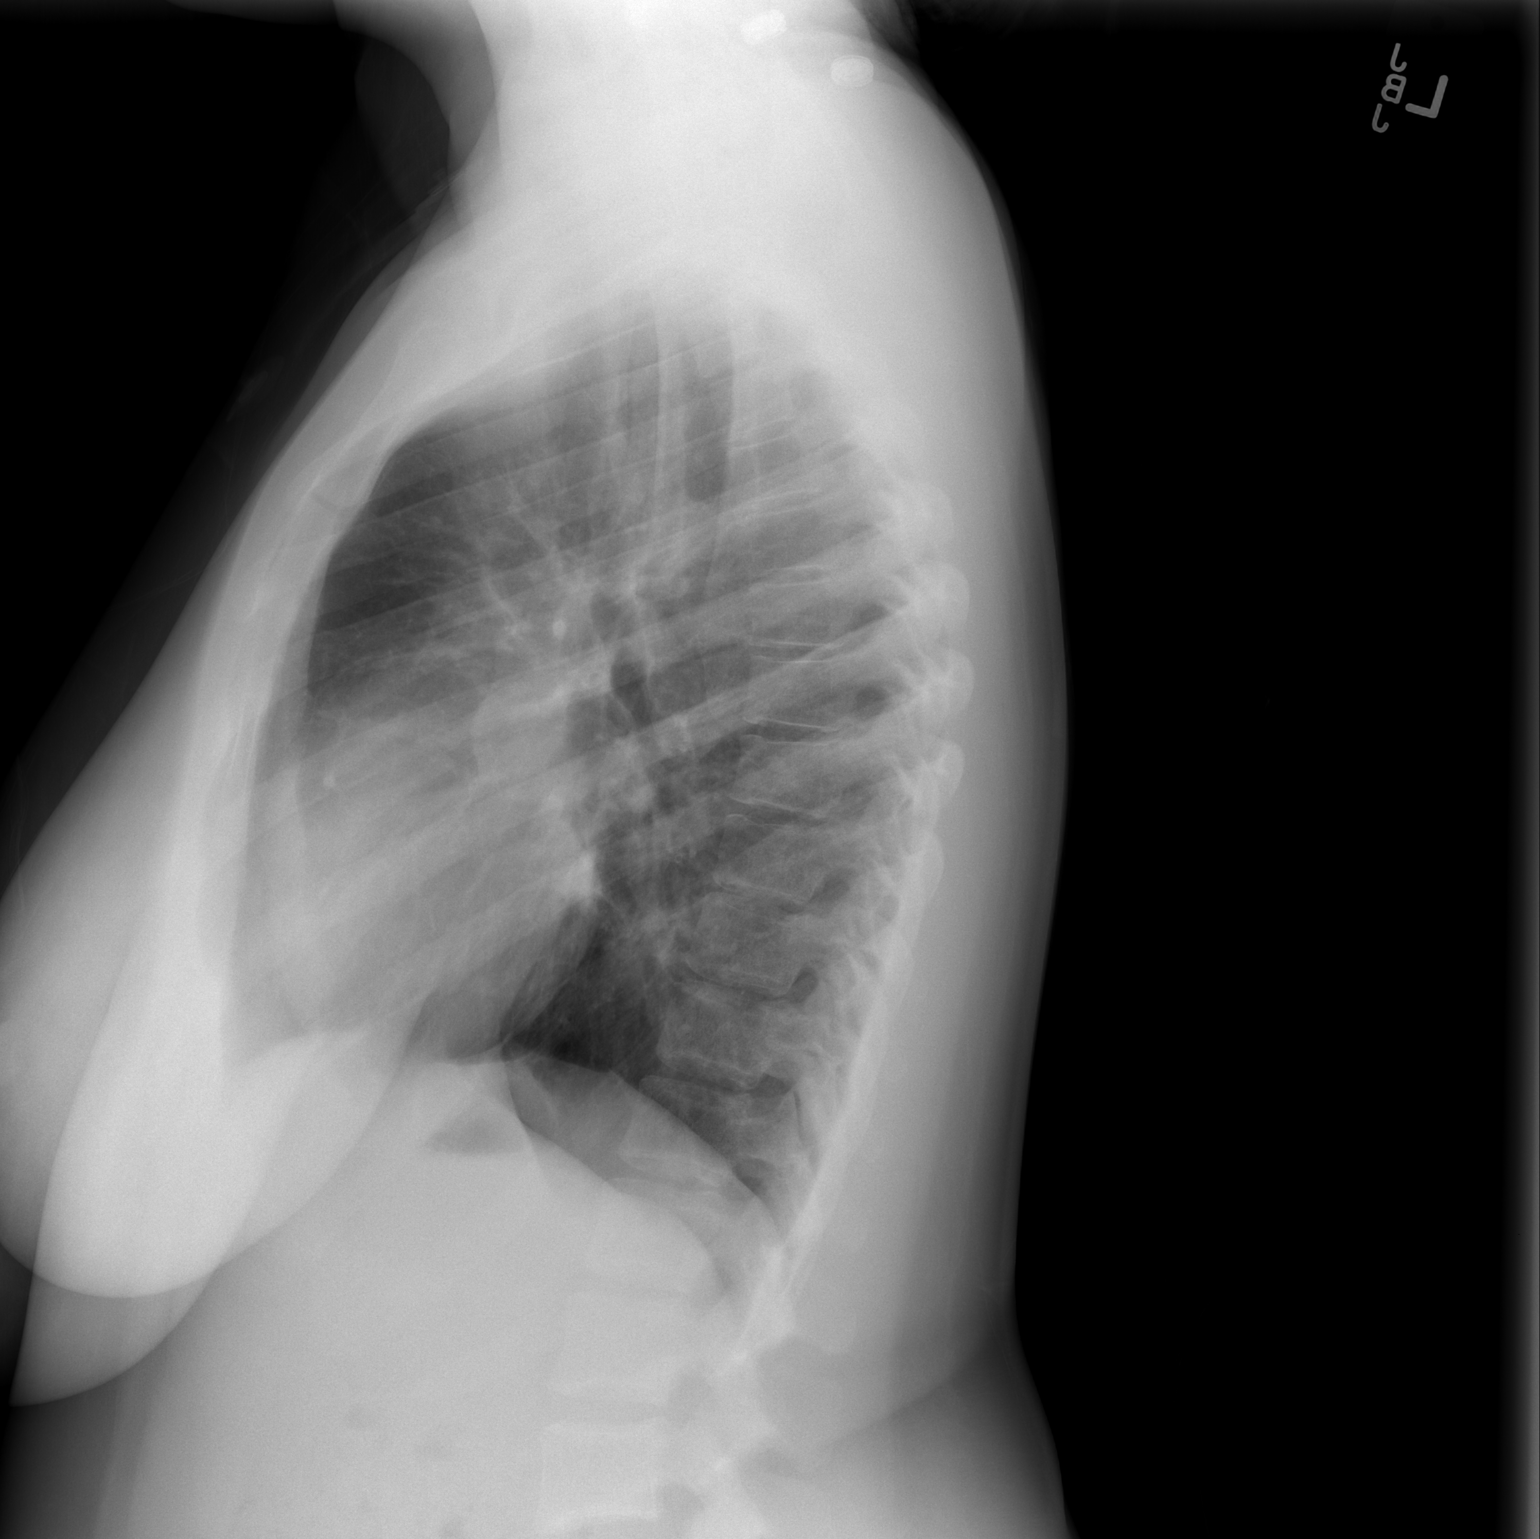

[2 of 2 positions shown; findings below may reference images not displayed]

FINDINGS: Mediastinum and hilar structures are normal. Heart size normal. Mild
new infiltrate in the right mid lung noted. Prior identified
infiltrate in the left upper lung appears to have cleared. No
pleural effusion or pneumothorax. Heart size normal. No acute
osseous abnormality.
IMPRESSION: 1.  New infiltrate right mid lung consistent with pneumonia.

2. Previously identified left upper lobe infiltrate appears to have
cleared.

## 2016-06-20 ENCOUNTER — Ambulatory Visit (HOSPITAL_COMMUNITY)
Admission: EM | Admit: 2016-06-20 | Discharge: 2016-06-20 | Disposition: A | Discharge status: Home / Self Care | Payer: Self-pay | Attending: Family Medicine | Admitting: Family Medicine

## 2016-06-20 ENCOUNTER — Encounter (HOSPITAL_COMMUNITY): Payer: Self-pay | Admitting: *Deleted

## 2016-06-20 DIAGNOSIS — Z3201 Encounter for pregnancy test, result positive: Secondary | ICD-10-CM

## 2016-06-20 DIAGNOSIS — Z331 Pregnant state, incidental: Secondary | ICD-10-CM

## 2016-06-20 DIAGNOSIS — R112 Nausea with vomiting, unspecified: Secondary | ICD-10-CM

## 2016-06-20 LAB — POCT URINALYSIS DIP (DEVICE)
Bilirubin Urine: NEGATIVE
GLUCOSE, UA: NEGATIVE mg/dL
Hgb urine dipstick: NEGATIVE
KETONES UR: NEGATIVE mg/dL
Nitrite: NEGATIVE
PH: 7 (ref 5.0–8.0)
PROTEIN: NEGATIVE mg/dL
SPECIFIC GRAVITY, URINE: 1.02 (ref 1.005–1.030)
UROBILINOGEN UA: 1 mg/dL (ref 0.0–1.0)

## 2016-06-20 LAB — POCT PREGNANCY, URINE: PREG TEST UR: POSITIVE — AB

## 2016-06-20 MED ORDER — DOXYLAMINE-PYRIDOXINE 10-10 MG PO TBEC: 1.0000 | DELAYED_RELEASE_TABLET | Freq: Two times a day (BID) | ORAL | 2 refills | Status: DC

## 2016-06-20 NOTE — ED Provider Notes (Signed)
[facility]    CSN: [id number] Arrival date & time: 06/20/16  1227  First Provider Contact:  First MD Initiated Contact with Patient 06/20/16 1304        History   Chief Complaint Chief Complaint  Patient presents with  . Nausea    HPI Amanda Murillo is a 25 y.o. female.   The history is provided by the patient.  Emesis  Severity:  Mild Duration:  2 weeks Progression:  Unchanged Chronicity:  New Ineffective treatments:  None tried Associated symptoms: no fever   Associated symptoms comment:  Right breast d/c. lmp 7/30, no birth control  Risk factors: pregnant     Past Medical History:  Diagnosis Date  . Asthma   . Bronchitis   . Trichomonas infection   . 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 Gastritis and gastroduodenitis 02/08/2015  . CAP (community acquired pneumonia) 02/02/2015  . Asthma exacerbation 02/02/2015  . Sepsis (HCC) 02/02/2015  . Hypokalemia 02/02/2015  . Anemia 02/02/2015  . Acute respiratory failure with hypoxia (HCC) 02/02/2015  . Prolonged QT interval 02/02/2015  . SVD (spontaneous vaginal delivery) 12/14/2011  . Previous stillbirth or demise, antepartum 12/06/2011    Past Surgical History:  Procedure Laterality Date  . NO PAST SURGERIES      OB History    Gravida Para Term Preterm AB Living   3 3 3  0 0 2   SAB TAB Ectopic Multiple Live Births   0 0 0 0 1       Home Medications    Prior to Admission medications   Medication Sig Start Date End Date Taking? Authorizing Provider  albuterol (PROVENTIL HFA;VENTOLIN HFA) 108 (90 BASE) MCG/ACT inhaler Inhale 1-2 puffs into the lungs every 6 (six) hours as needed for wheezing or shortness of breath. 02/08/15   Jaclyn ShaggyEnobong Amao, MD  albuterol (PROVENTIL) (2.5 MG/3ML) 0.083% nebulizer solution Take 3 mLs (2.5 mg total) by nebulization every 6 (six) hours as needed for wheezing or shortness of breath.  02/08/15   Jaclyn ShaggyEnobong Amao, MD  beclomethasone (QVAR) 40 MCG/ACT inhaler Inhale 2 puffs into the lungs 2 (two) times daily. 02/08/15   Jaclyn ShaggyEnobong Amao, MD  levofloxacin (LEVAQUIN) 750 MG tablet Take 1 tablet (750 mg total) by mouth daily. 02/08/15   Jaclyn ShaggyEnobong Amao, MD  omeprazole (PRILOSEC) 40 MG capsule Take 1 capsule (40 mg total) by mouth daily. 02/08/15   Jaclyn ShaggyEnobong Amao, MD  predniSONE (DELTASONE) 5 MG tablet Take 10 tablets (50 mg total) by mouth daily with breakfast. Taper down prednisone starting from 50 mg a day, taper down by 5 mg a day down to 0 mg. for ex, today 50 mg, tomorrow 45 mg, then 40 mg the following day and etc... This is your new medication. Please take as indicated above. 02/04/15   Amanda MurrayAlma M Devine, MD    Family History Family History  Problem Relation Age of Onset  . Stroke Mother   . Anesthesia problems Neg Hx     Social History Social History  Substance Use Topics  . Smoking status: Never Smoker  . Smokeless tobacco: Never Used  . Alcohol use No     Allergies   Review of patient's allergies indicates no known allergies.   Review of Systems Review of Systems  Constitutional: Negative for fever.  Gastrointestinal: Positive for nausea and vomiting.  All other systems reviewed and are negative.  Physical Exam Triage Vital Signs ED Triage Vitals [06/20/16 1243]  Enc Vitals Group     BP (!) 91/53     Pulse Rate 67     Resp 16     Temp 98.6 F (37 C)     Temp Source Oral     SpO2 100 %     Weight      Height      Head Circumference      Peak Flow      Pain Score      Pain Loc      Pain Edu?      Excl. in GC?    No data found.   Updated Vital Signs BP (!) 91/53 (BP Location: Right Arm) Comment: notified rn  Pulse 67   Temp 98.6 F (37 C) (Oral)   Resp 16   LMP 06/04/2016   SpO2 100%   Visual Acuity Right Eye Distance:   Left Eye Distance:   Bilateral Distance:    Right Eye Near:   Left Eye Near:    Bilateral Near:     Physical Exam    Constitutional: She is oriented to person, place, and time. She appears well-developed and well-nourished. She appears distressed.  Cardiovascular: Normal rate and regular rhythm.   Pulmonary/Chest: Effort normal and breath sounds normal.  Clear fluid expressed from right nipple. No masses or tenderness.  Neurological: She is alert and oriented to person, place, and time.  Nursing note and vitals reviewed.    UC Treatments / Results  Labs (all labs ordered are listed, but only abnormal results are displayed) Labs Reviewed  POCT PREGNANCY, URINE - Abnormal; Notable for the following:       Result Value   Preg Test, Ur POSITIVE (*)    All other components within normal limits  POCT URINALYSIS DIP (DEVICE) - Abnormal; Notable for the following:    Leukocytes, UA SMALL (*)    All other components within normal limits    EKG  EKG Interpretation None       Radiology No results found.  Procedures Procedures (including critical care time)  Medications Ordered in UC Medications - No data to display   Initial Impression / Assessment and Plan / UC Course  I have reviewed the triage vital signs and the nursing notes.  Pertinent labs & imaging results that were available during my care of the patient were reviewed by me and considered in my medical decision making (see chart for details).  Clinical Course      Final Clinical Impressions(s) / UC Diagnoses   Final diagnoses:  None    New Prescriptions New Prescriptions   No medications on file     Linna HoffJames D Amory Zbikowski, MD 06/20/16 1321

## 2016-06-20 NOTE — ED Triage Notes (Signed)
Pt  Reports  Symptoms  Of  Nausea   Dizzyness      As   Well  As   Discharge  From  r  Nipple

## 2016-06-20 NOTE — Discharge Instructions (Signed)
See your ob doctor for prenatal care. °

## 2016-06-23 ENCOUNTER — Inpatient Hospital Stay (HOSPITAL_COMMUNITY)
Admission: AD | Admit: 2016-06-23 | Discharge: 2016-06-23 | Disposition: A | Discharge status: Home / Self Care | Payer: Self-pay | Source: Ambulatory Visit | Attending: Obstetrics and Gynecology | Admitting: Obstetrics and Gynecology

## 2016-06-23 ENCOUNTER — Encounter (HOSPITAL_COMMUNITY): Payer: Self-pay | Admitting: *Deleted

## 2016-06-23 DIAGNOSIS — Z3490 Encounter for supervision of normal pregnancy, unspecified, unspecified trimester: Secondary | ICD-10-CM

## 2016-06-23 DIAGNOSIS — R110 Nausea: Secondary | ICD-10-CM | POA: Insufficient documentation

## 2016-06-23 DIAGNOSIS — O26891 Other specified pregnancy related conditions, first trimester: Secondary | ICD-10-CM | POA: Insufficient documentation

## 2016-06-23 DIAGNOSIS — Z3A01 Less than 8 weeks gestation of pregnancy: Secondary | ICD-10-CM | POA: Insufficient documentation

## 2016-06-23 DIAGNOSIS — O219 Vomiting of pregnancy, unspecified: Secondary | ICD-10-CM

## 2016-06-23 MED ORDER — PROMETHAZINE HCL 25 MG PO TABS: 25.0000 mg | ORAL_TABLET | Freq: Four times a day (QID) | ORAL | 2 refills | Status: DC | PRN

## 2016-06-23 NOTE — MAU Provider Note (Addendum)
Faculty Practice OB/GYN Attending Note  Subjective:  25 y.o. [id number] at 487w4d here to  "see how far along I am". Was seen in ED for nausea on 06/20/16 and diagnosed with pregnancy. She wants an ultrasound to confirm how far along she is as she is contemplating termination. Reports nausea, was unable to fill Diclegis Rx given to her due to cost. No bleeding, no abdominal pain or other symptoms.    Objective:  Blood pressure (!) 108/54, pulse 73, temperature 98.2 F (36.8 C), resp. rate 18, last menstrual period 05/15/2016. Gen: NAD HENT: Normocephalic, atraumatic Lungs: Normal respiratory effort Heart: Regular rate noted Abdomen: NT, soft Cervix: Deferred Ext: No edema, no cyanosis  Assessment & Plan:  Patient has early pregnancy, contemplating termination.  Was assured that she will get an ultrasound at any facility she goes to for termination.  Phenergan prescribed as needed for nausea.Was told to return for any pain, bleeding or other concerns.     Jaynie CollinsUGONNA  Adolph Clutter, MD, FACOG Attending Obstetrician & Gynecologist Faculty Practice, [facility] - 

## 2016-06-23 NOTE — MAU Note (Signed)
Pt presents to MAU stating that she wants to know how far pregnant she is because she wants to terminate the pregnancy. Denies any pain or bleeding

## 2016-08-04 ENCOUNTER — Inpatient Hospital Stay (HOSPITAL_COMMUNITY)
Admission: AD | Admit: 2016-08-04 | Discharge: 2016-08-04 | Disposition: A | Discharge status: Home / Self Care | Payer: Self-pay | Source: Ambulatory Visit | Attending: Obstetrics and Gynecology | Admitting: Obstetrics and Gynecology

## 2016-08-04 ENCOUNTER — Encounter (HOSPITAL_COMMUNITY): Payer: Self-pay

## 2016-08-04 ENCOUNTER — Inpatient Hospital Stay (HOSPITAL_COMMUNITY): Payer: Self-pay

## 2016-08-04 DIAGNOSIS — A599 Trichomoniasis, unspecified: Secondary | ICD-10-CM

## 2016-08-04 DIAGNOSIS — O98311 Other infections with a predominantly sexual mode of transmission complicating pregnancy, first trimester: Secondary | ICD-10-CM | POA: Insufficient documentation

## 2016-08-04 DIAGNOSIS — R109 Unspecified abdominal pain: Secondary | ICD-10-CM

## 2016-08-04 DIAGNOSIS — Z87898 Personal history of other specified conditions: Secondary | ICD-10-CM

## 2016-08-04 DIAGNOSIS — Z3491 Encounter for supervision of normal pregnancy, unspecified, first trimester: Secondary | ICD-10-CM

## 2016-08-04 DIAGNOSIS — Z3A11 11 weeks gestation of pregnancy: Secondary | ICD-10-CM | POA: Insufficient documentation

## 2016-08-04 DIAGNOSIS — Z8679 Personal history of other diseases of the circulatory system: Secondary | ICD-10-CM

## 2016-08-04 DIAGNOSIS — A5901 Trichomonal vulvovaginitis: Secondary | ICD-10-CM | POA: Insufficient documentation

## 2016-08-04 DIAGNOSIS — R102 Pelvic and perineal pain: Secondary | ICD-10-CM | POA: Insufficient documentation

## 2016-08-04 DIAGNOSIS — O26899 Other specified pregnancy related conditions, unspecified trimester: Secondary | ICD-10-CM

## 2016-08-04 LAB — URINE MICROSCOPIC-ADD ON

## 2016-08-04 LAB — URINALYSIS, ROUTINE W REFLEX MICROSCOPIC
BILIRUBIN URINE: NEGATIVE
Glucose, UA: NEGATIVE mg/dL
KETONES UR: 15 mg/dL — AB
NITRITE: NEGATIVE
Protein, ur: NEGATIVE mg/dL
Specific Gravity, Urine: >1.03 — ABNORMAL HIGH (ref 1.005–1.030)
pH: 5.5 (ref 5.0–8.0)

## 2016-08-04 LAB — WET PREP, GENITAL
CLUE CELLS WET PREP: NONE SEEN
SPERM: NONE SEEN
YEAST WET PREP: NONE SEEN

## 2016-08-04 LAB — HCG, QUANTITATIVE, PREGNANCY: HCG, BETA CHAIN, QUANT, S: 68936 m[IU]/mL — AB (ref <5)

## 2016-08-04 MED ORDER — METRONIDAZOLE 500 MG PO TABS
2000.0000 mg | ORAL_TABLET | Freq: Once | ORAL | Status: AC
  Administered 2016-08-04: 2000 mg via ORAL
  Filled 2016-08-04: qty 4

## 2016-08-04 NOTE — MAU Provider Note (Signed)
History     CSN: [id number]  Arrival date and time: 08/04/16 1211   None     Chief Complaint  Patient presents with  . Vaginal Discharge  . Abdominal Pain   G4P3003 @[redacted]w[redacted]d  by LMP c/o intermittent sharp lower abdominal pain. She denies VB. She reports yellow mucous vaginal discharge x2 weeks, no odor. No new partners. No fevers. She is undecided if she will continue pregnancy as she is considering termination.    OB History    Gravida Para Term Preterm AB Living   4 3 3  0 0 3   SAB TAB Ectopic Multiple Live Births   0 0 0 0 3      Past Medical History:  Diagnosis Date  . Asthma   . Bronchitis   . Trichomonas infection   . 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 NO PAST SURGERIES      Family History  Problem Relation Age of Onset  . Stroke Mother   . Anesthesia problems Neg Hx     Social History  Substance Use Topics  . Smoking status: Never Smoker  . Smokeless tobacco: Never Used  . Alcohol use No    Allergies: No Known Allergies  Prescriptions Prior to Admission  Medication Sig Dispense Refill Last Dose  . albuterol (PROVENTIL HFA;VENTOLIN HFA) 108 (90 BASE) MCG/ACT inhaler Inhale 1-2 puffs into the lungs every 6 (six) hours as needed for wheezing or shortness of breath. 1 Inhaler 0 Past Month at Unknown time  . albuterol (PROVENTIL) (2.5 MG/3ML) 0.083% nebulizer solution Take 3 mLs (2.5 mg total) by nebulization every 6 (six) hours as needed for wheezing or shortness of breath. 75 mL 12 Past Month at Unknown time  . beclomethasone (QVAR) 40 MCG/ACT inhaler Inhale 2 puffs into the lungs 2 (two) times daily. 1 Inhaler 2 Past Month at Unknown time  . promethazine (PHENERGAN) 25 MG tablet Take 1 tablet (25 mg total) by mouth every 6 (six) hours as needed for nausea or vomiting. (Patient not taking: Reported on 08/04/2016) 36 tablet 2 Not Taking at Unknown time    Review of Systems   Constitutional: Positive for chills.  Gastrointestinal: Positive for abdominal pain and vomiting.  Genitourinary: Negative.    Physical Exam   Blood pressure 105/62, pulse 75, temperature 98.7 F (37.1 C), resp. rate 16, last menstrual period 05/15/2016.  Physical Exam  Constitutional: She is oriented to person, place, and time. She appears well-developed and well-nourished.  HENT:  Head: Normocephalic and atraumatic.  Neck: Normal range of motion.  Cardiovascular: Normal rate.   Respiratory: Effort normal.  GI: Soft. She exhibits no distension and no mass. There is no tenderness. There is no rebound and no guarding.  Genitourinary:  Genitourinary Comments: External: no lesions Vagina: rugated, parous, moderate thin yellow discharge Uterus: non enlarged, anteverted, non tender, no CMT Adnexae: no masses, no tenderness left, no tenderness right   Musculoskeletal: Normal range of motion.  Neurological: She is alert and oriented to person, place, and time.  Skin: Skin is warm and dry.  Psychiatric: She has a normal mood and affect.   Results for orders placed or performed during the hospital encounter of 08/04/16 (from the past 24 hour(s))  Urinalysis, Routine w reflex microscopic (not at [facility])     Status: Abnormal   Collection Time: 08/04/16 12:20 PM  Result Value Ref Range   Color, Urine YELLOW  YELLOW   APPearance CLEAR CLEAR   Specific Gravity, Urine >1.030 (H) 1.005 - 1.030   pH 5.5 5.0 - 8.0   Glucose, UA NEGATIVE NEGATIVE mg/dL   Hgb urine dipstick TRACE (A) NEGATIVE   Bilirubin Urine NEGATIVE NEGATIVE   Ketones, ur 15 (A) NEGATIVE mg/dL   Protein, ur NEGATIVE NEGATIVE mg/dL   Nitrite NEGATIVE NEGATIVE   Leukocytes, UA MODERATE (A) NEGATIVE  Urine microscopic-add on     Status: Abnormal   Collection Time: 08/04/16 12:20 PM  Result Value Ref Range   Squamous Epithelial / LPF 0-5 (A) NONE SEEN   WBC, UA 6-30 0 - 5 WBC/hpf   RBC / HPF 0-5 0 - 5 RBC/hpf   Bacteria,  UA MANY (A) NONE SEEN   Crystals CA OXALATE CRYSTALS (A) NEGATIVE   Trichomonas, UA PRESENT    Urine-Other MUCOUS PRESENT   Wet prep, genital     Status: Abnormal   Collection Time: 08/04/16  1:30 PM  Result Value Ref Range   Yeast Wet Prep HPF POC NONE SEEN NONE SEEN   Trich, Wet Prep PRESENT (A) NONE SEEN   Clue Cells Wet Prep HPF POC NONE SEEN NONE SEEN   WBC, Wet Prep HPF POC MANY (A) NONE SEEN   Sperm NONE SEEN   hCG, quantitative, pregnancy     Status: Abnormal   Collection Time: 08/04/16  2:43 PM  Result Value Ref Range   hCG, Beta Chain, Quant, S 68,936 (H) <5 mIU/mL   [place] Ob Comp Less 14 Wks  Result Date: 08/04/2016 CLINICAL DATA:  First-trimester pregnancy. Right lower quadrant abdominal pain. EXAM: OBSTETRIC <14 WK ULTRASOUND TECHNIQUE: Transabdominal ultrasound was performed for evaluation of the gestation as well as the maternal uterus and adnexal regions. COMPARISON:  None. FINDINGS: Intrauterine gestational sac: A single intrauterine pregnancy is present. Yolk sac:  Present Embryo:  Present Cardiac Activity: Present Heart Rate: 146 bpm CRL:   5.78  mm   12 w 2 d                  US EDC: 02/14/2017 Subchorionic hemorrhage:  None visualized. Maternal uterus/adnexae: The uterus is otherwise unremarkable. A corpus luteal cyst is present on the left measuring up to 2 cm. No acute or focal lesion is evident to explain right lower quadrant pain. IMPRESSION: 1. Single live intrauterine pregnancy measuring at 5.78 mm in crown-rump length. The estimated gestational age is 12 weeks and 2 days. 2. Normal fetal heart rate of 146 beats per minute. 3. Left adnexal corpus luteal cyst. 4. No acute or focal lesion to explain right lower quadrant pain. Electronically Signed   By: Marin Robertshristopher  Mattern M.D.   On: 08/04/2016 15:42    MAU Course  Procedures Flagyl 2g po x1  MDM Labs and US ordered and reviewed. Normal IUP, dates consistent with LMP. Needs Flagyl for trich but pt has hx of prolonged  QT interval. Spoke with Dr. Alysia PennaErvin and Sue LushAndrea in pharmacy, will obtain EKG to determine safety for administration. EKG interpretation by Dr. Rennis GoldenHilty with Cardiology-nml EKG, [facility]. Pt notified partner needs treatment for trich also, can be done at [facility]. She says she is not planning to continue relationship with him. Stable for discharge home.  Assessment and Plan   1. Cramping affecting pregnancy, antepartum   2. Pelvic pain affecting pregnancy   3. First trimester pregnancy   4. Normal intrauterine pregnancy on prenatal ultrasound,  first trimester   5. Trichimoniasis   6. History of prolonged Q-T interval on ECG    Discharge home Follow up with OB provider of choice for prenatal care if decides to continue pregnancy Follow up for outpt EKG at [facility] in 3 days Return for worsening sx  Donette Larry, CNM 08/04/2016, 1:24 PM

## 2016-08-04 NOTE — Progress Notes (Addendum)
Patient unsure of LMP thinks sometime in July, but then a couple of weeks ago had spotting, unable to obtain FHR by doppler, patient is undecided about whether she is continuing with the pregnancy or not. Does not want her children who are presents to know that she is pregnant.

## 2016-08-04 NOTE — Progress Notes (Signed)
Called regarding interpretation of EKG. Patient had prolonged QTc last year in the setting of pneumonia. EKG today shows normal sinus rhythm with a normal QTc interval of 411 msec. There was a warning issued by pharmacy about an order by the MAU provider who wishes to order metronidazole. The treatment plan is for a single 2G dose. It is unlikely for this to significantly increase QTc with a single dose. Conservatively, could have the patient obtain a repeat EKG in a few days - possibly at [facility] (since she doesn't have a PCP or OB/GYN and I'm told that [facility]U would not see her back for a follow-up EKG). Consider [facility] as an alternative. Feel free to page cardiology on call for assistance in interpreting the QTc.  Chrystie NoseKenneth C. Kasheena Sambrano, MD, [facility] CenterFACC Attending Cardiologist [facility]

## 2016-08-04 NOTE — MAU Note (Signed)
Pt presents to MAU with complaints of an increase in vaginal discharge and lower abdominal pain. Pt reports small amount of spotting two weeks ago but denies any at this time

## 2016-08-04 NOTE — Discharge Instructions (Signed)
Trichomoniasis Trichomoniasis is an infection caused by an organism called Trichomonas. The infection can affect both women and men. In women, the outer female genitalia and the vagina are affected. In men, the penis is mainly affected, but the prostate and other reproductive organs can also be involved. Trichomoniasis is a sexually transmitted infection (STI) and is most often passed to another person through sexual contact.  RISK FACTORS  Having unprotected sexual intercourse.  Having sexual intercourse with an infected partner. SIGNS AND SYMPTOMS  Symptoms of trichomoniasis in women include:  Abnormal gray-green frothy vaginal discharge.  Itching and irritation of the vagina.  Itching and irritation of the area outside the vagina. Symptoms of trichomoniasis in men include:   Penile discharge with or without pain.  Pain during urination. This results from inflammation of the urethra. DIAGNOSIS  Trichomoniasis may be found during a Pap test or physical exam. Your health care provider may use one of the following methods to help diagnose this infection:  Testing the pH of the vagina with a test tape.  Using a vaginal swab test that checks for the Trichomonas organism. A test is available that provides results within a few minutes.  Examining a urine sample.  Testing vaginal secretions. Your health care provider may test you for other STIs, including HIV. TREATMENT   You may be given medicine to fight the infection. Women should inform their health care provider if they could be or are pregnant. Some medicines used to treat the infection should not be taken during pregnancy.  Your health care provider may recommend over-the-counter medicines or creams to decrease itching or irritation.  Your sexual partner will need to be treated if infected.  Your health care provider may test you for infection again 3 months after treatment. HOME CARE INSTRUCTIONS   Take medicines only as  directed by your health care provider.  Take over-the-counter medicine for itching or irritation as directed by your health care provider.  Do not have sexual intercourse while you have the infection.  Women should not douche or wear tampons while they have the infection.  Discuss your infection with your partner. Your partner may have gotten the infection from you, or you may have gotten it from your partner.  Have your sex partner get examined and treated if necessary.  Practice safe, informed, and protected sex.  See your health care provider for other STI testing. SEEK MEDICAL CARE IF:   You still have symptoms after you finish your medicine.  You develop abdominal pain.  You have pain when you urinate.  You have bleeding after sexual intercourse.  You develop a rash.  Your medicine makes you sick or makes you throw up (vomit). MAKE SURE YOU:  Understand these instructions.  Will watch your condition.  Will get help right away if you are not doing well or get worse.   This information is not intended to replace advice given to you by your health care provider. Make sure you discuss any questions you have with your health care provider.   Document Released: 04/18/2001 Document Revised: 11/13/2014 Document Reviewed: 08/04/2013 Elsevier Interactive Patient Education 2016 [employer] First Trimester of Pregnancy The first trimester of pregnancy is from week 1 until the end of week 12 (months 1 through 3). A week after a sperm fertilizes an egg, the egg will implant on the wall of the uterus. This embryo will begin to develop into a baby. Genes from you and your partner are forming the baby.   The female genes determine whether the baby is a boy or a girl. At 6-8 weeks, the eyes and face are formed, and the heartbeat can be seen on ultrasound. At the end of 12 weeks, all the baby's organs are formed.  Now that you are pregnant, you will want to do everything you can to have a  healthy baby. Two of the most important things are to get good prenatal care and to follow your health care provider's instructions. Prenatal care is all the medical care you receive before the baby's birth. This care will help prevent, find, and treat any problems during the pregnancy and childbirth. BODY CHANGES Your body goes through many changes during pregnancy. The changes vary from woman to woman.   You may gain or lose a couple of pounds at first.  You may feel sick to your stomach (nauseous) and throw up (vomit). If the vomiting is uncontrollable, call your health care provider.  You may tire easily.  You may develop headaches that can be relieved by medicines approved by your health care provider.  You may urinate more often. Painful urination may mean you have a bladder infection.  You may develop heartburn as a result of your pregnancy.  You may develop constipation because certain hormones are causing the muscles that push waste through your intestines to slow down.  You may develop hemorrhoids or swollen, bulging veins (varicose veins).  Your breasts may begin to grow larger and become tender. Your nipples may stick out more, and the tissue that surrounds them (areola) may become darker.  Your gums may bleed and may be sensitive to brushing and flossing.  Dark spots or blotches (chloasma, mask of pregnancy) may develop on your face. This will likely fade after the baby is born.  Your menstrual periods will stop.  You may have a loss of appetite.  You may develop cravings for certain kinds of food.  You may have changes in your emotions from day to day, such as being excited to be pregnant or being concerned that something may go wrong with the pregnancy and baby.  You may have more vivid and strange dreams.  You may have changes in your hair. These can include thickening of your hair, rapid growth, and changes in texture. Some women also have hair loss during or  after pregnancy, or hair that feels dry or thin. Your hair will most likely return to normal after your baby is born. WHAT TO EXPECT AT YOUR PRENATAL VISITS During a routine prenatal visit:  You will be weighed to make sure you and the baby are growing normally.  Your blood pressure will be taken.  Your abdomen will be measured to track your baby's growth.  The fetal heartbeat will be listened to starting around week 10 or 12 of your pregnancy.  Test results from any previous visits will be discussed. Your health care provider may ask you:  How you are feeling.  If you are feeling the baby move.  If you have had any abnormal symptoms, such as leaking fluid, bleeding, severe headaches, or abdominal cramping.  If you are using any tobacco products, including cigarettes, chewing tobacco, and electronic cigarettes.  If you have any questions. Other tests that may be performed during your first trimester include:  Blood tests to find your blood type and to check for the presence of any previous infections. They will also be used to check for low iron levels (anemia) and Rh antibodies. Later in   the pregnancy, blood tests for diabetes will be done along with other tests if problems develop.  Urine tests to check for infections, diabetes, or protein in the urine.  An ultrasound to confirm the proper growth and development of the baby.  An amniocentesis to check for possible genetic problems.  Fetal screens for spina bifida and Down syndrome.  You may need other tests to make sure you and the baby are doing well.  HIV (human immunodeficiency virus) testing. Routine prenatal testing includes screening for HIV, unless you choose not to have this test. HOME CARE INSTRUCTIONS  Medicines  Follow your health care provider's instructions regarding medicine use. Specific medicines may be either safe or unsafe to take during pregnancy.  Take your prenatal vitamins as directed.  If you  develop constipation, try taking a stool softener if your health care provider approves. Diet  Eat regular, well-balanced meals. Choose a variety of foods, such as meat or vegetable-based protein, fish, milk and low-fat dairy products, vegetables, fruits, and whole grain breads and cereals. Your health care provider will help you determine the amount of weight gain that is right for you.  Avoid raw meat and uncooked cheese. These carry germs that can cause birth defects in the baby.  Eating four or five small meals rather than three large meals a day may help relieve nausea and vomiting. If you start to feel nauseous, eating a few soda crackers can be helpful. Drinking liquids between meals instead of during meals also seems to help nausea and vomiting.  If you develop constipation, eat more high-fiber foods, such as fresh vegetables or fruit and whole grains. Drink enough fluids to keep your urine clear or pale yellow. Activity and Exercise  Exercise only as directed by your health care provider. Exercising will help you:  Control your weight.  Stay in shape.  Be prepared for labor and delivery.  Experiencing pain or cramping in the lower abdomen or low back is a good sign that you should stop exercising. Check with your health care provider before continuing normal exercises.  Try to avoid standing for long periods of time. Move your legs often if you must stand in one place for a long time.  Avoid heavy lifting.  Wear low-heeled shoes, and practice good posture.  You may continue to have sex unless your health care provider directs you otherwise. Relief of Pain or Discomfort  Wear a good support bra for breast tenderness.   Take warm sitz baths to soothe any pain or discomfort caused by hemorrhoids. Use hemorrhoid cream if your health care provider approves.   Rest with your legs elevated if you have leg cramps or low back pain.  If you develop varicose veins in your legs,  wear support hose. Elevate your feet for 15 minutes, 3-4 times a day. Limit salt in your diet. Prenatal Care  Schedule your prenatal visits by the twelfth week of pregnancy. They are usually scheduled monthly at first, then more often in the last 2 months before delivery.  Write down your questions. Take them to your prenatal visits.  Keep all your prenatal visits as directed by your health care provider. Safety  Wear your seat belt at all times when driving.  Make a list of emergency phone numbers, including numbers for family, friends, the hospital, and police and fire departments. General Tips  Ask your health care provider for a referral to a local prenatal education class. Begin classes no later than at the beginning   of month 6 of your pregnancy.  Ask for help if you have counseling or nutritional needs during pregnancy. Your health care provider can offer advice or refer you to specialists for help with various needs.  Do not use hot tubs, steam rooms, or saunas.  Do not douche or use tampons or scented sanitary pads.  Do not cross your legs for long periods of time.  Avoid cat litter boxes and soil used by cats. These carry germs that can cause birth defects in the baby and possibly loss of the fetus by miscarriage or stillbirth.  Avoid all smoking, herbs, alcohol, and medicines not prescribed by your health care provider. Chemicals in these affect the formation and growth of the baby.  Do not use any tobacco products, including cigarettes, chewing tobacco, and electronic cigarettes. If you need help quitting, ask your health care provider. You may receive counseling support and other resources to help you quit.  Schedule a dentist appointment. At home, brush your teeth with a soft toothbrush and be gentle when you floss. SEEK MEDICAL CARE IF:   You have dizziness.  You have mild pelvic cramps, pelvic pressure, or nagging pain in the abdominal area.  You have persistent  nausea, vomiting, or diarrhea.  You have a bad smelling vaginal discharge.  You have pain with urination.  You notice increased swelling in your face, hands, legs, or ankles. SEEK IMMEDIATE MEDICAL CARE IF:   You have a fever.  You are leaking fluid from your vagina.  You have spotting or bleeding from your vagina.  You have severe abdominal cramping or pain.  You have rapid weight gain or loss.  You vomit blood or material that looks like coffee grounds.  You are exposed to German measles and have never had them.  You are exposed to fifth disease or chickenpox.  You develop a severe headache.  You have shortness of breath.  You have any kind of trauma, such as from a fall or a car accident.   This information is not intended to replace advice given to you by your health care provider. Make sure you discuss any questions you have with your health care provider.   Document Released: 10/17/2001 Document Revised: 11/13/2014 Document Reviewed: 09/02/2013 Elsevier Interactive Patient Education 2016 Elsevier Inc.  

## 2016-08-07 LAB — GC/CHLAMYDIA PROBE AMP (~~LOC~~) NOT AT ARMC
Chlamydia: NEGATIVE
Neisseria Gonorrhea: NEGATIVE

## 2016-10-06 LAB — HM PAP SMEAR: HM Pap smear: NORMAL

## 2016-10-07 ENCOUNTER — Inpatient Hospital Stay (HOSPITAL_COMMUNITY)
Admission: AD | Admit: 2016-10-07 | Discharge: 2016-10-07 | Disposition: A | Discharge status: Home / Self Care | Payer: Medicaid Other | Source: Ambulatory Visit | Attending: Obstetrics & Gynecology | Admitting: Obstetrics & Gynecology

## 2016-10-07 ENCOUNTER — Encounter (HOSPITAL_COMMUNITY): Payer: Self-pay

## 2016-10-07 ENCOUNTER — Telehealth: Payer: Self-pay | Admitting: Student

## 2016-10-07 DIAGNOSIS — O26892 Other specified pregnancy related conditions, second trimester: Secondary | ICD-10-CM | POA: Insufficient documentation

## 2016-10-07 DIAGNOSIS — O99512 Diseases of the respiratory system complicating pregnancy, second trimester: Secondary | ICD-10-CM | POA: Diagnosis not present

## 2016-10-07 DIAGNOSIS — J45909 Unspecified asthma, uncomplicated: Secondary | ICD-10-CM | POA: Diagnosis not present

## 2016-10-07 DIAGNOSIS — R921 Mammographic calcification found on diagnostic imaging of breast: Secondary | ICD-10-CM | POA: Diagnosis present

## 2016-10-07 DIAGNOSIS — Z3A20 20 weeks gestation of pregnancy: Secondary | ICD-10-CM | POA: Insufficient documentation

## 2016-10-07 DIAGNOSIS — L209 Atopic dermatitis, unspecified: Secondary | ICD-10-CM

## 2016-10-07 LAB — COMPREHENSIVE METABOLIC PANEL
ALBUMIN: 3.3 g/dL — AB (ref 3.5–5.0)
ALT: 12 U/L — ABNORMAL LOW (ref 14–54)
AST: 16 U/L (ref 15–41)
Alkaline Phosphatase: 49 U/L (ref 38–126)
Anion gap: 5 (ref 5–15)
BUN: 7 mg/dL (ref 6–20)
CALCIUM: 9 mg/dL (ref 8.9–10.3)
CHLORIDE: 106 mmol/L (ref 101–111)
CO2: 25 mmol/L (ref 22–32)
CREATININE: 0.37 mg/dL — AB (ref 0.44–1.00)
GFR calc Af Amer: >60 mL/min (ref >60)
GFR calc non Af Amer: >60 mL/min (ref >60)
Glucose, Bld: 91 mg/dL (ref 65–99)
Potassium: 3.6 mmol/L (ref 3.5–5.1)
SODIUM: 136 mmol/L (ref 135–145)
Total Bilirubin: 0.3 mg/dL (ref 0.3–1.2)
Total Protein: 6.7 g/dL (ref 6.5–8.1)

## 2016-10-07 MED ORDER — DIPHENHYDRAMINE HCL 25 MG PO CAPS
25.0000 mg | ORAL_CAPSULE | Freq: Once | ORAL | Status: AC
  Administered 2016-10-07: 25 mg via ORAL
  Filled 2016-10-07: qty 1

## 2016-10-07 MED ORDER — HYDROXYZINE PAMOATE 50 MG PO CAPS: 50.0000 mg | ORAL_CAPSULE | Freq: Three times a day (TID) | ORAL | 0 refills | Status: DC | PRN

## 2016-10-07 MED ORDER — TRIAMCINOLONE ACETONIDE 0.5 % EX OINT: 1.0000 "application " | TOPICAL_OINTMENT | Freq: Two times a day (BID) | CUTANEOUS | 0 refills | Status: DC

## 2016-10-07 NOTE — MAU Provider Note (Addendum)
History   Patient Amanda Murillo is a [id number] at 20 weeks and 3 days by LMP here with complaints of a rash for the past two weeks. She has a an appointment with the health department on December 20th.   CSN: [id number]  Arrival date and time: 10/07/16 1414   None     Chief Complaint  Patient presents with  . Rash   Rash  This is a new problem. The current episode started 1 to 4 weeks ago. The problem has been gradually worsening since onset. The affected locations include the abdomen, left shoulder and neck. The rash is characterized by dryness and itchiness. She was exposed to nothing. Pertinent negatives include no anorexia, congestion, cough, diarrhea, eye pain, facial edema, fatigue, fever, joint pain, nail changes, rhinorrhea, shortness of breath, sore throat or vomiting. Past treatments include anti-itch cream and moisturizer. The treatment provided no relief.    OB History    Gravida Para Term Preterm AB Living   5 3 3  0 0 3   SAB TAB Ectopic Multiple Live Births   0 0 0 0 3      Past Medical History:  Diagnosis Date  . Asthma   . Bronchitis   . Trichomonas infection   . 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 NO PAST SURGERIES    . THERAPEUTIC ABORTION      Family History  Problem Relation Age of Onset  . Stroke Mother   . Anesthesia problems Neg Hx     Social History  Substance Use Topics  . Smoking status: Never Smoker  . Smokeless tobacco: Never Used  . Alcohol use No    Allergies: No Known Allergies  Prescriptions Prior to Admission  Medication Sig Dispense Refill Last Dose  . albuterol (PROVENTIL HFA;VENTOLIN HFA) 108 (90 BASE) MCG/ACT inhaler Inhale 1-2 puffs into the lungs every 6 (six) hours as needed for wheezing or shortness of breath. 1 Inhaler 0 Past Month at Unknown time  . albuterol (PROVENTIL) (2.5 MG/3ML) 0.083% nebulizer solution Take 3 mLs (2.5 mg total) by  nebulization every 6 (six) hours as needed for wheezing or shortness of breath. 75 mL 12 Past Month at Unknown time  . beclomethasone (QVAR) 40 MCG/ACT inhaler Inhale 2 puffs into the lungs 2 (two) times daily. 1 Inhaler 2 Past Month at Unknown time    Review of Systems  Constitutional: Negative for fatigue and fever.  HENT: Negative for congestion, rhinorrhea and sore throat.   Eyes: Negative for pain.  Respiratory: Negative for cough and shortness of breath.   Gastrointestinal: Negative for anorexia, diarrhea and vomiting.  Genitourinary: Negative.   Musculoskeletal: Negative for joint pain.  Skin: Positive for rash. Negative for nail changes.  Neurological: Negative.   Endo/Heme/Allergies: Negative.    Physical Exam   Blood pressure 109/61, pulse 89, temperature 98.6 F (37 C), resp. rate 18, height 4\' 11"  (1.499 m), weight 75.3 kg (166 lb), last menstrual period 05/15/2016.  Physical Exam  Constitutional: She is oriented to person, place, and time. She appears well-developed.  Neck: Normal range of motion.  Respiratory: Effort normal. No respiratory distress. She has no wheezes.  GI: She exhibits no distension and no mass. There is no tenderness. There is no rebound and no guarding.  Musculoskeletal: Normal range of motion.  Neurological: She is alert and oriented to person, place, and time.  Skin: Rash noted.  MAU Course  Procedures  MDM -Bile salts -CMP -Benadryl for itching  Assessment and Plan  Patient feels better after Benadryl. CMP WNL, Bile salts are pending. Patient to be discharged with prescription for Vistaril and Kenalog and instructions to keep her appointment at the clinic on Dec 20.   Charlesetta GaribaldiKathryn Lorraine Kooistra 10/07/2016, 3:49 PM   Attestation of Attending Supervision of Advanced Practitioner (CNM/NP): Evaluation and management procedures were performed by the Advanced Practitioner under my supervision and collaboration. I have reviewed the Advanced  Practitioner's note and chart, and I agree with the management and plan.  Lesly DukesLEGGETT,Petrea Fredenburg H., MD 5:53 PM

## 2016-10-07 NOTE — Discharge Instructions (Signed)
Take Benadryl and Vistaril as needed for itching. Keep clinic appointment on December 20. as needed for itching, and Vistaril 3 times daily for iching Contact Dermatitis Introduction Dermatitis is redness, soreness, and swelling (inflammation) of the skin. Contact dermatitis is a reaction to certain substances that touch the skin. You either touched something that irritated your skin, or you have allergies to something you touched. Follow these instructions at home: Skin Care  Moisturize your skin as needed.  Apply cool compresses to the affected areas.  Try taking a bath with:  Epsom salts. Follow the instructions on the package. You can get these at a pharmacy or grocery store.  Baking soda. Pour a small amount into the bath as told by your doctor.  Colloidal oatmeal. Follow the instructions on the package. You can get this at a pharmacy or grocery store.  Try applying baking soda paste to your skin. Stir water into baking soda until it looks like paste.  Do not scratch your skin.  Bathe less often.  Bathe in lukewarm water. Avoid using hot water. Medicines  Take or apply over-the-counter and prescription medicines only as told by your doctor.  If you were prescribed an antibiotic medicine, take or apply your antibiotic as told by your doctor. Do not stop taking the antibiotic even if your condition starts to get better. General instructions  Keep all follow-up visits as told by your doctor. This is important.  Avoid the substance that caused your reaction. If you do not know what caused it, keep a journal to try to track what caused it. Write down:  What you eat.  What cosmetic products you use.  What you drink.  What you wear in the affected area. This includes jewelry.  If you were given a bandage (dressing), take care of it as told by your doctor. This includes when to change and remove it. Contact a doctor if:  You do not get better with treatment.  Your  condition gets worse.  You have signs of infection such as:  Swelling.  Tenderness.  Redness.  Soreness.  Warmth.  You have a fever.  You have new symptoms. Get help right away if:  You have a very bad headache.  You have neck pain.  Your neck is stiff.  You throw up (vomit).  You feel very sleepy.  You see red streaks coming from the affected area.  Your bone or joint underneath the affected area becomes painful after the skin has healed.  The affected area turns darker.  You have trouble breathing. This information is not intended to replace advice given to you by your health care provider. Make sure you discuss any questions you have with your health care provider. Document Released: 08/20/2009 Document Revised: 03/30/2016 Document Reviewed: 03/10/2015  2017 Elsevier

## 2016-10-07 NOTE — MAU Note (Signed)
Patient presents with rash on abdomen, arms and folds of skin, itching, has been using Cortisone has not helped.

## 2016-10-09 LAB — BILE ACIDS, TOTAL: BILE ACIDS TOTAL: 9.4 umol/L (ref 4.7–24.5)

## 2016-11-01 LAB — HM PAP SMEAR: HM Pap smear: NORMAL

## 2016-11-01 LAB — OB RESULTS CONSOLE ABO/RH: RH TYPE: POSITIVE

## 2016-11-01 LAB — OB RESULTS CONSOLE HEPATITIS B SURFACE ANTIGEN: Hepatitis B Surface Ag: NEGATIVE

## 2016-11-01 LAB — OB RESULTS CONSOLE GC/CHLAMYDIA
Chlamydia: NEGATIVE
Gonorrhea: NEGATIVE

## 2016-11-01 LAB — OB RESULTS CONSOLE RPR: RPR: NONREACTIVE

## 2016-11-01 LAB — OB RESULTS CONSOLE ANTIBODY SCREEN: Antibody Screen: NEGATIVE

## 2016-11-01 LAB — OB RESULTS CONSOLE RUBELLA ANTIBODY, IGM: RUBELLA: IMMUNE

## 2016-11-05 ENCOUNTER — Encounter (HOSPITAL_COMMUNITY): Payer: Self-pay

## 2016-11-05 ENCOUNTER — Inpatient Hospital Stay (HOSPITAL_COMMUNITY)
Admission: AD | Admit: 2016-11-05 | Discharge: 2016-11-05 | Disposition: A | Discharge status: Home / Self Care | Payer: Medicaid Other | Source: Ambulatory Visit | Attending: Obstetrics and Gynecology | Admitting: Obstetrics and Gynecology

## 2016-11-05 DIAGNOSIS — R1030 Lower abdominal pain, unspecified: Secondary | ICD-10-CM | POA: Diagnosis present

## 2016-11-05 DIAGNOSIS — O26892 Other specified pregnancy related conditions, second trimester: Secondary | ICD-10-CM | POA: Diagnosis not present

## 2016-11-05 DIAGNOSIS — R109 Unspecified abdominal pain: Secondary | ICD-10-CM

## 2016-11-05 DIAGNOSIS — Z3A24 24 weeks gestation of pregnancy: Secondary | ICD-10-CM | POA: Insufficient documentation

## 2016-11-05 LAB — URINALYSIS, ROUTINE W REFLEX MICROSCOPIC
BILIRUBIN URINE: NEGATIVE
GLUCOSE, UA: NEGATIVE mg/dL
Hgb urine dipstick: NEGATIVE
Ketones, ur: NEGATIVE mg/dL
LEUKOCYTES UA: NEGATIVE
NITRITE: NEGATIVE
PH: 7 (ref 5.0–8.0)
Protein, ur: 30 mg/dL — AB
SPECIFIC GRAVITY, URINE: 1.021 (ref 1.005–1.030)

## 2016-11-05 NOTE — Discharge Instructions (Signed)
Abdominal Pain During Pregnancy °Belly (abdominal) pain is common during pregnancy. Most of the time, it is not a serious problem. Other times, it can be a sign that something is wrong with the pregnancy. Always tell your doctor if you have belly pain. °Follow these instructions at home: °Monitor your belly pain for any changes. The following actions may help you feel better: °· Do not have sex (intercourse) or put anything in your vagina until you feel better. °· Rest until your pain stops. °· Drink clear fluids if you feel sick to your stomach (nauseous). Do not eat solid food until you feel better. °· Only take medicine as told by your doctor. °· Keep all doctor visits as told. °Get help right away if: °· You are bleeding, leaking fluid, or pieces of tissue come out of your vagina. °· You have more pain or cramping. °· You keep throwing up (vomiting). °· You have pain when you pee (urinate) or have blood in your pee. °· You have a fever. °· You do not feel your baby moving as much. °· You feel very weak or feel like passing out. °· You have trouble breathing, with or without belly pain. °· You have a very bad headache and belly pain. °· You have fluid leaking from your vagina and belly pain. °· You keep having watery poop (diarrhea). °· Your belly pain does not go away after resting, or the pain gets worse. °This information is not intended to replace advice given to you by your health care provider. Make sure you discuss any questions you have with your health care provider. °Document Released: 10/11/2009 Document Revised: 05/31/2016 Document Reviewed: 05/22/2013 °Elsevier Interactive Patient Education © 2017 Elsevier Inc. ° °

## 2016-11-05 NOTE — MAU Provider Note (Signed)
History   [id number] @ 24.6 wks in with c/o low abd cramping that started tonight. Denies ROM or vag bleeding.  CSN: [id number]  Arrival date & time 11/05/16  1949   First Provider Initiated Contact with Patient 11/05/16 2105      No chief complaint on file.   HPI  Past Medical History:  Diagnosis Date  . Asthma   . Bronchitis   . Trichomonas infection   . 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 NO PAST SURGERIES    . THERAPEUTIC ABORTION      Family History  Problem Relation Age of Onset  . Stroke Mother   . Anesthesia problems Neg Hx     Social History  Substance Use Topics  . Smoking status: Never Smoker  . Smokeless tobacco: Never Used  . Alcohol use No    OB History    Gravida Para Term Preterm AB Living   5 3 3  0 0 3   SAB TAB Ectopic Multiple Live Births   0 0 0 0 3      Review of Systems  Constitutional: Negative.   HENT: Negative.   Eyes: Negative.   Respiratory: Negative.   Cardiovascular: Negative.   Gastrointestinal: Positive for abdominal pain.  Endocrine: Negative.   Genitourinary: Negative.   Musculoskeletal: Negative.   Skin: Negative.   Allergic/Immunologic: Negative.   Neurological: Negative.   Hematological: Negative.   Psychiatric/Behavioral: Negative.     Allergies  Patient has no known allergies.  Home Medications    BP 100/57 (BP Location: Right Arm)   Pulse 86   Temp 98.8 F (37.1 C) (Oral)   Resp 20   Ht 4\' 11"  (1.499 m)   Wt 168 lb (76.2 kg)   LMP 05/15/2016   SpO2 100%   BMI 33.93 kg/m   Physical Exam  Constitutional: She is oriented to person, place, and time. She appears well-developed and well-nourished.  HENT:  Head: Normocephalic.  Neck: Normal range of motion.  Cardiovascular: Normal rate, regular rhythm, normal heart sounds and intact distal pulses.   Pulmonary/Chest: Effort normal and breath sounds normal.  Abdominal:  Soft. Bowel sounds are normal.  Genitourinary: Vagina normal and uterus normal.  Musculoskeletal: Normal range of motion.  Neurological: She is alert and oriented to person, place, and time. She has normal reflexes.  Skin: Skin is warm and dry.  Psychiatric: She has a normal mood and affect. Her behavior is normal. Judgment and thought content normal.    MAU Course  Procedures (including critical care time)  Labs Reviewed  URINALYSIS, ROUTINE W REFLEX MICROSCOPIC - Abnormal; Notable for the following:       Result Value   APPearance HAZY (*)    Protein, ur 30 (*)    Bacteria, UA RARE (*)    Squamous Epithelial / LPF 0-5 (*)    All other components within normal limits   No results found.   1. Abdominal pain in pregnancy, second trimester       MDM  FHR pattern reassring, no uc;s, VSS, SVE firm/cl/post.high. Will d/c home not in labor

## 2016-11-05 NOTE — MAU Note (Signed)
Pt reports pain and pressure for the last 40 minutes. Denies bleeding.

## 2016-11-06 NOTE — L&D Delivery Note (Signed)
Delivery Note Amanda Murillo presented at 37.0 for IOL for cholestasis.  She received cytotec > foley bulb > pitocin for induction.  She progressed to complete at 0900.  At 9:26 AM a viable female was delivered via Vaginal, Spontaneous Delivery (Presentation: LOP).  APGAR: 8, 9; weight pending.   Placenta status: spontaneous, intact. Active management of 3rd stage of labor. Cord: 3VC with the following complications: loose nuchal x1. Cord was clamped x2 and cut after 1 minute.  Anesthesia:  epidural Episiotomy: None Lacerations: None Est. Blood Loss (mL): 200  Mom to postpartum.  Baby to Couplet care / Skin to Skin.  Amanda MerlesJulia Murillo 01/30/2017, 9:40 AM   OB FELLOW DELIVERY ATTESTATION  I was gloved and present for the delivery in its entirety, and I agree with the above resident's note.    Amanda MowElizabeth Mumaw, DO OB Fellow

## 2016-11-16 ENCOUNTER — Inpatient Hospital Stay (HOSPITAL_COMMUNITY)
Admission: AD | Admit: 2016-11-16 | Discharge: 2016-11-16 | Disposition: A | Discharge status: Home / Self Care | Payer: Medicaid Other | Source: Ambulatory Visit | Attending: Obstetrics and Gynecology | Admitting: Obstetrics and Gynecology

## 2016-11-16 ENCOUNTER — Encounter (HOSPITAL_COMMUNITY): Payer: Self-pay | Admitting: *Deleted

## 2016-11-16 DIAGNOSIS — O9989 Other specified diseases and conditions complicating pregnancy, childbirth and the puerperium: Secondary | ICD-10-CM

## 2016-11-16 DIAGNOSIS — N898 Other specified noninflammatory disorders of vagina: Secondary | ICD-10-CM | POA: Insufficient documentation

## 2016-11-16 LAB — URINALYSIS, ROUTINE W REFLEX MICROSCOPIC
Bilirubin Urine: NEGATIVE
GLUCOSE, UA: NEGATIVE mg/dL
HGB URINE DIPSTICK: NEGATIVE
Ketones, ur: NEGATIVE mg/dL
NITRITE: NEGATIVE
PH: 6 (ref 5.0–8.0)
PROTEIN: NEGATIVE mg/dL
SPECIFIC GRAVITY, URINE: 1.013 (ref 1.005–1.030)

## 2016-11-16 LAB — WET PREP, GENITAL
CLUE CELLS WET PREP: NONE SEEN
SPERM: NONE SEEN
Trich, Wet Prep: NONE SEEN
Yeast Wet Prep HPF POC: NONE SEEN

## 2016-11-16 MED ORDER — VALACYCLOVIR HCL 1 G PO TABS: 1000.0000 mg | ORAL_TABLET | Freq: Two times a day (BID) | ORAL | 0 refills | Status: DC

## 2016-11-16 NOTE — Discharge Instructions (Signed)

## 2016-11-16 NOTE — MAU Provider Note (Signed)
  History     CSN: [id number]  Arrival date and time: 11/16/16 0511   None     No chief complaint on file.  HPI Amanda Murillo is a 25yo [id number] @ 26.3wks who presents for one day hx of vag bumps and itching/stabbing sensation/irritation. States she is unable to put her legs together due to the discomfort. Denies abnl vag discharge or bldg. No fever or malaise. Denies ctx or preg concerns.  OB History    Gravida Para Term Preterm AB Living   4 3 3  0 0 2   SAB TAB Ectopic Multiple Live Births   0 0 0 0 2      Past Medical History:  Diagnosis Date  . Asthma   . Bronchitis   . Trichomonas infection   . 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 NO PAST SURGERIES    . THERAPEUTIC ABORTION      Family History  Problem Relation Age of Onset  . Stroke Mother   . Anesthesia problems Neg Hx     Social History  Substance Use Topics  . Smoking status: Never Smoker  . Smokeless tobacco: Never Used  . Alcohol use No    Allergies: No Known Allergies  Prescriptions Prior to Admission  Medication Sig Dispense Refill Last Dose  . hydrOXYzine (VISTARIL) 50 MG capsule Take 1 capsule (50 mg total) by mouth 3 (three) times daily as needed. 30 capsule 0   . triamcinolone ointment (KENALOG) 0.5 % Apply 1 application topically 2 (two) times daily. 30 g 0     Review of Systems Physical Exam   Blood pressure 101/62, pulse 98, temperature 98.7 F (37.1 C), temperature source Oral, resp. rate 16, height 5' (1.524 m), weight 77.1 kg (170 lb), last menstrual period 05/15/2016, SpO2 98 %.  Physical Exam  Constitutional: She is oriented to person, place, and time. She appears well-developed.  HENT:  Head: Normocephalic.  Neck: Normal range of motion.  Cardiovascular: Normal rate.   Respiratory: Effort normal.  GI: Soft.  EFM 120-130s, some 10x10accels, no decels, approp for GA No ctx per toco  Genitourinary:   Genitourinary Comments: R labia majora w/ bump, nonulcerated, very tender, without drainage- cultured Pt reports tenderness on L but unable to visualize a lesion  Musculoskeletal: Normal range of motion.  Neurological: She is alert and oriented to person, place, and time.  Skin: Skin is warm and dry.  Psychiatric: She has a normal mood and affect. Her behavior is normal. Thought content normal.   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

## 2016-11-16 NOTE — MAU Note (Signed)
Pt reports she has vaginal irritation and ? Rash since yesterday.

## 2016-11-17 LAB — GC/CHLAMYDIA PROBE AMP (~~LOC~~) NOT AT ARMC
CHLAMYDIA, DNA PROBE: NEGATIVE
NEISSERIA GONORRHEA: NEGATIVE

## 2016-11-19 LAB — HERPES SIMPLEX VIRUS(HSV) DNA BY PCR
HSV 1 DNA: NEGATIVE
HSV 2 DNA: NEGATIVE

## 2017-01-02 LAB — HM PAP SMEAR: HM PAP: NORMAL

## 2017-01-02 LAB — HIV ANTIBODY (ROUTINE TESTING W REFLEX): HIV 1&2 Ab, 4th Generation: NONREACTIVE

## 2017-01-17 ENCOUNTER — Encounter (HOSPITAL_COMMUNITY): Payer: Self-pay | Admitting: *Deleted

## 2017-01-17 ENCOUNTER — Inpatient Hospital Stay (HOSPITAL_COMMUNITY)
Admission: AD | Admit: 2017-01-17 | Discharge: 2017-01-17 | Disposition: A | Discharge status: Home / Self Care | Payer: Medicaid Other | Source: Ambulatory Visit | Attending: Obstetrics and Gynecology | Admitting: Obstetrics and Gynecology

## 2017-01-17 ENCOUNTER — Inpatient Hospital Stay (HOSPITAL_COMMUNITY): Payer: Medicaid Other

## 2017-01-17 DIAGNOSIS — O26613 Liver and biliary tract disorders in pregnancy, third trimester: Secondary | ICD-10-CM | POA: Insufficient documentation

## 2017-01-17 DIAGNOSIS — K831 Obstruction of bile duct: Secondary | ICD-10-CM | POA: Diagnosis not present

## 2017-01-17 DIAGNOSIS — O23593 Infection of other part of genital tract in pregnancy, third trimester: Secondary | ICD-10-CM | POA: Diagnosis not present

## 2017-01-17 DIAGNOSIS — O99891 Other specified diseases and conditions complicating pregnancy: Secondary | ICD-10-CM

## 2017-01-17 DIAGNOSIS — N7689 Other specified inflammation of vagina and vulva: Secondary | ICD-10-CM

## 2017-01-17 DIAGNOSIS — Z3A35 35 weeks gestation of pregnancy: Secondary | ICD-10-CM | POA: Diagnosis not present

## 2017-01-17 DIAGNOSIS — L299 Pruritus, unspecified: Secondary | ICD-10-CM | POA: Diagnosis present

## 2017-01-17 DIAGNOSIS — M7918 Myalgia, other site: Secondary | ICD-10-CM

## 2017-01-17 DIAGNOSIS — O9989 Other specified diseases and conditions complicating pregnancy, childbirth and the puerperium: Secondary | ICD-10-CM

## 2017-01-17 LAB — CBC
HEMATOCRIT: 28.5 % — AB (ref 36.0–46.0)
HEMOGLOBIN: 9.4 g/dL — AB (ref 12.0–15.0)
MCH: 24.3 pg — ABNORMAL LOW (ref 26.0–34.0)
MCHC: 33 g/dL (ref 30.0–36.0)
MCV: 73.6 fL — AB (ref 78.0–100.0)
Platelets: 200 10*3/uL (ref 150–400)
RBC: 3.87 MIL/uL (ref 3.87–5.11)
RDW: 15.1 % (ref 11.5–15.5)
WBC: 10.3 10*3/uL (ref 4.0–10.5)

## 2017-01-17 LAB — COMPREHENSIVE METABOLIC PANEL
ALT: 11 U/L — AB (ref 14–54)
AST: 16 U/L (ref 15–41)
Albumin: 2.9 g/dL — ABNORMAL LOW (ref 3.5–5.0)
Alkaline Phosphatase: 117 U/L (ref 38–126)
Anion gap: 9 (ref 5–15)
BILIRUBIN TOTAL: 0.7 mg/dL (ref 0.3–1.2)
BUN: 5 mg/dL — AB (ref 6–20)
CO2: 22 mmol/L (ref 22–32)
CREATININE: 0.44 mg/dL (ref 0.44–1.00)
Calcium: 8.4 mg/dL — ABNORMAL LOW (ref 8.9–10.3)
Chloride: 106 mmol/L (ref 101–111)
GFR calc Af Amer: >60 mL/min (ref >60)
GLUCOSE: 101 mg/dL — AB (ref 65–99)
Potassium: 3.2 mmol/L — ABNORMAL LOW (ref 3.5–5.1)
Sodium: 137 mmol/L (ref 135–145)
TOTAL PROTEIN: 7 g/dL (ref 6.5–8.1)

## 2017-01-17 LAB — URINALYSIS, ROUTINE W REFLEX MICROSCOPIC
Bilirubin Urine: NEGATIVE
Glucose, UA: NEGATIVE mg/dL
Hgb urine dipstick: NEGATIVE
Ketones, ur: NEGATIVE mg/dL
LEUKOCYTES UA: NEGATIVE
NITRITE: NEGATIVE
Protein, ur: NEGATIVE mg/dL
SPECIFIC GRAVITY, URINE: 1.002 — AB (ref 1.005–1.030)
pH: 7 (ref 5.0–8.0)

## 2017-01-17 LAB — RAPID URINE DRUG SCREEN, HOSP PERFORMED
Amphetamines: NOT DETECTED
Barbiturates: NOT DETECTED
Benzodiazepines: NOT DETECTED
Cocaine: NOT DETECTED
OPIATES: NOT DETECTED
TETRAHYDROCANNABINOL: NOT DETECTED

## 2017-01-17 LAB — WET PREP, GENITAL
Clue Cells Wet Prep HPF POC: NONE SEEN
Sperm: NONE SEEN
TRICH WET PREP: NONE SEEN
Yeast Wet Prep HPF POC: NONE SEEN

## 2017-01-17 MED ORDER — LIDOCAINE HCL 2 % EX GEL
1.0000 "application " | Freq: Once | CUTANEOUS | Status: AC
  Administered 2017-01-17: 1 via TOPICAL
  Filled 2017-01-17: qty 5

## 2017-01-17 MED ORDER — HYDROXYZINE HCL 50 MG PO TABS
50.0000 mg | ORAL_TABLET | Freq: Once | ORAL | Status: AC
  Administered 2017-01-17: 50 mg via ORAL
  Filled 2017-01-17: qty 1

## 2017-01-17 MED ORDER — URSODIOL 300 MG PO CAPS
300.0000 mg | ORAL_CAPSULE | Freq: Once | ORAL | Status: AC
  Administered 2017-01-17: 300 mg via ORAL
  Filled 2017-01-17: qty 1

## 2017-01-17 MED ORDER — HYDROXYZINE HCL 50 MG PO TABS: 50.0000 mg | ORAL_TABLET | Freq: Once | ORAL | Status: DC

## 2017-01-17 MED ORDER — CEPHALEXIN 500 MG PO CAPS
500.0000 mg | ORAL_CAPSULE | Freq: Four times a day (QID) | ORAL | 0 refills | Status: DC
Start: 2017-01-17 — End: 2017-01-24

## 2017-01-17 MED ORDER — URSODIOL 300 MG PO CAPS: 300.0000 mg | ORAL_CAPSULE | Freq: Three times a day (TID) | ORAL | 0 refills | Status: DC

## 2017-01-17 MED ORDER — HYDROXYZINE PAMOATE 50 MG PO CAPS: 50.0000 mg | ORAL_CAPSULE | Freq: Three times a day (TID) | ORAL | 2 refills | Status: DC | PRN

## 2017-01-17 MED ORDER — LIDOCAINE HCL 2 % EX GEL: 1.0000 "application " | CUTANEOUS | 0 refills | Status: DC | PRN

## 2017-01-17 NOTE — MAU Note (Signed)
Patient c/o generalized itching but most severe in vaginal area. Denies LOF, VB, or contractions at this time. +Fm

## 2017-01-17 NOTE — Discharge Instructions (Signed)
Pubic Symphysis Pain/Dysfunction ° °What is symphysis pubis dysfunction? ° °Symphysis pubis dysfunction (SPD) is a problem with the pelvis. Your pelvis is mainly formed of two pubic bones that curve round to make a cradle shape. The pubic bones meet at the front of your pelvis, at a firm joint called the symphysis pubis.  ° °The joint's connection is made strong by a dense network of tough tissues (ligaments). During pregnancy, swelling and pain can make the symphysis pubis joint less stable, causing SPD. ° °Doctors and physiotherapists classify any type of pelvic pain during pregnancy as pelvic girdle pain (PGP).  ° °SPD is one type of pelvic girdle pain. Diastasis symphysis pubis (DSP) is another type of pelvic girdle pain, which is related to SPD. DSP happens when the gap in the symphysis pubis joint widens too far. DSP is rare, and can only be diagnosed by an X-ray, ultrasound scan or MRI scan. What are the symptoms of SPD? Pain in the pubic area and groin are the most common symptoms, though you may also notice:  ° °Back pain, pain at the back of your pelvis or hip pain.  °Pain, along with a grinding or clicking sensation in your pubic area.  °Pain down the inside of your thighs or between your legs.  °Pain that's made worse by parting your legs, walking, going up or down stairs or moving around in bed.  °Pain that's worse at night and stops you from sleeping well. Getting up to go to the toilet in the middle of the night can be especially painful. ° °SPD can occur at any time during your pregnancy or after giving birth. You may notice it for the first time during the middle of your pregnancy. What causes SPD?During pregnancy, your body produces a hormone called relaxin, which softens your ligaments to help your baby pass through your pelvis. This means that the joints in your pelvis naturally become more lax.  ° °However, this flexibility doesn't necessarily cause the painful problems of SPD. Usually, your  nerves and muscles are able to adapt and compensate for the greater flexibility in your joints. This means your body should cope well with the changes to your posture as your baby grows. ° °SPD is thought to happen when your body doesn't adapt so well to the stretchier, looser ligaments caused by relaxin. SPD can be triggered by: ° °the joints in your pelvis moving unevenly  °changes to the way your muscles work to support your pelvic girdle joints  °one pelvic joint not working properly and causing knock-on pain in the other joints of your pelvis °These problems mean that your pelvis is not as stable as it should be, and this is what causes SPD. Physiotherapy is the best way to treat SPD, because it's about the relationship between your muscles and bones, rather than how lax your joints are. You're more likely to develop SPD if: ° °you had pelvic girdle pain or pelvic joint pain before you became pregnant  °you've had a previous injury to your pelvis  °you've had pelvic girdle pain in a previous pregnancy  °you have a high BMI and were overweight before you became pregnant  °hypermobility in all your joints  °How is SPD diagnosed? Your doctor or midwife should refer you to a women’s health physiotherapist. Your physiotherapist will test the stability, movement and pain in your pelvic joints and muscles. How is SPD treated?SPD is managed in the same way as other pelvic girdle pain. Treatment includes:  ° °  Exercises to strengthen your spinal, tummy, pelvic girdle, hip and pelvic floor muscles. These will improve the stability of your pelvis and back. You may need gentle, hands-on treatment of your hip, back or pelvis to correct stiffness or imbalance. Water gymnastics can sometimes help.  °Your physiotherapist should advise you on how to make daily activities less painful and on how to make the birth of your baby easier. Your midwife should help you to write a birth plan that takes into account your SPD symptoms.    °Acupuncture may help reduce the pain and is safe during pregnancy. Make sure your practitioner is trained and experienced in working with pregnant women.  °Other manual therapies, such as osteopathy may help. See a registered practitioner who is experienced in treating pregnant women.  °A pelvic support belt may give relief, particularly when you're exercising or active. °What can I do to ease the pain of SPD?  °Be as active as you can, but don't push yourself so far that it hurts.  °Stick to the pelvic floor and tummy exercises that your physiotherapist recommends.  °Ask for and accept offers of help with daily chores.  °Plan ahead so that you reduce the activities that cause you problems. You could use a rucksack to carry things around, both indoors and out.  °Take care to part your legs no further than your pain-free range, particularly when getting in and out of the car, bed or bath. If you are lying down, pull up your knees as far as you can to make it easier to part your legs. If you are sitting, try arching your back and sticking your chest out before parting or moving your legs.  °Avoid activities that make your pain worse or that put your pelvis in an uneven position, such as sitting cross-legged or carrying your toddler on your hip. If something hurts, stop doing it. If the pain is allowed to flare up, it can take a long time to settle down again.  °Try to sleep on your side with legs bent and a pillow between your knees.  °Rest regularly or sit down for activities you would normally do standing, such as ironing. By sitting on a birth ball or by getting down on your hands and knees, you'll take the weight of your baby off your pelvis.  °Try not to do heavy lifting or pushing. Pushing supermarket trolleys can often make your pain worse, so shop online or ask someone to shop for you.  °When climbing stairs, take one step at a time. Step up onto one step with your best leg and then bring your other leg to  meet it. Repeat with each step.  °Avoid standing on one leg. When getting dressed, sit down to pull on your knickers or trousers. °Will I recover from SPD after I’ve had my baby ? You’re very likely to recover within a few weeks to a few months after your baby is born. If you can, carry on with physiotherapy after the birth. Try to get help with looking after your baby during the early weeks.  ° °You may find you get twinges every month just before your period is due. This is likely to be caused by hormones that have a similar effect to pregnancy hormones.  ° °If you have SPD in one pregnancy, it is more likely that you’ll have it next time you get pregnant. Ask your midwife to refer you to a physiotherapist early on. SPD may not necessarily be   as bad next time if it is well managed from the start of pregnancy.  ° °You could consider giving yourself a bit of time from one pregnancy to the next. Losing excess weight, getting fit and waiting until your children can walk may help reduce the symptoms of getting any type of pelvic pain next time. Where can I get help and support?[facility] can provide a list of physiotherapists in your area. ° °You can get in touch with other women in your situation by contacting [facility] [employer], a charity that offers support to women with pelvic girdle pain, including SPD, or by visiting our community.  ° °More tips and advice: ° °Go to http://www.[facility].co.uk/a546492/pelvic-pain-spd for more information ° °See our photo guide to pregnancy stretches, designed to help relieve those aches and pains.  °Watch our video for tips on how to get comfortable in bed  °Discover how SPD might affect your labour. ° °Last reviewed: July 2015  °Next review: July 2018 °

## 2017-01-17 NOTE — MAU Provider Note (Signed)
Chief Complaint:  Pruritis and cholestasis   First Provider Initiated Contact with Patient 01/17/17 [id number]      HPI: Amanda Murillo is a 26 y.o. [id number] at 958w2d by LMP, confirmed by 12 week US who presents to maternity admissions reporting severe constant generalized itching x months that is worsening. The itching is everywhere on her body but is worse in the vaginal area, feeling like pins and needles there.  She has tried benadryl, hydrocortisone cream but these have not helped.  [facility] and labwork was done at her last prenatal visit. She had problems with her phone and did not receive phone calls from the office regarding her diagnosis of cholestasis of pregnancy with medications called in to her pharmacy. An appointment was made to [facility] with appt on 01/24/17. She came to MAU to be treated for itching and because [facility]. 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  She also reports hip pain that is intermittent, feels like her hip locks when walking making her almost fall. This occurs most days but not every day 2-3 times.  The pain is severe, over her right hip mostly, and resolves spontaneously. There are no other associated symptoms.  She reports good fetal movement, denies LOF, vaginal bleeding, vaginal itching/burning, urinary symptoms, h/a, dizziness, n/v, or fever/chills.    HPI  Past Medical History: Past Medical History:  Diagnosis Date  . Asthma   . Bronchitis   . Trichomonas infection   . Trisomy 18 in child of prior pregnancy, currently pregnant     Past obstetric history: OB History  Gravida Para Term Preterm AB Living  4 3 3  0 0 2  SAB TAB Ectopic Multiple Live Births  0 0 0 0 2    # Outcome Date GA Lbr Len/2nd Weight Sex Delivery Anes PTL Lv  4 Current           3 Term 12/14/11 6844w1d 15:32 / 00:56 5 lb 14 oz (2.665  kg) F Vag-Spont EPI  LIV  2 Term 08/12/10    M Vag-Spont   LIV  1 Term 02/19/09    Judie PetitM Vag-Spont   FD     Birth Comments: Trisomy 18      Past Surgical History: Past Surgical History:  Procedure Laterality Date  . NO PAST SURGERIES    . THERAPEUTIC ABORTION      Family History: Family History  Problem Relation Age of Onset  . Stroke Mother   . Anesthesia problems Neg Hx     Social History: Social History  Substance Use Topics  . Smoking status: Never Smoker  . Smokeless tobacco: Never Used  . Alcohol use No    Allergies: No Known Allergies  Meds:  No prescriptions prior to admission.    ROS:  Review of Systems  Constitutional: Negative for chills, fatigue and fever.  Eyes: Negative for visual disturbance.  Respiratory: Negative for shortness of breath.   Cardiovascular: Negative for chest pain.  Gastrointestinal: Negative for abdominal pain, nausea and vomiting.  Genitourinary: Positive for pelvic pain and vaginal pain. Negative for difficulty urinating, dysuria, flank pain, vaginal bleeding and vaginal discharge.  Skin:       Pruritis generalized  Neurological: Negative for dizziness and headaches.  Psychiatric/Behavioral: Negative.      I have reviewed patient's  Past Medical Hx, Surgical Hx, Family Hx, Social Hx, medications and allergies.   Physical Exam   Patient Vitals for the past 24 hrs:  BP Temp Temp src Pulse Resp SpO2 Weight  01/17/17 1334 118/56 - - 82 18 100 % -  01/17/17 1006 107/59 97.5 F (36.4 C) Oral 86 18 100 % -  01/17/17 0929 - - - - - - 175 lb 0.6 oz (79.4 kg)   Constitutional: Well-developed, well-nourished female in significant distress.  Cardiovascular: normal rate Respiratory: normal effort GI: Abd soft, non-tender, gravid appropriate for gestational age.  MS: Extremities nontender, no edema, normal ROM Neurologic: Alert and oriented x 4.  GU: Neg CVAT.  PELVIC EXAM: Cultures collected by blind swab Visual inspection reveals  <1cm sized small soft round smooth lesion c/w boil on right labia. No evidence of HSV lesions.      FHT:  Baseline 135 , moderate variability, accelerations present, no decelerations Contractions: None on toco or to palpation  Labs: Results for orders placed or performed during the hospital encounter of 01/17/17 (from the past 24 hour(s))  Urinalysis, Routine w reflex microscopic     Status: Abnormal   Collection Time: 01/17/17  9:04 AM  Result Value Ref Range   Color, Urine STRAW (A) YELLOW   APPearance CLEAR CLEAR   Specific Gravity, Urine 1.002 (L) 1.005 - 1.030   pH 7.0 5.0 - 8.0   Glucose, UA NEGATIVE NEGATIVE mg/dL   Hgb urine dipstick NEGATIVE NEGATIVE   Bilirubin Urine NEGATIVE NEGATIVE   Ketones, ur NEGATIVE NEGATIVE mg/dL   Protein, ur NEGATIVE NEGATIVE mg/dL   Nitrite NEGATIVE NEGATIVE   Leukocytes, UA NEGATIVE NEGATIVE  Rapid urine drug screen (hospital performed)     Status: None   Collection Time: 01/17/17  9:04 AM  Result Value Ref Range   Opiates NONE DETECTED NONE DETECTED   Cocaine NONE DETECTED NONE DETECTED   Benzodiazepines NONE DETECTED NONE DETECTED   Amphetamines NONE DETECTED NONE DETECTED   Tetrahydrocannabinol NONE DETECTED NONE DETECTED   Barbiturates NONE DETECTED NONE DETECTED  CBC     Status: Abnormal   Collection Time: 01/17/17  9:47 AM  Result Value Ref Range   WBC 10.3 4.0 - 10.5 K/uL   RBC 3.87 3.87 - 5.11 MIL/uL   Hemoglobin 9.4 (L) 12.0 - 15.0 g/dL   HCT 78.2 (L) 95.6 - 21.3 %   MCV 73.6 (L) 78.0 - 100.0 fL   MCH 24.3 (L) 26.0 - 34.0 pg   MCHC 33.0 30.0 - 36.0 g/dL   RDW 08.6 57.8 - 46.9 %   Platelets 200 150 - 400 K/uL  Comprehensive metabolic panel     Status: Abnormal   Collection Time: 01/17/17  9:47 AM  Result Value Ref Range   Sodium 137 135 - 145 mmol/L   Potassium 3.2 (L) 3.5 - 5.1 mmol/L   Chloride 106 101 - 111 mmol/L   CO2 22 22 - 32 mmol/L   Glucose, Bld 101 (H) 65 - 99 mg/dL   BUN 5 (L) 6 - 20 mg/dL   Creatinine,  Ser 6.29 0.44 - 1.00 mg/dL   Calcium 8.4 (L) 8.9 - 10.3 mg/dL   Total Protein 7.0 6.5 - 8.1 g/dL   Albumin 2.9 (L) 3.5 - 5.0 g/dL   AST 16 15 - 41 U/L   ALT 11 (L) 14 - 54 U/L   Alkaline Phosphatase 117 38 - 126 U/L   Total Bilirubin 0.7 0.3 - 1.2 mg/dL  GFR calc non Af Amer >60 >60 mL/min   GFR calc Af Amer >60 >60 mL/min   Anion gap 9 5 - 15  Wet prep, genital     Status: Abnormal   Collection Time: 01/17/17 11:48 AM  Result Value Ref Range   Yeast Wet Prep HPF POC NONE SEEN NONE SEEN   Trich, Wet Prep NONE SEEN NONE SEEN   Clue Cells Wet Prep HPF POC NONE SEEN NONE SEEN   WBC, Wet Prep HPF POC FEW (A) NONE SEEN   Sperm NONE SEEN       Imaging:  [place] Mfm Fetal Bpp Wo Non Stress  Result Date: 01/17/2017 ----------------------------------------------------------------------  OBSTETRICS REPORT                      (Signed Final 01/17/2017 11:37 am) ---------------------------------------------------------------------- Patient Info  ID #:       [id number]                         D.O.B.:   08/11/1991 (25 yrs)  Name:       Amanda Murillo                 Visit Date:  01/17/2017 11:06 am ---------------------------------------------------------------------- Performed By  Performed By:     Earley Brooke     Ref. Address:     [address]                    [place], [place]                                                             Rd                                                             Jacky Kindle  Attending:        Clarene Critchley Whitecar        Secondary Phy.:   MAU Nursing-                    MD                                                             MAU/Triage  Referred By:      Curtis Sites-       Location:         [facility]                    [facility] CNM ---------------------------------------------------------------------- Orders   #  Description                                 Code   1  [place] MFM FETAL BPP WO NON STRESS  86578.46   ----------------------------------------------------------------------   #  Ordered By               Order #        Accession #    Episode #   1  LISA LEFTWICH-           [id number]      [id number]     [id number]      KIRBY  ---------------------------------------------------------------------- Indications   [redacted] weeks gestation of pregnancy                Z3A.35   Cholestasis of pregnancy, third trimester      V42.595G38.7   Poor obstetric history: Previous IUFD          O09.299   (Trisomy 18)  ---------------------------------------------------------------------- OB History  Gravidity:    4         Term:   3        Prem:   0        SAB:   0  TOP:          0       Ectopic:  0        Living: 2 ---------------------------------------------------------------------- Fetal Evaluation  Num Of Fetuses:     1  Fetal Heart         127  Rate(bpm):  Cardiac Activity:   Observed  Presentation:       Cephalic  Amniotic Fluid  AFI FV:      Subjectively within normal limits  AFI Sum(cm)     %Tile       Largest Pocket(cm)  10.2            22          3.88  RUQ(cm)       RLQ(cm)       LUQ(cm)        LLQ(cm)  3.38          3.88          1.54           1.4 ---------------------------------------------------------------------- Biophysical Evaluation  Amniotic F.V:   Pocket => 2 cm two         F. Tone:        Observed                  planes  F. Movement:    Observed                   Score:          8/8  F. Breathing:   Observed ---------------------------------------------------------------------- Gestational Age  LMP:           35w 2d       Date:   05/15/16                 EDD:   02/19/17  Best:          Consuello Closs 2d    Det. By:   LMP  (05/15/16)          EDD:   02/19/17 ---------------------------------------------------------------------- Impression  Single IUP at 35w 2d  Cephalic presentation  BPP 8/8  Normal amniotic fluid volume ---------------------------------------------------------------------- Recommendations  Continue antenatal  testing as previously scheduled ----------------------------------------------------------------------                Candis Shine, MD Electronically Signed Final Report   01/17/2017 11:37 am ----------------------------------------------------------------------   MAU Course/MDM: I have  ordered labs and reviewed results.  NST reviewed. BPP done b/c pt needs twice weekly testing based on cholestasis of pregnancy  Consult Dr Jolayne Panther with presentation, exam findings and test results.   BPP 8/8, reactive NST.  Pt given Vistaril 50 mg PO and Actigall 300 mg PO x 1 dose in MAU.  Also given lidocaine jelly 2% for vaginal use r/t itching/vaginal pain.    Reviewed chart, EDD based on LMP with 12 week [place] only 5 days different. [facility] was using [place] date for EDD but per ACOG guidelines, LMP is supported by 12 week [place] so EDD should remain 02/19/17.  Discussed with pt today.    D/C home. Rx for Ursodiol 300 mg TID, Vistaril 25-50 mg TID PRN, Keflex 500 mg QID, and lidocaine jelly topical PRN.    Pt to keep appt on 3/21 with Dr Shawnie Pons in [facility] Pt stable at time of discharge.   Assessment: 1. Cholestasis of pregnancy in third trimester   2. Cholestasis during pregnancy in third trimester   3. Pain in symphysis pubis during pregnancy   4. Boil, vagina     Plan: Discharge home Labor precautions and fetal kick counts Follow-up Information    [facility].   Specialty:  Obstetrics and Gynecology Why:  Keep scheduled appointment in [facility] at 9:20 am on Wednesday, 01/24/17. Return to MAU as needed for emergencies. Contact information: [address] [place] [address] [phone]         Allergies as of 01/17/2017   No Known Allergies     Medication List    STOP taking these medications   triamcinolone ointment 0.5 % Commonly known as:  KENALOG     TAKE these medications   acetaminophen 325 MG tablet Commonly known  as:  TYLENOL Take 650 mg by mouth every 6 (six) hours as needed for mild pain, moderate pain or headache.   albuterol 108 (90 Base) MCG/ACT inhaler Commonly known as:  PROVENTIL HFA;VENTOLIN HFA Inhale 2 puffs into the lungs every 6 (six) hours as needed for wheezing or shortness of breath.   cephALEXin 500 MG capsule Commonly known as:  KEFLEX Take 1 capsule (500 mg total) by mouth 4 (four) times daily.   hydrOXYzine 50 MG capsule Commonly known as:  VISTARIL Take 1 capsule (50 mg total) by mouth 3 (three) times daily as needed.   IRON PO Take 3 tablets by mouth daily. Pt not sure of strength   lidocaine 2 % jelly Commonly known as:  XYLOCAINE Apply 1 application topically as needed.   prenatal multivitamin Tabs tablet Take 1 tablet by mouth daily at 12 noon.   PRESCRIPTION MEDICATION Apply 1 application topically daily as needed (itching). Hydrocortisone cream- pt not sure of strength   ursodiol 300 MG capsule Commonly known as:  ACTIGALL Take 1 capsule (300 mg total) by mouth 3 (three) times daily.   valACYclovir 1000 MG tablet Commonly known as:  VALTREX Take 1 tablet (1,000 mg total) by mouth 2 (two) times daily.       Sharen Counter Certified Nurse-Midwife 01/17/2017 2:12 PM

## 2017-01-18 LAB — GC/CHLAMYDIA PROBE AMP (~~LOC~~) NOT AT ARMC
Chlamydia: NEGATIVE
Neisseria Gonorrhea: NEGATIVE

## 2017-01-18 LAB — CULTURE, BETA STREP (GROUP B ONLY)

## 2017-01-19 ENCOUNTER — Encounter: Payer: Self-pay | Admitting: Family Medicine

## 2017-01-19 DIAGNOSIS — O99820 Streptococcus B carrier state complicating pregnancy: Secondary | ICD-10-CM | POA: Insufficient documentation

## 2017-01-22 LAB — OB RESULTS CONSOLE GBS: GBS: NEGATIVE

## 2017-01-23 ENCOUNTER — Encounter: Payer: Self-pay | Admitting: *Deleted

## 2017-01-24 ENCOUNTER — Ambulatory Visit (INDEPENDENT_AMBULATORY_CARE_PROVIDER_SITE_OTHER): Payer: Medicaid Other | Admitting: Family Medicine

## 2017-01-24 ENCOUNTER — Encounter: Payer: Self-pay | Admitting: Family Medicine

## 2017-01-24 ENCOUNTER — Telehealth (HOSPITAL_COMMUNITY): Payer: Self-pay | Admitting: *Deleted

## 2017-01-24 ENCOUNTER — Other Ambulatory Visit: Payer: Self-pay | Admitting: Advanced Practice Midwife

## 2017-01-24 VITALS — BP 96/64 | HR 92 | Wt 174.9 lb

## 2017-01-24 DIAGNOSIS — O99820 Streptococcus B carrier state complicating pregnancy: Secondary | ICD-10-CM | POA: Diagnosis not present

## 2017-01-24 DIAGNOSIS — O26643 Intrahepatic cholestasis of pregnancy, third trimester: Secondary | ICD-10-CM

## 2017-01-24 DIAGNOSIS — O0990 Supervision of high risk pregnancy, unspecified, unspecified trimester: Secondary | ICD-10-CM | POA: Insufficient documentation

## 2017-01-24 DIAGNOSIS — O26613 Liver and biliary tract disorders in pregnancy, third trimester: Secondary | ICD-10-CM | POA: Diagnosis not present

## 2017-01-24 DIAGNOSIS — K831 Obstruction of bile duct: Secondary | ICD-10-CM

## 2017-01-24 DIAGNOSIS — O0993 Supervision of high risk pregnancy, unspecified, third trimester: Secondary | ICD-10-CM

## 2017-01-24 LAB — POCT URINALYSIS DIP (DEVICE)
Bilirubin Urine: NEGATIVE
GLUCOSE, UA: NEGATIVE mg/dL
HGB URINE DIPSTICK: NEGATIVE
KETONES UR: NEGATIVE mg/dL
Nitrite: NEGATIVE
PH: 6.5 (ref 5.0–8.0)
PROTEIN: 30 mg/dL — AB
SPECIFIC GRAVITY, URINE: 1.02 (ref 1.005–1.030)
Urobilinogen, UA: 1 mg/dL (ref 0.0–1.0)

## 2017-01-24 NOTE — Progress Notes (Signed)
Declined flu

## 2017-01-24 NOTE — Progress Notes (Signed)
   PRENATAL VISIT NOTE  Subjective:  Amanda Murillo is a 26 y.o. [id number] at 2812w2d being seen today [facility]  She is currently monitored for the following issues for this high-risk pregnancy and has Previous stillbirth or demise, antepartum; Anemia; Prolonged QT interval; Gastritis and gastroduodenitis; Cholestasis of pregnancy in third trimester; GBS (group B Streptococcus carrier), +RV culture, currently pregnant; and Supervision of high-risk pregnancy on her problem list.  Patient reports itching.  Contractions: Irregular. Vag. Bleeding: None.  Movement: Present. Denies leaking of fluid.   The following portions of the patient's history were reviewed and updated as appropriate: allergies, current medications, past family history, past medical history, past social history, past surgical history and problem list. Problem list updated.  Objective:   Vitals:   01/24/17 0929  BP: 96/64  Pulse: 92  Weight: 174 lb 14.4 oz (79.3 kg)    Fetal Status: Fetal Heart Rate (bpm): 148 Fundal Height: 35 cm Movement: Present  Presentation: Vertex  General:  Alert, oriented and cooperative. Patient is in no acute distress.  Skin: Skin is warm and dry. No rash noted.   Cardiovascular: Normal heart rate noted  Respiratory: Normal respiratory effort, no problems with respiration noted  Abdomen: Soft, gravid, appropriate for gestational age. Pain/Pressure: Present     Pelvic:  Cervical exam deferred        Extremities: Normal range of motion.  Edema: Trace  Mental Status: Normal mood and affect. Normal behavior. Normal judgment and thought content.   Assessment and Plan:  Pregnancy: [id number] at 2812w2d  1. Cholestasis of pregnancy in third trimester BPP 8/8 on 3/19 Schedule IOL at 37 wks  2. GBS (group B Streptococcus carrier), +RV culture, currently pregnant Will need treatment in labor  3. Supervision of high risk pregnancy in third  trimester   Preterm labor symptoms and general obstetric precautions including but not limited to vaginal bleeding, contractions, leaking of fluid and fetal movement were reviewed in detail with the patient. Please refer to After Visit Summary for other counseling recommendations.    Reva Boresanya S Jowana Thumma, MD

## 2017-01-24 NOTE — Telephone Encounter (Signed)
Preadmission screen  

## 2017-01-24 NOTE — Patient Instructions (Signed)
Cholestasis of Pregnancy Cholestasis refers to any condition that causes the flow of digestive fluid (bile) produced by the liver to slow down or stop. Cholestasis of pregnancy is most common toward the end of pregnancy (thirdtrimester), but it can occur any time during pregnancy. The condition often goes away soon after giving birth. Cholestasis may be uncomfortable, but it is usually harmless to you. However, it can be harmful to your baby. Cholestasis may increase the risk of:  Your baby being born too early (preterm delivery).  Your baby having a slow heart rate and lack of oxygen during delivery (fetal distress).  Losing your baby before delivery (stillbirth). What are the causes? The exact cause of this condition is not known, but it may be related to:  Pregnancy hormones. The gallbladder normally holds bile until you need it to help digest fat in your diet. Pregnancy hormones may cause the flow of bile to slow down and back up into your liver. Bile may then get into your bloodstream and cause cholestasis symptoms.  Changes in your genes (genetic mutations). Specifically, genes that affect how the liver releases bile. What increases the risk? You are more likely to develop this condition if:  You had cholestasis during a previous pregnancy.  You have a family history of cholestasis.  You have liver problems.  You are having multiple babies, such as twins or triplets. What are the signs or symptoms? The most common symptom of this condition is intense itching (pruritus), especially on the palms of your hands and soles of your feet. The itching can spread to the rest of your body and is often worse at night. You will not usually have a rash. Other symptoms may include:  Feeling tired.  Pain in your upper right abdomen.  Dark-colored urine.  Light-colored stools.  Poor appetite.  Yellowish discoloration of your skin and the whites of your eyes (jaundice). How is this  diagnosed? This condition is diagnosed based on:  Your medical history.  A physical exam.  Blood tests. If you have an inherited risk for developing this condition, you may also have genetic testing. How is this treated? The goal of treatment is to make you comfortable and keep your baby safe. Your health care provider may:  Prescribe medicine to reduce bile acid in your bloodstream, relieve symptoms, and help keep your baby safe.  Give you vitamin K before delivery to prevent excessive bleeding.  Check your baby frequently (fetal monitoring).  Perform regular blood tests to check your bile levels and liver function until your baby is delivered.  Recommend starting (inducing) your labor and delivery by week 36 or 37 of pregnancy, or as soon as your baby's lungs have developed enough. Follow these instructions at home:  Take over-the-counter and prescription medicines only as told by your health care provider.  Take cool baths to soothe itchy skin.  Wear comfortable, loose-fitting, cotton clothing to reduce itching.  Keep your fingernails short to prevent skin irritation from scratching.  Keep all follow-up visits and prenatal visits as told by your health care provider. This is important. Contact a health care provider if:  Your symptoms get worse, even with treatment.  You develop pain in your right side.  You have unusual swelling in your abdomen, feet, ankles, or legs.  You have a fever.  You are more thirsty than usual. Get help right away if:  You go into early labor.  You have a headache that does not go away or causes  changes in vision.  You have nausea or you vomit.  You have severe pain in your abdomen or shoulders.  You have shortness of breath. Summary  Cholestasis of pregnancy is most common toward the end of pregnancy (thirdtrimester), but it can occur any time during your pregnancy.  The condition often goes away soon after your baby is  born.  The most common symptom of cholestasis of pregnancy is intense itching (pruritus), especially on the palms of your hands and soles of your feet.  This condition may be treated with medicine, frequent monitoring, or starting (inducing) labor and delivery by week 36 or 37 of pregnancy. This information is not intended to replace advice given to you by your health care provider. Make sure you discuss any questions you have with your health care provider. Document Released: 10/20/2000 Document Revised: 10/07/2016 Document Reviewed: 10/07/2016 Elsevier Interactive Patient Education  2017 [employer]. Breastfeeding Deciding to breastfeed is one of the best choices you can make for you and your baby. A change in hormones during pregnancy causes your breast tissue to grow and increases the number and size of your milk ducts. These hormones also allow proteins, sugars, and fats from your blood supply to make breast milk in your milk-producing glands. Hormones prevent breast milk from being released before your baby is born as well as prompt milk flow after birth. Once breastfeeding has begun, thoughts of your baby, as well as his or her sucking or crying, can stimulate the release of milk from your milk-producing glands. Benefits of breastfeeding For Your Baby  Your first milk (colostrum) helps your baby's digestive system function better.  There are antibodies in your milk that help your baby fight off infections.  Your baby has a lower incidence of asthma, allergies, and sudden infant death syndrome.  The nutrients in breast milk are better for your baby than infant formulas and are designed uniquely for your baby's needs.  Breast milk improves your baby's brain development.  Your baby is less likely to develop other conditions, such as childhood obesity, asthma, or type 2 diabetes mellitus. For You  Breastfeeding helps to create a very special bond between you and your  baby.  Breastfeeding is convenient. Breast milk is always available at the correct temperature and costs nothing.  Breastfeeding helps to burn calories and helps you lose the weight gained during pregnancy.  Breastfeeding makes your uterus contract to its prepregnancy size faster and slows bleeding (lochia) after you give birth.  Breastfeeding helps to lower your risk of developing type 2 diabetes mellitus, osteoporosis, and breast or ovarian cancer later in life. Signs that your baby is hungry Early Signs of Hunger  Increased alertness or activity.  Stretching.  Movement of the head from side to side.  Movement of the head and opening of the mouth when the corner of the mouth or cheek is stroked (rooting).  Increased sucking sounds, smacking lips, cooing, sighing, or squeaking.  Hand-to-mouth movements.  Increased sucking of fingers or hands. Late Signs of Hunger  Fussing.  Intermittent crying. Extreme Signs of Hunger  Signs of extreme hunger will require calming and consoling before your baby will be able to breastfeed successfully. Do not wait for the following signs of extreme hunger to occur before you initiate breastfeeding:  Restlessness.  A loud, strong cry.  Screaming. Breastfeeding basics  Breastfeeding Initiation  Find a comfortable place to sit or lie down, with your neck and back well supported.  Place a pillow or  rolled up blanket under your baby to bring him or her to the level of your breast (if you are seated). Nursing pillows are specially designed to help support your arms and your baby while you breastfeed.  Make sure that your baby's abdomen is facing your abdomen.  Gently massage your breast. With your fingertips, massage from your chest wall toward your nipple in a circular motion. This encourages milk flow. You may need to continue this action during the feeding if your milk flows slowly.  Support your breast with 4 fingers underneath and your  thumb above your nipple. Make sure your fingers are well away from your nipple and your baby's mouth.  Stroke your baby's lips gently with your finger or nipple.  When your baby's mouth is open wide enough, quickly bring your baby to your breast, placing your entire nipple and as much of the colored area around your nipple (areola) as possible into your baby's mouth.  More areola should be visible above your baby's upper lip than below the lower lip.  Your baby's tongue should be between his or her lower gum and your breast.  Ensure that your baby's mouth is correctly positioned around your nipple (latched). Your baby's lips should create a seal on your breast and be turned out (everted).  It is common for your baby to suck about 2-3 minutes in order to start the flow of breast milk. Latching  Teaching your baby how to latch on to your breast properly is very important. An improper latch can cause nipple pain and decreased milk supply for you and poor weight gain in your baby. Also, if your baby is not latched onto your nipple properly, he or she may swallow some air during feeding. This can make your baby fussy. Burping your baby when you switch breasts during the feeding can help to get rid of the air. However, teaching your baby to latch on properly is still the best way to prevent fussiness from swallowing air while breastfeeding. Signs that your baby has successfully latched on to your nipple:  Silent tugging or silent sucking, without causing you pain.  Swallowing heard between every 3-4 sucks.  Muscle movement above and in front of his or her ears while sucking. Signs that your baby has not successfully latched on to nipple:  Sucking sounds or smacking sounds from your baby while breastfeeding.  Nipple pain. If you think your baby has not latched on correctly, slip your finger into the corner of your baby's mouth to break the suction and place it between your baby's gums. Attempt  breastfeeding initiation again. Signs of Successful Breastfeeding  Signs from your baby:  A gradual decrease in the number of sucks or complete cessation of sucking.  Falling asleep.  Relaxation of his or her body.  Retention of a small amount of milk in his or her mouth.  Letting go of your breast by himself or herself. Signs from you:  Breasts that have increased in firmness, weight, and size 1-3 hours after feeding.  Breasts that are softer immediately after breastfeeding.  Increased milk volume, as well as a change in milk consistency and color by the fifth day of breastfeeding.  Nipples that are not sore, cracked, or bleeding. Signs That Your Pecola Leisure is Getting Enough Milk  Wetting at least 1-2 diapers during the first 24 hours after birth.  Wetting at least 5-6 diapers every 24 hours for the first week after birth. The urine should be clear or  pale yellow by 5 days after birth.  Wetting 6-8 diapers every 24 hours as your baby continues to grow and develop.  At least 3 stools in a 24-hour period by age 21 days. The stool should be soft and yellow.  At least 3 stools in a 24-hour period by age 73 days. The stool should be seedy and yellow.  No loss of weight greater than 10% of birth weight during the first 62 days of age.  Average weight gain of 4-7 ounces (113-198 g) per week after age 668 days.  Consistent daily weight gain by age 21 days, without weight loss after the age of 2 weeks. After a feeding, your baby may spit up a small amount. This is common. Breastfeeding frequency and duration Frequent feeding will help you make more milk and can prevent sore nipples and breast engorgement. Breastfeed when you feel the need to reduce the fullness of your breasts or when your baby shows signs of hunger. This is called "breastfeeding on demand." Avoid introducing a pacifier to your baby while you are working to establish breastfeeding (the first 4-6 weeks after your baby is born).  After this time you may choose to use a pacifier. Research has shown that pacifier use during the first year of a baby's life decreases the risk of sudden infant death syndrome (SIDS). Allow your baby to feed on each breast as long as he or she wants. Breastfeed until your baby is finished feeding. When your baby unlatches or falls asleep while feeding from the first breast, offer the second breast. Because newborns are often sleepy in the first few weeks of life, you may need to awaken your baby to get him or her to feed. Breastfeeding times will vary from baby to baby. However, the following rules can serve as a guide to help you ensure that your baby is properly fed:  Newborns (babies 10 weeks of age or younger) may breastfeed every 1-3 hours.  Newborns should not go longer than 3 hours during the day or 5 hours during the night without breastfeeding.  You should breastfeed your baby a minimum of 8 times in a 24-hour period until you begin to introduce solid foods to your baby at around 34 months of age. Breast milk pumping Pumping and storing breast milk allows you to ensure that your baby is exclusively fed your breast milk, even at times when you are unable to breastfeed. This is especially important if you are going back to work while you are still breastfeeding or when you are not able to be present during feedings. Your lactation consultant can give you guidelines on how long it is safe to store breast milk. A breast pump is a machine that allows you to pump milk from your breast into a sterile bottle. The pumped breast milk can then be stored in a refrigerator or freezer. Some breast pumps are operated by hand, while others use electricity. Ask your lactation consultant which type will work best for you. Breast pumps can be purchased, but some hospitals and breastfeeding support groups lease breast pumps on a monthly basis. A lactation consultant can teach you how to hand express breast milk, if  you prefer not to use a pump. Caring for your breasts while you breastfeed Nipples can become dry, cracked, and sore while breastfeeding. The following recommendations can help keep your breasts moisturized and healthy:  Avoid using soap on your nipples.  Wear a supportive bra. Although not required, special nursing  bras and tank tops are designed to allow access to your breasts for breastfeeding without taking off your entire bra or top. Avoid wearing underwire-style bras or extremely tight bras.  Air dry your nipples for 3-374minutes after each feeding.  Use only cotton bra pads to absorb leaked breast milk. Leaking of breast milk between feedings is normal.  Use lanolin on your nipples after breastfeeding. Lanolin helps to maintain your skin's normal moisture barrier. If you use pure lanolin, you do not need to wash it off before feeding your baby again. Pure lanolin is not toxic to your baby. You may also hand express a few drops of breast milk and gently massage that milk into your nipples and allow the milk to air dry. In the first few weeks after giving birth, some women experience extremely full breasts (engorgement). Engorgement can make your breasts feel heavy, warm, and tender to the touch. Engorgement peaks within 3-5 days after you give birth. The following recommendations can help ease engorgement:  Completely empty your breasts while breastfeeding or pumping. You may want to start by applying warm, moist heat (in the shower or with warm water-soaked hand towels) just before feeding or pumping. This increases circulation and helps the milk flow. If your baby does not completely empty your breasts while breastfeeding, pump any extra milk after he or she is finished.  Wear a snug bra (nursing or regular) or tank top for 1-2 days to signal your body to slightly decrease milk production.  Apply ice packs to your breasts, unless this is too uncomfortable for you.  Make sure that your  baby is latched on and positioned properly while breastfeeding. If engorgement persists after 48 hours of following these recommendations, contact your health care provider or a Advertising copywriterlactation consultant. Overall health care recommendations while breastfeeding  Eat healthy foods. Alternate between meals and snacks, eating 3 of each per day. Because what you eat affects your breast milk, some of the foods may make your baby more irritable than usual. Avoid eating these foods if you are sure that they are negatively affecting your baby.  Drink milk, fruit juice, and water to satisfy your thirst (about 10 glasses a day).  Rest often, relax, and continue to take your prenatal vitamins to prevent fatigue, stress, and anemia.  Continue breast self-awareness checks.  Avoid chewing and smoking tobacco. Chemicals from cigarettes that pass into breast milk and exposure to secondhand smoke may harm your baby.  Avoid alcohol and drug use, including marijuana. Some medicines that may be harmful to your baby can pass through breast milk. It is important to ask your health care provider before taking any medicine, including all over-the-counter and prescription medicine as well as vitamin and herbal supplements. It is possible to become pregnant while breastfeeding. If birth control is desired, ask your health care provider about options that will be safe for your baby. Contact a health care provider if:  You feel like you want to stop breastfeeding or have become frustrated with breastfeeding.  You have painful breasts or nipples.  Your nipples are cracked or bleeding.  Your breasts are red, tender, or warm.  You have a swollen area on either breast.  You have a fever or chills.  You have nausea or vomiting.  You have drainage other than breast milk from your nipples.  Your breasts do not become full before feedings by the fifth day after you give birth.  You feel sad and depressed.  Your baby is  too sleepy to eat well.  Your baby is having trouble sleeping.  Your baby is wetting less than 3 diapers in a 24-hour period.  Your baby has less than 3 stools in a 24-hour period.  Your baby's skin or the white part of his or her eyes becomes yellow.  Your baby is not gaining weight by 63 days of age. Get help right away if:  Your baby is overly tired (lethargic) and does not want to wake up and feed.  Your baby develops an unexplained fever. This information is not intended to replace advice given to you by your health care provider. Make sure you discuss any questions you have with your health care provider. Document Released: 10/23/2005 Document Revised: 04/05/2016 Document Reviewed: 04/16/2013 Elsevier Interactive Patient Education  2017 [employer].

## 2017-01-25 LAB — OB RESULTS CONSOLE GBS: STREP GROUP B AG: POSITIVE

## 2017-01-29 ENCOUNTER — Inpatient Hospital Stay (HOSPITAL_COMMUNITY)
Admission: RE | Admit: 2017-01-29 | Discharge: 2017-01-31 | DRG: 775 | Disposition: A | Discharge status: Home / Self Care | Payer: Medicaid Other | Source: Ambulatory Visit | Attending: Family Medicine | Admitting: Family Medicine

## 2017-01-29 ENCOUNTER — Inpatient Hospital Stay (HOSPITAL_COMMUNITY): Payer: Medicaid Other | Admitting: Anesthesiology

## 2017-01-29 ENCOUNTER — Encounter (HOSPITAL_COMMUNITY): Payer: Self-pay

## 2017-01-29 DIAGNOSIS — J45909 Unspecified asthma, uncomplicated: Secondary | ICD-10-CM | POA: Diagnosis present

## 2017-01-29 DIAGNOSIS — O0993 Supervision of high risk pregnancy, unspecified, third trimester: Secondary | ICD-10-CM

## 2017-01-29 DIAGNOSIS — Z3A37 37 weeks gestation of pregnancy: Secondary | ICD-10-CM | POA: Diagnosis not present

## 2017-01-29 DIAGNOSIS — O9952 Diseases of the respiratory system complicating childbirth: Secondary | ICD-10-CM | POA: Diagnosis present

## 2017-01-29 DIAGNOSIS — O26613 Liver and biliary tract disorders in pregnancy, third trimester: Secondary | ICD-10-CM

## 2017-01-29 DIAGNOSIS — O99824 Streptococcus B carrier state complicating childbirth: Secondary | ICD-10-CM | POA: Diagnosis present

## 2017-01-29 DIAGNOSIS — K831 Obstruction of bile duct: Secondary | ICD-10-CM | POA: Diagnosis present

## 2017-01-29 DIAGNOSIS — O2662 Liver and biliary tract disorders in childbirth: Secondary | ICD-10-CM | POA: Diagnosis present

## 2017-01-29 LAB — CBC
HCT: 29.3 % — ABNORMAL LOW (ref 36.0–46.0)
HEMOGLOBIN: 9.4 g/dL — AB (ref 12.0–15.0)
MCH: 23 pg — ABNORMAL LOW (ref 26.0–34.0)
MCHC: 32.1 g/dL (ref 30.0–36.0)
MCV: 71.8 fL — ABNORMAL LOW (ref 78.0–100.0)
Platelets: 222 10*3/uL (ref 150–400)
RBC: 4.08 MIL/uL (ref 3.87–5.11)
RDW: 15.5 % (ref 11.5–15.5)
WBC: 10 10*3/uL (ref 4.0–10.5)

## 2017-01-29 LAB — TYPE AND SCREEN
ABO/RH(D): B POS
Antibody Screen: NEGATIVE

## 2017-01-29 LAB — RPR: RPR: NONREACTIVE

## 2017-01-29 MED ORDER — PENICILLIN G POTASSIUM 5000000 UNITS IJ SOLR
5.0000 10*6.[IU] | Freq: Once | INTRAVENOUS | Status: AC
  Administered 2017-01-30: 5 10*6.[IU] via INTRAVENOUS
  Filled 2017-01-29 (×2): qty 5

## 2017-01-29 MED ORDER — OXYCODONE-ACETAMINOPHEN 5-325 MG PO TABS: 1.0000 | ORAL_TABLET | ORAL | Status: DC | PRN

## 2017-01-29 MED ORDER — FENTANYL CITRATE (PF) 100 MCG/2ML IJ SOLN
100.0000 ug | INTRAMUSCULAR | Status: DC | PRN
  Administered 2017-01-29 (×2): 100 ug via INTRAVENOUS
  Filled 2017-01-29 (×2): qty 2

## 2017-01-29 MED ORDER — PROMETHAZINE HCL 25 MG/ML IJ SOLN
12.5000 mg | Freq: Three times a day (TID) | INTRAMUSCULAR | Status: DC | PRN
  Administered 2017-01-30: 12.5 mg via INTRAVENOUS
  Filled 2017-01-29: qty 1

## 2017-01-29 MED ORDER — ONDANSETRON HCL 4 MG/2ML IJ SOLN: 4.0000 mg | Freq: Four times a day (QID) | INTRAMUSCULAR | Status: DC | PRN

## 2017-01-29 MED ORDER — OXYCODONE-ACETAMINOPHEN 5-325 MG PO TABS: 2.0000 | ORAL_TABLET | ORAL | Status: DC | PRN

## 2017-01-29 MED ORDER — LACTATED RINGERS IV SOLN
500.0000 mL | Freq: Once | INTRAVENOUS | Status: AC
Start: 2017-01-29 — End: 2017-01-29
  Administered 2017-01-29: 500 mL via INTRAVENOUS

## 2017-01-29 MED ORDER — SOD CITRATE-CITRIC ACID 500-334 MG/5ML PO SOLN: 30.0000 mL | ORAL | Status: DC | PRN

## 2017-01-29 MED ORDER — CEPHALEXIN 500 MG PO CAPS
500.0000 mg | ORAL_CAPSULE | Freq: Two times a day (BID) | ORAL | Status: DC
  Administered 2017-01-29 – 2017-01-31 (×5): 500 mg via ORAL
  Filled 2017-01-29 (×7): qty 1

## 2017-01-29 MED ORDER — HYDROXYZINE HCL 50 MG PO TABS
50.0000 mg | ORAL_TABLET | Freq: Three times a day (TID) | ORAL | Status: DC | PRN
  Administered 2017-01-29: 50 mg via ORAL
  Filled 2017-01-29 (×2): qty 1

## 2017-01-29 MED ORDER — TERBUTALINE SULFATE 1 MG/ML IJ SOLN
0.2500 mg | Freq: Once | INTRAMUSCULAR | Status: DC | PRN
  Filled 2017-01-29: qty 1

## 2017-01-29 MED ORDER — PENICILLIN G POT IN DEXTROSE 60000 UNIT/ML IV SOLN
3.0000 10*6.[IU] | INTRAVENOUS | Status: DC
  Administered 2017-01-30: 3 10*6.[IU] via INTRAVENOUS
  Filled 2017-01-29 (×7): qty 50

## 2017-01-29 MED ORDER — PHENYLEPHRINE 40 MCG/ML (10ML) SYRINGE FOR IV PUSH (FOR BLOOD PRESSURE SUPPORT)
80.0000 ug | PREFILLED_SYRINGE | INTRAVENOUS | Status: DC | PRN
  Filled 2017-01-29: qty 5

## 2017-01-29 MED ORDER — ACETAMINOPHEN 325 MG PO TABS: 650.0000 mg | ORAL_TABLET | ORAL | Status: DC | PRN

## 2017-01-29 MED ORDER — LACTATED RINGERS IV SOLN
INTRAVENOUS | Status: DC
  Administered 2017-01-29 (×2): via INTRAVENOUS

## 2017-01-29 MED ORDER — EPHEDRINE 5 MG/ML INJ
10.0000 mg | INTRAVENOUS | Status: DC | PRN
  Filled 2017-01-29: qty 2

## 2017-01-29 MED ORDER — LACTATED RINGERS IV SOLN
500.0000 mL | INTRAVENOUS | Status: DC | PRN
Start: 2017-01-29 — End: 2017-01-30
  Administered 2017-01-30: 500 mL via INTRAVENOUS

## 2017-01-29 MED ORDER — URSODIOL 300 MG PO CAPS
300.0000 mg | ORAL_CAPSULE | Freq: Three times a day (TID) | ORAL | Status: DC
  Administered 2017-01-29 – 2017-01-30 (×4): 300 mg via ORAL
  Filled 2017-01-29 (×4): qty 1

## 2017-01-29 MED ORDER — OXYTOCIN BOLUS FROM INFUSION
500.0000 mL | Freq: Once | INTRAVENOUS | Status: AC
  Administered 2017-01-30: 500 mL via INTRAVENOUS

## 2017-01-29 MED ORDER — LIDOCAINE HCL (PF) 1 % IJ SOLN
30.0000 mL | INTRAMUSCULAR | Status: DC | PRN
  Filled 2017-01-29: qty 30

## 2017-01-29 MED ORDER — OXYTOCIN 40 UNITS IN LACTATED RINGERS INFUSION - SIMPLE MED: 2.5000 [IU]/h | INTRAVENOUS | Status: DC

## 2017-01-29 MED ORDER — DIPHENHYDRAMINE HCL 50 MG/ML IJ SOLN: 12.5000 mg | INTRAMUSCULAR | Status: DC | PRN

## 2017-01-29 MED ORDER — MISOPROSTOL 25 MCG QUARTER TABLET
25.0000 ug | ORAL_TABLET | ORAL | Status: DC | PRN
  Administered 2017-01-29 (×3): 25 ug via VAGINAL
  Filled 2017-01-29 (×5): qty 1

## 2017-01-29 MED ORDER — PHENYLEPHRINE 40 MCG/ML (10ML) SYRINGE FOR IV PUSH (FOR BLOOD PRESSURE SUPPORT)
PREFILLED_SYRINGE | INTRAVENOUS | Status: AC
  Filled 2017-01-29: qty 20

## 2017-01-29 MED ORDER — FENTANYL 2.5 MCG/ML BUPIVACAINE 1/10 % EPIDURAL INFUSION (WH - ANES)
14.0000 mL/h | INTRAMUSCULAR | Status: DC | PRN
  Administered 2017-01-29 – 2017-01-30 (×2): 14 mL/h via EPIDURAL
  Filled 2017-01-29: qty 100

## 2017-01-29 MED ORDER — LIDOCAINE HCL (PF) 1 % IJ SOLN
INTRAMUSCULAR | Status: DC | PRN
  Administered 2017-01-29 (×2): 5 mL via EPIDURAL

## 2017-01-29 MED ORDER — FENTANYL 2.5 MCG/ML BUPIVACAINE 1/10 % EPIDURAL INFUSION (WH - ANES)
INTRAMUSCULAR | Status: AC
  Filled 2017-01-29: qty 100

## 2017-01-29 NOTE — Progress Notes (Signed)
Patient doing well comfortable with epidural in place. Foley placed. Contracting at this time. Continue to monitor.  Ernestina PennaNicholas Rosa Gambale, MD 01/29/17 2200

## 2017-01-29 NOTE — Progress Notes (Signed)
LABOR PROGRESS NOTE  Subjective: Uncomfortable due to itching, vaginal burning.  Feeling contractions.  Objective: BP 110/65   Pulse 92   Temp 97.7 F (36.5 C) (Oral)   Resp 16   LMP 05/15/2016    Dilation: Closed Effacement (%): Thick Cervical Position: Posterior Station: -3 Presentation: Vertex Exam by:: Dr Sharyn Creamerhodan  Assessment / Plan: 26 y.o. [id number] at 6331w0d here for IOL for cholestasis  Labor: IOL, 2nd cytotec placed Fetal Wellbeing:  Cat I; baseline 125, moderate variability, +accels, no decels Pain Control:  Desires epidural eventually Anticipated MOD:  vaginal Cholestasis:  Induction as above; ursodiol TID + hydroxyzine PRN for itching GBS+:  Start pcn ppx with labor  Charlsie MerlesJulia Rhoden, MD 01/29/2017, 12:08 PM

## 2017-01-29 NOTE — Progress Notes (Signed)
Amanda Murillo is a 26 y.o. [id number] at 1869w0d   Subjective: Patient asking for epidural due to painful contractions.  Objective: BP 102/64   Pulse 69   Temp 97.6 F (36.4 C) (Oral)   Resp 20   Ht 5' (1.524 m)   Wt 175 lb (79.4 kg)   LMP 05/15/2016   SpO2 99%   BMI 34.18 kg/m  No intake/output data recorded. No intake/output data recorded.  FHT:  FHR: 130 bpm, variability: moderate,  accelerations:  Present,  decelerations:  Absent UC:   regular, every 3-5 minutes SVE:   Dilation: Fingertip Effacement (%): Thick Station: -3 Exam by:: Dr. Elenore Paddyhoden  Labs: Lab Results  Component Value Date   WBC 10.0 01/29/2017   HGB 9.4 (L) 01/29/2017   HCT 29.3 (L) 01/29/2017   MCV 71.8 (L) 01/29/2017   PLT 222 01/29/2017    Assessment / Plan: Induction of labor due to cholestasis, on Cytotec. Receiving epidural and will insert FB.  Labor: Progressing normally Preeclampsia:  None Fetal Wellbeing:  Category I Pain Control:  Epidural I/D:  GBS positive on PCN Anticipated MOD:  NSVD  Wendee Beaversavid J McMullen, DO, PGY-1 01/29/2017, 9:14 PM

## 2017-01-29 NOTE — Anesthesia Pain Management Evaluation Note (Signed)
  [facility] Pain Management Visit Note  Patient: Amanda Murillo, 26 y.o., female  "[facility] We have an anesthesia team available at all times to provide care throughout the hospital, including epidural management and anesthesia for C-section. I don't know your plan for the delivery whether it a natural birth, water birth, IV sedation, nitrous supplementation, doula or epidural, but we want to meet your pain goals."   1.Was your pain managed to your expectations on prior hospitalizations?   Yes   2.What is your expectation for pain management during this hospitalization?     Epidural  3.How can we help you reach that goal? epidural  Record the patient's initial score and the patient's pain goal.   Pain: 0  Pain Goal: 3 [facility] wants you to be able to say your pain was always managed very well.  Leonarda Leis 01/29/2017

## 2017-01-29 NOTE — H&P (Signed)
LABOR AND DELIVERY ADMISSION HISTORY AND PHYSICAL NOTE  Amanda Murillo is a 26 y.o. female [phone] with IUP at 1743w0d by LMP c/w 12 week US presenting for IOL for cholestasis. This patient has not been seen regularly in clinic for prenatal care but has been seen periodically in the [facility] where she received prenatal care.  She is having itching, but otherwise is feeling well.  Of note she was prescribed keflex recently for a boil on her outer labia.  This has improved.  She reports positive fetal movement. She denies contractions, leakage of fluid, or vaginal bleeding.  Prenatal History/Complications: Cholestasis of Pregnancy  Past Medical History: Past Medical History:  Diagnosis Date  . Asthma   . Bronchitis   . Trichomonas infection   . Trisomy 18 in child of prior pregnancy, currently pregnant     Past Surgical History: Past Surgical History:  Procedure Laterality Date  . NO PAST SURGERIES    . THERAPEUTIC ABORTION      Obstetrical History: OB History    Gravida Para Term Preterm AB Living   4 3 3  0 0 2   SAB TAB Ectopic Multiple Live Births   0 0 0 0 2      Social History: Social History   Social History  . Marital status: Single    Spouse name: N/A  . Number of children: N/A  . Years of education: N/A   Social History Main Topics  . Smoking status: Never Smoker  . Smokeless tobacco: Never Used  . Alcohol use No  . Drug use: No  . Sexual activity: Yes     Comment: wants depo   Other Topics Concern  . None   Social History Narrative  . None    Family History: Family History  Problem Relation Age of Onset  . Stroke Mother   . Anesthesia problems Neg Hx     Allergies: No Known Allergies  Prescriptions Prior to Admission  Medication Sig Dispense Refill Last Dose  . acetaminophen (TYLENOL) 325 MG tablet Take 650 mg by mouth every 6 (six) hours as needed for mild pain, moderate pain or headache.   Taking  . albuterol (PROVENTIL HFA;VENTOLIN HFA) 108  (90 Base) MCG/ACT inhaler Inhale 2 puffs into the lungs every 6 (six) hours as needed for wheezing or shortness of breath.   Not Taking  . hydrOXYzine (VISTARIL) 50 MG capsule Take 1 capsule (50 mg total) by mouth 3 (three) times daily as needed. 90 capsule 2 Taking  . IRON PO Take 3 tablets by mouth daily. Pt not sure of strength   Taking  . Prenatal Vit-Fe Fumarate-FA (PRENATAL MULTIVITAMIN) TABS tablet Take 1 tablet by mouth daily at 12 noon.   Taking  . ursodiol (ACTIGALL) 300 MG capsule Take 1 capsule (300 mg total) by mouth 3 (three) times daily. 20 capsule 0 Taking     Review of Systems   All systems reviewed and negative except as stated in HPI  Blood pressure (!) 100/50, pulse 85, temperature 97.7 F (36.5 C), temperature source Oral, last menstrual period 05/15/2016. General appearance: alert, cooperative, appears stated age and no distress Lungs: no respiratory distress Heart: regular rate, pulses 2+ Abdomen: soft, non-tender; gravid Extremities: No calf swelling or tenderness Presentation: cephalic by bedside US Fetal monitoring: category I; baseline 120, mod variability, +accels, no decels Uterine activity: irregular, rare  Dilation: Closed Effacement (%): Thick Station: -3 Exam by:: Dr Elenore Paddyhoden  Prenatal labs: ABO, Rh:  B+ Antibody:  negative Rubella: immune RPR:  non-reactive HBsAg:   non-reactive HIV: non reactive (02/27 0000)  GBS: Positive (03/22 0000)  1 hr Glucola: 113 (wnl) Genetic screening:  Too late to care Anatomy US: wnl; boy  Prenatal Transfer Tool  Maternal Diabetes: No Genetic Screening: too late to care Maternal Ultrasounds/Referrals: Normal Fetal Ultrasounds or other Referrals:  None Maternal Substance Abuse:  No Significant Maternal Medications:  Meds include:  Ursodiol, hydroxyzine, keflex, albuterol (rare use) Significant Maternal Lab Results: Lab values include: Group B Strep positive  Results for orders placed or performed during the  hospital encounter of 01/29/17 (from the past 24 hour(s))  CBC   Collection Time: 01/29/17  7:30 AM  Result Value Ref Range   WBC 10.0 4.0 - 10.5 K/uL   RBC 4.08 3.87 - 5.11 MIL/uL   Hemoglobin 9.4 (L) 12.0 - 15.0 g/dL   HCT 16.1 (L) 09.6 - 04.5 %   MCV 71.8 (L) 78.0 - 100.0 fL   MCH 23.0 (L) 26.0 - 34.0 pg   MCHC 32.1 30.0 - 36.0 g/dL   RDW 40.9 81.1 - 91.4 %   Platelets 222 150 - 400 K/uL    Patient Active Problem List   Diagnosis Date Noted  . Cholestasis during pregnancy in third trimester 01/29/2017  . Supervision of high-risk pregnancy 01/24/2017  . GBS (group B Streptococcus carrier), +RV culture, currently pregnant 01/19/2017  . Cholestasis of pregnancy in third trimester 01/17/2017  . Gastritis and gastroduodenitis 02/08/2015  . Anemia 02/02/2015  . Prolonged QT interval 02/02/2015  . Previous stillbirth or demise, antepartum 12/06/2011    Assessment: Amanda Murillo is a 26 y.o. [phone] at [redacted]w[redacted]d here for IOL for cholestasis of pregnancy  #Labor: IOL; cytotec placed at 25 #Pain: Eventually desires epidural #FWB: Category I #ID:  neg #MOF: breast #MOC:depo > nexplanon #Circ:  Planned for outpatient #GBS+: pcn ppx with labor #skin/soft tissue infection:  Cont keflex to complete course x48 hours #cholestasis:  Induction as above; ursodiol TID + hydroxyzine PRN for itching  Charlsie Merles 01/29/2017, 7:48 AM   OB FELLOW HISTORY AND PHYSICAL ATTESTATION  I confirm that I have verified the information documented in the resident's note and that I have also personally performed the physical exam and all medical decision making activities.  Ernestina Penna 01/29/2017, 9:24 AM

## 2017-01-29 NOTE — Anesthesia Preprocedure Evaluation (Signed)
Anesthesia Evaluation  Patient identified by MRN, date of birth, ID band Patient awake    Reviewed: Allergy & Precautions, H&P , Patient's Chart, lab work & pertinent test results  History of Anesthesia Complications Negative for: history of anesthetic complications  Airway Mallampati: II  TM Distance: >3 FB Neck ROM: full    Dental  (+) Teeth Intact   Pulmonary asthma (Rare inhaler use) ,    breath sounds clear to auscultation       Cardiovascular negative cardio ROS   Rhythm:regular Rate:Normal     Neuro/Psych negative neurological ROS  negative psych ROS   GI/Hepatic negative GI ROS, Neg liver ROS,   Endo/Other  negative endocrine ROS  Renal/GU negative Renal ROS     Musculoskeletal   Abdominal   Peds  Hematology   Anesthesia Other Findings       Reproductive/Obstetrics (+) Pregnancy                             Anesthesia Physical  Anesthesia Plan  ASA: II  Anesthesia Plan: Epidural   Post-op Pain Management:    Induction:   Airway Management Planned:   Additional Equipment:   Intra-op Plan:   Post-operative Plan:   Informed Consent: I have reviewed the patients History and Physical, chart, labs and discussed the procedure including the risks, benefits and alternatives for the proposed anesthesia with the patient or authorized representative who has indicated his/her understanding and acceptance.   Dental Advisory Given  Plan Discussed with: Anesthesiologist  Anesthesia Plan Comments:         Anesthesia Quick Evaluation

## 2017-01-29 NOTE — Anesthesia Procedure Notes (Addendum)
Epidural Patient location during procedure: OB Start time: 01/29/2017 8:46 PM End time: 01/29/2017 8:58 PM  Staffing Anesthesiologist: Heather RobertsSINGER, Amanda Murillo Performed: anesthesiologist   Preanesthetic Checklist Completed: patient identified, site marked, pre-op evaluation, timeout performed, IV checked, risks and benefits discussed and monitors and equipment checked  Epidural Patient position: sitting Prep: DuraPrep Patient monitoring: heart rate, cardiac monitor, continuous pulse ox and blood pressure Approach: midline Location: L2-L3 Injection technique: LOR saline  Needle:  Needle type: Tuohy  Needle gauge: 17 G Needle length: 9 cm Needle insertion depth: 7 cm Catheter size: 20 Guage Catheter at skin depth: 12 cm Test dose: negative and Other  Assessment Events: blood not aspirated, injection not painful, no injection resistance and negative IV test  Additional Notes Informed consent obtained prior to proceeding including risk of failure, 1% risk of PDPH, risk of minor discomfort and bruising.  Discussed rare but serious complications including epidural abscess, permanent nerve injury, epidural hematoma.  Discussed alternatives to epidural analgesia and patient desires to proceed.  Timeout performed pre-procedure verifying patient name, procedure, and platelet count.  Patient tolerated procedure well.

## 2017-01-29 NOTE — Progress Notes (Signed)
LABOR PROGRESS NOTE  Subjective: Contractions feeling stronger and more frequent.  Objective: BP 113/68   Pulse 85   Temp 98.1 F (36.7 C) (Axillary)   Resp 18   Ht 5' (1.524 m)   Wt 175 lb (79.4 kg)   LMP 05/15/2016   BMI 34.18 kg/m    Dilation: Fingertip Effacement (%): Thick Cervical Position: Posterior Station: -3 Presentation: Vertex Exam by:: Rhodan  Assessment / Plan: 26 y.o. [id number] at [redacted]w[redacted]d here for IOL for cholestasis  Labor: IOL, 3rd cytotec Fetal Wellbeing:  Category I; baseline 130, moderate variability, +accels, no decels Pain Control:  Plans for epidural Anticipated MOD:  Vaginal Cholestasis: Induction as above; ursodiol TID + hydroxyzine PRN for itching GBS+:  Start pcn ppx with labor  Charlsie MerlesJulia Allecia Bells, MD 01/29/2017, 4:21 PM

## 2017-01-30 ENCOUNTER — Encounter (HOSPITAL_COMMUNITY): Payer: Self-pay

## 2017-01-30 DIAGNOSIS — O2662 Liver and biliary tract disorders in childbirth: Secondary | ICD-10-CM

## 2017-01-30 DIAGNOSIS — Z3A37 37 weeks gestation of pregnancy: Secondary | ICD-10-CM

## 2017-01-30 DIAGNOSIS — O99824 Streptococcus B carrier state complicating childbirth: Secondary | ICD-10-CM

## 2017-01-30 DIAGNOSIS — K831 Obstruction of bile duct: Secondary | ICD-10-CM

## 2017-01-30 MED ORDER — COCONUT OIL OIL: 1.0000 "application " | TOPICAL_OIL | Status: DC | PRN

## 2017-01-30 MED ORDER — SENNOSIDES-DOCUSATE SODIUM 8.6-50 MG PO TABS
2.0000 | ORAL_TABLET | ORAL | Status: DC
  Administered 2017-01-30: 2 via ORAL
  Filled 2017-01-30: qty 2

## 2017-01-30 MED ORDER — OXYTOCIN 40 UNITS IN LACTATED RINGERS INFUSION - SIMPLE MED
1.0000 m[IU]/min | INTRAVENOUS | Status: DC
  Administered 2017-01-30: 2 m[IU]/min via INTRAVENOUS
  Filled 2017-01-30: qty 1000

## 2017-01-30 MED ORDER — TERBUTALINE SULFATE 1 MG/ML IJ SOLN: 0.2500 mg | Freq: Once | INTRAMUSCULAR | Status: DC | PRN

## 2017-01-30 MED ORDER — ACETAMINOPHEN 325 MG PO TABS
650.0000 mg | ORAL_TABLET | ORAL | Status: DC | PRN
  Administered 2017-01-30: 650 mg via ORAL
  Filled 2017-01-30: qty 2

## 2017-01-30 MED ORDER — DIBUCAINE 1 % RE OINT: 1.0000 "application " | TOPICAL_OINTMENT | RECTAL | Status: DC | PRN

## 2017-01-30 MED ORDER — IBUPROFEN 600 MG PO TABS
600.0000 mg | ORAL_TABLET | Freq: Four times a day (QID) | ORAL | Status: DC
Start: 2017-01-30 — End: 2017-01-31
  Administered 2017-01-30 – 2017-01-31 (×5): 600 mg via ORAL
  Filled 2017-01-30 (×5): qty 1

## 2017-01-30 MED ORDER — ZOLPIDEM TARTRATE 5 MG PO TABS: 5.0000 mg | ORAL_TABLET | Freq: Every evening | ORAL | Status: DC | PRN

## 2017-01-30 MED ORDER — TETANUS-DIPHTH-ACELL PERTUSSIS 5-2.5-18.5 LF-MCG/0.5 IM SUSP: 0.5000 mL | Freq: Once | INTRAMUSCULAR | Status: DC

## 2017-01-30 MED ORDER — PROMETHAZINE HCL 25 MG PO TABS: 12.5000 mg | ORAL_TABLET | Freq: Four times a day (QID) | ORAL | Status: DC | PRN

## 2017-01-30 MED ORDER — SIMETHICONE 80 MG PO CHEW: 80.0000 mg | CHEWABLE_TABLET | ORAL | Status: DC | PRN

## 2017-01-30 MED ORDER — OXYCODONE HCL 5 MG PO TABS: 5.0000 mg | ORAL_TABLET | ORAL | Status: DC | PRN

## 2017-01-30 MED ORDER — OXYCODONE HCL 5 MG PO TABS: 10.0000 mg | ORAL_TABLET | ORAL | Status: DC | PRN

## 2017-01-30 MED ORDER — PRENATAL MULTIVITAMIN CH
1.0000 | ORAL_TABLET | Freq: Every day | ORAL | Status: DC
  Administered 2017-01-31: 1 via ORAL
  Filled 2017-01-30: qty 1

## 2017-01-30 MED ORDER — BENZOCAINE-MENTHOL 20-0.5 % EX AERO: 1.0000 "application " | INHALATION_SPRAY | CUTANEOUS | Status: DC | PRN

## 2017-01-30 MED ORDER — WITCH HAZEL-GLYCERIN EX PADS: 1.0000 "application " | MEDICATED_PAD | CUTANEOUS | Status: DC | PRN

## 2017-01-30 NOTE — Progress Notes (Signed)
Amanda Murillo is a 26 y.o. [id number] at 971w1d   Subjective: Patients pain well controlled on epidural.  Objective: BP 110/73   Pulse 77   Temp 97.7 F (36.5 C) (Oral)   Resp 16   Ht 5' (1.524 m)   Wt 175 lb (79.4 kg)   LMP 05/15/2016   SpO2 100%   BMI 34.18 kg/m  No intake/output data recorded. No intake/output data recorded.  FHT:  FHR: 130 bpm, variability: moderate,  accelerations:  Present,  decelerations:  Absent UC:   regular, every 5 minutes SVE:   Dilation: 5 Effacement (%): 70 Station: -3, -2 Exam by::  (S moyer)  Labs: Lab Results  Component Value Date   WBC 10.0 01/29/2017   HGB 9.4 (L) 01/29/2017   HCT 29.3 (L) 01/29/2017   MCV 71.8 (L) 01/29/2017   PLT 222 01/29/2017    Assessment / Plan: Induction of labor due to cholestasis. FB out, will start Pitocin and titrate to achieve adequate labor.  Labor: Progressing normally Preeclampsia:  None Fetal Wellbeing:  Category I Pain Control:  Epidural I/D:  GBS positive on PCN Anticipated MOD:  NSVD  Wendee Beaversavid J McMullen, DO, PGY-1 01/30/2017, 5:25 AM

## 2017-01-30 NOTE — Lactation Note (Signed)
This note was copied from a baby's chart. Lactation Consultation Note  P4, Baby 659w1d.  Baby 4 hours old and sleeping in crib. Mother states she breastfed her other children for approx 6 mos but cannot remember.   Mother has breastfed and given this baby formula. Discussed LPI feeding behavior and provided mother with information sheet. Encouraged her to breastfeed first before offering formula. Set up DEBP and encouraged mother to post pump after breastfeeding. Mother falling asleep during consult.  Suggest she contact her RN after nap. Suggest setting an alarm for 30 min to wake baby after mother's nap to feed. Discussed keeping feedings to 30 min and volume guidelines.   Patient Name: Boy Iverson AlaminSapphire Stokke [id number]'XToday's Date: 01/30/2017 Reason for consult: Follow-up assessment   Maternal Data    Feeding Feeding Type: Bottle Fed - Formula Nipple Type: Slow - flow  LATCH Score/Interventions                      Lactation Tools Discussed/Used Pump Review: Setup, frequency, and cleaning Initiated by:: Dahlia Byesuth Berkelhammer RN Date initiated:: 01/30/17   Consult Status Consult Status: Follow-up Date: 01/31/17 Follow-up type: In-patient    Dahlia ByesBerkelhammer, Ruth [facility] 01/30/2017, 2:29 PM

## 2017-01-30 NOTE — Anesthesia Postprocedure Evaluation (Signed)
Anesthesia Post Note  Patient: Amanda Murillo  Procedure(s) Performed: * No procedures listed *  Patient location during evaluation: Mother Baby Anesthesia Type: Epidural Level of consciousness: awake and alert and oriented Pain management: satisfactory to patient Vital Signs Assessment: post-procedure vital signs reviewed and stable Respiratory status: spontaneous breathing and nonlabored ventilation Cardiovascular status: stable Postop Assessment: no headache, no backache, no signs of nausea or vomiting, adequate PO intake and patient able to bend at knees (patient up walking) Anesthetic complications: no        Last Vitals:  Vitals:   01/30/17 1205 01/30/17 1550  BP: (!) 101/59 92/72  Pulse: 61 83  Resp: 20 20  Temp: 37.5 C 36.9 C    Last Pain:  Vitals:   01/30/17 1736  TempSrc:   PainSc: 8    Pain Goal: Patients Stated Pain Goal: 3 (01/29/17 2040)               Madison HickmanGREGORY,Nianna Igo

## 2017-01-31 ENCOUNTER — Ambulatory Visit: Payer: Self-pay

## 2017-01-31 MED ORDER — MEDROXYPROGESTERONE ACETATE 150 MG/ML IM SUSP
150.0000 mg | Freq: Once | INTRAMUSCULAR | Status: AC
  Administered 2017-01-31: 150 mg via INTRAMUSCULAR
  Filled 2017-01-31: qty 1

## 2017-01-31 MED ORDER — IBUPROFEN 600 MG PO TABS: 600.0000 mg | ORAL_TABLET | Freq: Four times a day (QID) | ORAL | 0 refills | Status: DC

## 2017-01-31 NOTE — Discharge Summary (Signed)
OB Discharge Summary  Patient Name: Amanda Murillo DOB: 04-04-1991 MRN: [id number]  Date of admission: 01/29/2017 Delivering MD: Genice Rouge   Date of discharge: 01/31/2017  Admitting diagnosis: INDUCTION Intrauterine pregnancy: [redacted]w[redacted]d     Secondary diagnosis:Active Problems:   Cholestasis during pregnancy in third trimester      Discharge diagnosis: Term Pregnancy Delivered                                                                      Augmentation: Pitocin, Cytotec and Foley Balloon  Complications: None  Hospital course:  Induction of Labor With Vaginal Delivery   25 y.o. yo [id number] at [redacted]w[redacted]d was admitted to the hospital 01/29/2017 for induction of labor.  Indication for induction: Cholestasis of pregnancy.  Patient had an uncomplicated labor course as follows: Membrane Rupture Time/Date: 9:00 AM ,01/30/2017   Intrapartum Procedures: Episiotomy: None [1]                                         Lacerations:  None [1]  Patient had delivery of a Viable infant.  Information for the patient's newborn:  Amanda Murillo, Amanda Murillo [[id number]]  Delivery Method: Vag-Spont   01/30/2017  Details of delivery can be found in separate delivery note.  Patient had a routine postpartum course. Patient is discharged home 01/31/17.  Physical exam  Vitals:   01/30/17 1056 01/30/17 1205 01/30/17 1550 01/31/17 0543  BP: 113/66 (!) 101/59 92/72 103/60  Pulse: (!) 56 61 83 71  Resp: 16 20 20 18   Temp: 99.4 F (37.4 C) 99.5 F (37.5 C) 98.5 F (36.9 C) 98.2 F (36.8 C)  TempSrc: Oral Oral Oral Oral  SpO2:      Weight:      Height:       General: alert Lochia: appropriate Uterine Fundus: firm Incision: N/A DVT Evaluation: No evidence of DVT seen on physical exam. Labs: Lab Results  Component Value Date   WBC 10.0 01/29/2017   HGB 9.4 (L) 01/29/2017   HCT 29.3 (L) 01/29/2017   MCV 71.8 (L) 01/29/2017   PLT 222 01/29/2017   CMP Latest Ref Rng & Units 01/17/2017  Glucose 65 -  99 mg/dL 259(D)  BUN 6 - 20 mg/dL 5(L)  Creatinine 6.38 - 1.00 mg/dL 7.56  Sodium 433 - 295 mmol/L 137  Potassium 3.5 - 5.1 mmol/L 3.2(L)  Chloride 101 - 111 mmol/L 106  CO2 22 - 32 mmol/L 22  Calcium 8.9 - 10.3 mg/dL 1.8(A)  Total Protein 6.5 - 8.1 g/dL 7.0  Total Bilirubin 0.3 - 1.2 mg/dL 0.7  Alkaline Phos 38 - 126 U/L 117  AST 15 - 41 U/L 16  ALT 14 - 54 U/L 11(L)    Discharge instruction: per After Visit Summary and "Baby and Me Booklet".  After Visit Meds:  Allergies as of 01/31/2017   No Known Allergies     Medication List    STOP taking these medications   cephALEXin 500 MG capsule Commonly known as:  KEFLEX   ursodiol 300 MG capsule Commonly known as:  ACTIGALL     TAKE these  medications   hydrOXYzine 50 MG capsule Commonly known as:  VISTARIL Take 1 capsule (50 mg total) by mouth 3 (three) times daily as needed.   ibuprofen 600 MG tablet Commonly known as:  ADVIL,MOTRIN Take 1 tablet (600 mg total) by mouth every 6 (six) hours.   IRON PO Take 3 tablets by mouth daily. Pt not sure of strength   prenatal multivitamin Tabs tablet Take 1 tablet by mouth daily at 12 noon.       Diet: routine diet  Activity: Advance as tolerated. Pelvic rest for 6 weeks.   Outpatient follow up:6 weeks Follow up Appt:No future appointments. Follow up visit: No Follow-up on file.  Postpartum contraception: Depo Provera  Newborn Data: Live born female  Birth Weight: 6 lb 0.5 oz (2735 g) APGAR: 8, 9  Baby Feeding: Breast Disposition:home with mother pending Peds rounding and approval   01/31/2017 Amanda BossierMyra C Mirna Sutcliffe, MD

## 2017-01-31 NOTE — Lactation Note (Signed)
This note was copied from a baby's chart. Lactation Consultation Note  Experienced BF mother reports that baby is not latching so she is using formula. A pump is set up at the bedside but has not been used. Encouraged pumping every 3 hours to support MS. Also encouraged offering the breast first which she states she has been doing. Lactation assistance declined. Will call for help as needed.  Patient Name: Amanda Iverson [name] [id number]'s Date: 01/31/2017 Reason for consult: Initial assessment   Maternal Data    Feeding Feeding Type: Bottle Fed - Formula  LATCH Score/Interventions                      Lactation Tools Discussed/Used     Consult Status      Amanda Murillo, Amanda Murillo 01/31/2017, 2:12 PM

## 2017-01-31 NOTE — Discharge Instructions (Signed)
Levonorgestrel intrauterine device (IUD) °What is this medicine? °LEVONORGESTREL IUD (Jablonowski voe nor jes trel) is a contraceptive (birth control) device. The device is placed inside the uterus by a healthcare professional. It is used to prevent pregnancy. This device can also be used to treat heavy bleeding that occurs during your period. °This medicine may be used for other purposes; ask your health care provider or pharmacist if you have questions. °COMMON BRAND NAME(S): Kyleena, LILETTA, Mirena, Skyla °What should I tell my health care provider before I take this medicine? °They need to know if you have any of these conditions: °-abnormal Pap smear °-cancer of the breast, uterus, or cervix °-diabetes °-endometritis °-genital or pelvic infection now or in the past °-have more than one sexual partner or your partner has more than one partner °-heart disease °-history of an ectopic or tubal pregnancy °-immune system problems °-IUD in place °-liver disease or tumor °-problems with blood clots or take blood-thinners °-seizures °-use intravenous drugs °-uterus of unusual shape °-vaginal bleeding that has not been explained °-an unusual or allergic reaction to levonorgestrel, other hormones, silicone, or polyethylene, medicines, foods, dyes, or preservatives °-pregnant or trying to get pregnant °-breast-feeding °How should I use this medicine? °This device is placed inside the uterus by a health care professional. °Talk to your pediatrician regarding the use of this medicine in children. Special care may be needed. °Overdosage: If you think you have taken too much of this medicine contact a poison control center or emergency room at once. °NOTE: This medicine is only for you. Do not share this medicine with others. °What if I miss a dose? °This does not apply. Depending on the brand of device you have inserted, the device will need to be replaced every 3 to 5 years if you wish to continue using this type of birth  control. °What may interact with this medicine? °Do not take this medicine with any of the following medications: °-amprenavir °-bosentan °-fosamprenavir °This medicine may also interact with the following medications: °-aprepitant °-armodafinil °-barbiturate medicines for inducing sleep or treating seizures °-bexarotene °-boceprevir °-griseofulvin °-medicines to treat seizures like carbamazepine, ethotoin, felbamate, oxcarbazepine, phenytoin, topiramate °-modafinil °-pioglitazone °-rifabutin °-rifampin °-rifapentine °-some medicines to treat HIV infection like atazanavir, efavirenz, indinavir, lopinavir, nelfinavir, tipranavir, ritonavir °-St. John's wort °-warfarin °This list may not describe all possible interactions. Give your health care provider a list of all the medicines, herbs, non-prescription drugs, or dietary supplements you use. Also tell them if you smoke, drink alcohol, or use illegal drugs. Some items may interact with your medicine. °What should I watch for while using this medicine? °Visit your doctor or health care professional for regular check ups. See your doctor if you or your partner has sexual contact with others, becomes HIV positive, or gets a sexual transmitted disease. °This product does not protect you against HIV infection (AIDS) or other sexually transmitted diseases. °You can check the placement of the IUD yourself by reaching up to the top of your vagina with clean fingers to feel the threads. Do not pull on the threads. It is a good habit to check placement after each menstrual period. Call your doctor right away if you feel more of the IUD than just the threads or if you cannot feel the threads at all. °The IUD may come out by itself. You may become pregnant if the device comes out. If you notice that the IUD has come out use a backup birth control method like condoms and call your   health care provider. Using tampons will not change the position of the IUD and are okay to use  during your period. This IUD can be safely scanned with magnetic resonance imaging (MRI) only under specific conditions. Before you have an MRI, tell your healthcare provider that you have an IUD in place, and which type of IUD you have in place. What side effects may I notice from receiving this medicine? Side effects that you should report to your doctor or health care professional as soon as possible: -allergic reactions like skin rash, itching or hives, swelling of the face, lips, or tongue -fever, flu-like symptoms -genital sores -high blood pressure -no menstrual period for 6 weeks during use -pain, swelling, warmth in the leg -pelvic pain or tenderness -severe or sudden headache -signs of pregnancy -stomach cramping -sudden shortness of breath -trouble with balance, talking, or walking -unusual vaginal bleeding, discharge -yellowing of the eyes or skin Side effects that usually do not require medical attention (report to your doctor or health care professional if they continue or are bothersome): -acne -breast pain -change in sex drive or performance -changes in weight -cramping, dizziness, or faintness while the device is being inserted -headache -irregular menstrual bleeding within first 3 to 6 months of use -nausea This list may not describe all possible side effects. Call your doctor for medical advice about side effects. You may report side effects to FDA at 1-800-FDA-1088. Where should I keep my medicine? This does not apply. NOTE: This sheet is a summary. It may not cover all possible information. If you have questions about this medicine, talk to your doctor, pharmacist, or health care provider.  2018 Elsevier/Gold Standard (2016-08-04 14:14:56) Home Care Instructions for Mom  ACTIVITY  Gradually return to your regular activities.  Let yourself rest. Nap while your baby sleeps.  Avoid lifting anything that is heavier than 10 lb (4.5 kg) until your health care  provider says it is okay.  Avoid activities that take a lot of effort and energy (are strenuous) until approved by your health care provider. Walking at a slow-to-moderate pace is usually safe.  If you had a cesarean delivery:  Do not vacuum, climb stairs, or drive a car for 4-6 weeks.  Have someone help you at home until you feel like you can do your usual activities yourself.  Do exercises as told by your health care provider, if this applies. VAGINAL BLEEDING You may continue to bleed for 4-6 weeks after delivery. Over time, the amount of blood usually decreases and the color of the blood usually gets lighter. However, the flow of bright red blood may increase if you have been too active. If you need to use more than one pad in an hour because your pad gets soaked, or if you pass a large clot:  Lie down.  Raise your feet.  Place a cold compress on your lower abdomen.  Rest.  Call your health care provider. If you are breastfeeding, your period should return anytime between 8 weeks after delivery and the time that you stop breastfeeding. If you are not breastfeeding, your period should return 6-8 weeks after delivery. PERINEAL CARE The perineal area, or perineum, is the part of your body between your thighs. After delivery, this area needs special care. Follow these instructions as told by your health care provider.  Take warm tub baths for 15-20 minutes.  Use medicated pads and pain-relieving sprays and creams as told.  Do not use tampons or douches until vaginal  bleeding has stopped.  Each time you go to the bathroom:  Use a peri bottle.  Change your pad.  Use towelettes in place of toilet paper until your stitches have healed.  Do Kegel exercises every day. Kegel exercises help to maintain the muscles that support the vagina, bladder, and bowels. You can do these exercises while you are standing, sitting, or lying down. To do Kegel exercises:  Tighten the muscles of  your abdomen and the muscles that surround your birth canal.  Hold for a few seconds.  Relax.  Repeat until you have done this 5 times in a row.  To prevent hemorrhoids from developing or getting worse:  Drink enough fluid to keep your urine clear or pale yellow.  Avoid straining when having a bowel movement.  Take over-the-counter medicines and stool softeners as told by your health care provider. BREAST CARE  Wear a tight-fitting bra.  Avoid taking over-the-counter pain medicine for breast discomfort.  Apply ice to the breasts to help with discomfort as needed:  Put ice in a plastic bag.  Place a towel between your skin and the bag.  Leave the ice on for 20 minutes or as told by your health care provider. NUTRITION  Eat a well-balanced diet.  Do not try to lose weight quickly by cutting back on calories.  Take your prenatal vitamins until your postpartum checkup or until your health care provider tells you to stop. POSTPARTUM DEPRESSION You may find yourself crying for no apparent reason and unable to cope with all of the changes that come with having a newborn. This mood is called postpartum depression. Postpartum depression happens because your hormone levels change after delivery. If you have postpartum depression, get support from your partner, friends, and family. If the depression does not go away on its own after several weeks, contact your health care provider. BREAST SELF-EXAM Do a breast self-exam each month, at the same time of the month. If you are breastfeeding, check your breasts just after a feeding, when your breasts are less full. If you are breastfeeding and your period has started, check your breasts on day 5, 6, or 7 of your period. Report any lumps, bumps, or discharge to your health care provider. Know that breasts are normally lumpy if you are breastfeeding. This is temporary, and it is not a health risk. INTIMACY AND SEXUALITY Avoid sexual activity  for at least 3-4 weeks after delivery or until the brownish-red vaginal flow is completely gone. If you want to avoid pregnancy, use some form of birth control. You can get pregnant after delivery, even if you have not had your period. SEEK MEDICAL CARE IF:  You feel unable to cope with the changes that a child brings to your life, and these feelings do not go away after several weeks.  You notice a lump, a bump, or discharge on your breast. SEEK IMMEDIATE MEDICAL CARE IF:  Blood soaks your pad in 1 hour or less.  You have:  Severe pain or cramping in your lower abdomen.  A bad-smelling vaginal discharge.  A fever that is not controlled by medicine.  A fever, and an area of your breast is red and sore.  Pain or redness in your calf.  Sudden, severe chest pain.  Shortness of breath.  Painful or bloody urination.  Problems with your vision.  You vomit for 12 hours or longer.  You develop a severe headache.  You have serious thoughts about hurting yourself, your child,  or anyone else. This information is not intended to replace advice given to you by your health care provider. Make sure you discuss any questions you have with your health care provider. Document Released: 10/20/2000 Document Revised: 03/30/2016 Document Reviewed: 04/26/2015 Elsevier Interactive Patient Education  2017 [employer].

## 2017-02-01 ENCOUNTER — Ambulatory Visit: Payer: Self-pay

## 2017-02-01 NOTE — Lactation Note (Signed)
This note was copied from a baby's chart. Lactation Consultation Note Mom being d/c home. Baby is admitted to nursery d/t mom going home d/t child care.  Mom is going to pump and bottle feed. Has not pumped at the hospital. Mom stated she has DEBP at home. Took pump kit home with her.  Discussed supply and demand, post pumping every three hours. Discussed engorgement and management.  Mom has [place].  Patient Name: Amanda Murillo WCHEN'I Date: 02/01/2017 Reason for consult: Follow-up assessment   Maternal Data    Feeding    LATCH Score/Interventions                      Lactation Tools Discussed/Used     Consult Status Consult Status: Complete Date: 02/01/17    Theodoro Kalata 02/01/2017, 5:51 AM

## 2017-02-13 ENCOUNTER — Encounter: Payer: Self-pay | Admitting: Certified Nurse Midwife

## 2017-02-24 ENCOUNTER — Other Ambulatory Visit: Payer: Self-pay | Admitting: Obstetrics & Gynecology

## 2017-03-06 ENCOUNTER — Encounter: Payer: Self-pay | Admitting: Certified Nurse Midwife

## 2017-03-06 ENCOUNTER — Ambulatory Visit (INDEPENDENT_AMBULATORY_CARE_PROVIDER_SITE_OTHER): Payer: Medicaid Other | Admitting: Certified Nurse Midwife

## 2017-03-06 DIAGNOSIS — G5601 Carpal tunnel syndrome, right upper limb: Secondary | ICD-10-CM

## 2017-03-06 DIAGNOSIS — Z3042 Encounter for surveillance of injectable contraceptive: Secondary | ICD-10-CM

## 2017-03-06 NOTE — Progress Notes (Signed)
Subjective:     Amanda Murillo is a 26 y.o. female who presents for a postpartum visit. She is 5 weeks postpartum following a spontaneous vaginal delivery. I have fully reviewed the prenatal and intrapartum course. The delivery was at 37 gestational weeks. Outcome: spontaneous vaginal delivery. Anesthesia: epidural. Postpartum course has been uncomplicated. Baby's course has been uncomplicated. Baby is feeding by breast and bottle. Bleeding no bleeding. Bowel function is normal. Bladder function is normal. Patient is not sexually active. Contraception method is Depo-Provera injections. Postpartum depression screening: negative.  Reports pain and weakness in left hand/wrist since pregnancy, not improved since delivery. The following portions of the patient's history were reviewed and updated as appropriate: allergies, current medications, past family history, past medical history, past social history, past surgical history and problem list.  Review of Systems Pertinent items are noted in HPI.   Objective:    BP 108/67   Pulse 76   Ht 5' (1.524 m)   Wt 164 lb 14.4 oz (74.8 kg)   Breastfeeding? Yes   BMI 32.20 kg/m   General:  alert, cooperative and no distress   Breasts:    Lungs: nml rate and effort  Heart:  nml rate  Abdomen:    Vulva:    Vagina:   Cervix:    Corpus:   Adnexa:    Rectal Exam:         Neuro: grossly nml MSK: nml ROM in right hand, no edema or erythema, +tender with movement  Assessment:      Normal postpartum exam. Pap smear not done at today's visit.   Carpal tunnel syndrome   Plan:    1. Contraception: Depo-Provera injections  2. Wrist splint at hs-can find OTC, Ibuprofen prn  3. Follow up for Depo injections  4. Establish care with PCP-recommend [facility] or [facility]

## 2017-03-16 ENCOUNTER — Emergency Department (HOSPITAL_COMMUNITY)
Admission: EM | Admit: 2017-03-16 | Discharge: 2017-03-16 | Disposition: A | Discharge status: Home / Self Care | Payer: Medicaid Other | Attending: Emergency Medicine | Admitting: Emergency Medicine

## 2017-03-16 ENCOUNTER — Encounter (HOSPITAL_COMMUNITY): Payer: Self-pay | Admitting: *Deleted

## 2017-03-16 DIAGNOSIS — K0889 Other specified disorders of teeth and supporting structures: Secondary | ICD-10-CM

## 2017-03-16 DIAGNOSIS — J45909 Unspecified asthma, uncomplicated: Secondary | ICD-10-CM | POA: Diagnosis not present

## 2017-03-16 MED ORDER — PENICILLIN V POTASSIUM 500 MG PO TABS: 500.0000 mg | ORAL_TABLET | Freq: Four times a day (QID) | ORAL | 0 refills | Status: AC

## 2017-03-16 MED ORDER — IBUPROFEN 800 MG PO TABS: 800.0000 mg | ORAL_TABLET | Freq: Three times a day (TID) | ORAL | 0 refills | Status: DC

## 2017-03-16 NOTE — ED Provider Notes (Signed)
[facility]-EMERGENCY DEPT Provider Note   CSN: [id number] Arrival date & time: 03/16/17  [id number]    By signing my name below, I, Freida Busmaniana Omoyeni, attest that this documentation has been prepared under the direction and in the presence of Buel ReamAlexandra Suzanne Garbers, PA-C. Electronically Signed: Freida Busmaniana Omoyeni, Scribe. 03/16/2017. 9:47 AM.  History   Chief Complaint Chief Complaint  Patient presents with  . Dental Pain    The history is provided by the patient. No language interpreter was used.    HPI Comments:  Amanda Murillo is a 26 y.o. female who presents to the Emergency Department complaining of moderate constant left sided dental pain x 2 days. She has applied orajel and taken acetaminophen 500mg  without relief. She reports chip to rear upper tooth and a filling that came out of the rear lower tooth. Pt denies fever; no associated symptoms noted.   Past Medical History:  Diagnosis Date  . Asthma   . Bronchitis   . Trichomonas infection   . 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 Gastritis and gastroduodenitis 02/08/2015  . Anemia 02/02/2015  . Prolonged QT interval 02/02/2015    Past Surgical History:  Procedure Laterality Date  . NO PAST SURGERIES    . THERAPEUTIC ABORTION      OB History    Gravida Para Term Preterm AB Living   5 4 4  0 1 3   SAB TAB Ectopic Multiple Live Births   0 1 0 0 3       Home Medications    Prior to Admission medications   Medication Sig Start Date End Date Taking? Authorizing Provider  hydrOXYzine (VISTARIL) 50 MG capsule Take 1 capsule (50 mg total) by mouth 3 (three) times daily as needed. Patient not taking: Reported on 03/06/2017 01/17/17   Leftwich-Kirby, Wilmer FloorLisa A, CNM  ibuprofen (ADVIL,MOTRIN) 800 MG tablet Take 1 tablet (800 mg total) by mouth 3 (three) times daily. 03/16/17   Kamariya Blevens, Waylan BogaAlexandra M, PA-C  IRON PO Take 3 tablets by mouth daily. Pt not sure of strength     [provider]  penicillin v potassium (VEETID) 500 MG tablet Take 1 tablet (500 mg total) by mouth 4 (four) times daily. 03/16/17 03/23/17  Emi HolesLaw, Hadyn Blanck M, PA-C  Prenatal Vit-Fe Fumarate-FA (PRENATAL MULTIVITAMIN) TABS tablet Take 1 tablet by mouth daily at 12 noon.    [provider]    Family History Family History  Problem Relation Age of Onset  . Stroke Mother   . Anesthesia problems Neg Hx     Social History Social History  Substance Use Topics  . Smoking status: Never Smoker  . Smokeless tobacco: Never Used  . Alcohol use No     Allergies   Patient has no known allergies.   Review of Systems Review of Systems  Constitutional: Negative for fever.  HENT: Positive for dental problem.      Physical Exam Updated Vital Signs BP 111/60 (BP Location: Right Arm)   Pulse (!) 57   Temp 98.7 F (37.1 C) (Oral)   Resp 16   SpO2 100%   Physical Exam  Constitutional: She appears well-developed and well-nourished. No distress.  HENT:  Head: Normocephalic and atraumatic.  Mouth/Throat: Oropharynx is clear and moist. No trismus in the jaw. Abnormal dentition. No dental abscesses or uvula swelling. No oropharyngeal exudate, posterior oropharyngeal edema or posterior oropharyngeal erythema.    Eyes:  Conjunctivae are normal. Pupils are equal, round, and reactive to light. Right eye exhibits no discharge. Left eye exhibits no discharge. No scleral icterus.  Neck: Normal range of motion. Neck supple. No thyromegaly present.  Cardiovascular: Normal rate, regular rhythm, normal heart sounds and intact distal pulses.  Exam reveals no gallop and no friction rub.   No murmur heard. Pulmonary/Chest: Effort normal and breath sounds normal. No stridor. No respiratory distress. She has no wheezes. She has no rales.  Abdominal: Soft. Bowel sounds are normal. She exhibits no distension. There is no tenderness. There is no rebound and no guarding.  Musculoskeletal: She  exhibits no edema.  Lymphadenopathy:    She has no cervical adenopathy.  Neurological: She is alert. Coordination normal.  Skin: Skin is warm and dry. No rash noted. She is not diaphoretic. No pallor.  Psychiatric: She has a normal mood and affect.  Nursing note and vitals reviewed.    ED Treatments / Results  DIAGNOSTIC STUDIES:  Oxygen Saturation is 100% on RA, normal by my interpretation.    COORDINATION OF CARE:  9:45 AM Discussed treatment plan with pt at bedside and pt agreed to plan.   Labs (all labs ordered are listed, but only abnormal results are displayed) Labs Reviewed - No data to display  EKG  EKG Interpretation None       Radiology No results found.  Procedures Procedures (including critical care time)  Medications Ordered in ED Medications - No data to display   Initial Impression / Assessment and Plan / ED Course  I have reviewed the triage vital signs and the nursing notes.  Pertinent labs & imaging results that were available during my care of the patient were reviewed by me and considered in my medical decision making (see chart for details).       Patient with dentalgia.  No abscess requiring immediate incision and drainage.  Exam not concerning for Ludwig's angina or pharyngeal abscess.  Will treat with PCN, ibuprofen. Pt instructed to follow-up with dentist, given resources.  Discussed return precautions. Pt safe for discharge.  Final Clinical Impressions(s) / ED Diagnoses   Final diagnoses:  Pain, dental    New Prescriptions New Prescriptions   IBUPROFEN (ADVIL,MOTRIN) 800 MG TABLET    Take 1 tablet (800 mg total) by mouth 3 (three) times daily.   PENICILLIN V POTASSIUM (VEETID) 500 MG TABLET    Take 1 tablet (500 mg total) by mouth 4 (four) times daily.   I personally performed the services described in this documentation, which was scribed in my presence. The recorded information has been reviewed and is accurate.     Emi Holes, PA-C 03/16/17 1005    Alvira Monday, MD 03/16/17 2249

## 2017-03-16 NOTE — ED Notes (Signed)
Pt is in stable condition upon d/c and ambulates from ED. 

## 2017-03-16 NOTE — ED Triage Notes (Signed)
Pt has c/o dental pain to both the top and bottom molars on the left side. The bottom has lost a filling and the top is cracked. Pt states she has no dentist and is having a hard time finding one that accepts medicaid.

## 2017-03-16 NOTE — Discharge Instructions (Signed)
Medications: Penicillin, ibuprofen  Treatment: Take penicillin 4 times daily for 7 days. Make sure to finish all this medication. Take ibuprofen every 8 hours as needed for your pain. You can alternate with acetaminophen as you have been taking over-the-counter every 4-6 hours.  Follow-up: Please follow-up with a dentist as soon as possible. See the resource guide at the end of this packet.You can also follow-up with Dr. Leanord AsalFarless. If you call today to make an appointment. Make sure to bring your discharge paperwork from today's visit and [facility] will help pay for your visit.

## 2017-04-18 ENCOUNTER — Ambulatory Visit: Payer: Medicaid Other

## 2018-01-28 IMAGING — US US MFM FETAL BPP W/O NON-STRESS
1 series · 12 of 12 positions shown · non-contrast
Comparison: none

[Series 1: us mfm fetal bpp w/o non-stress · 12 acquisitions, 12 frames shown]
[im 1/12]
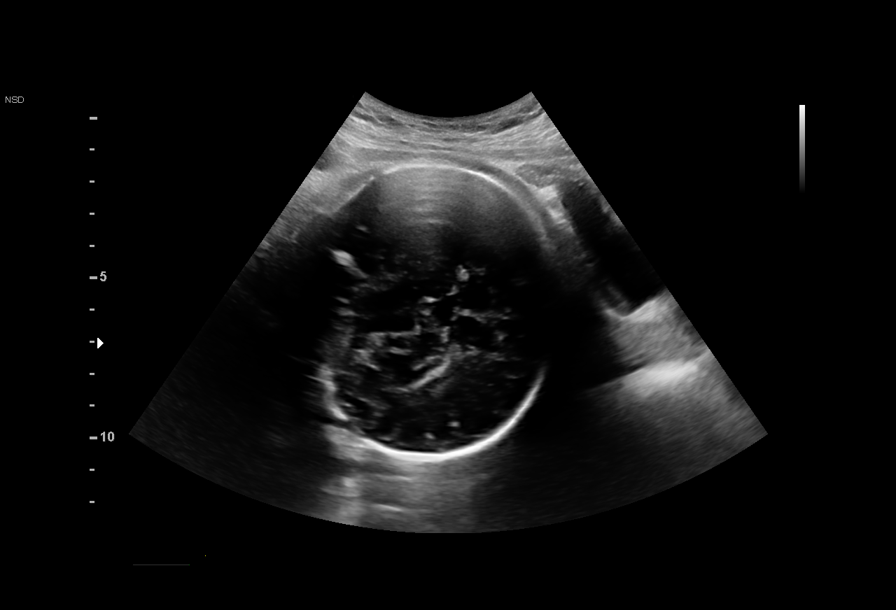
[im 2/12]
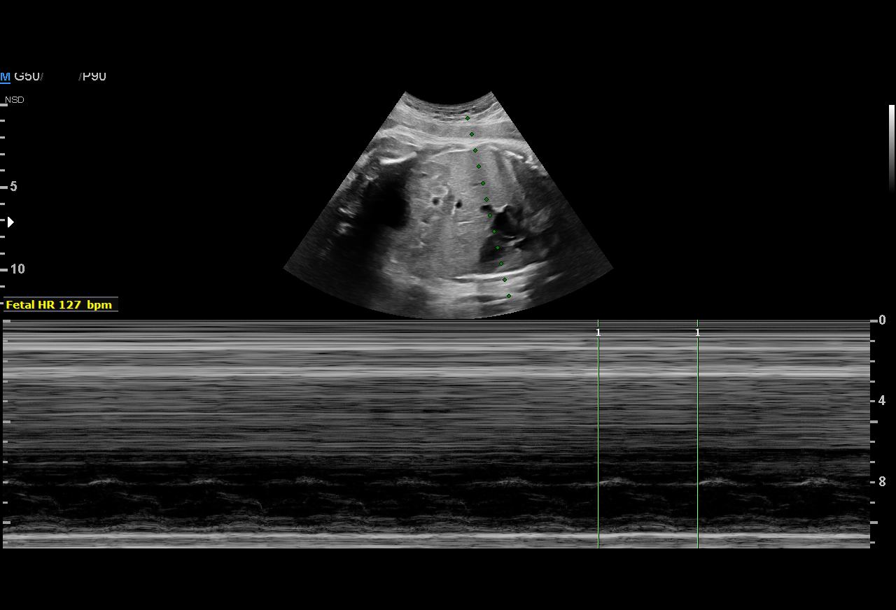
[im 3/12]
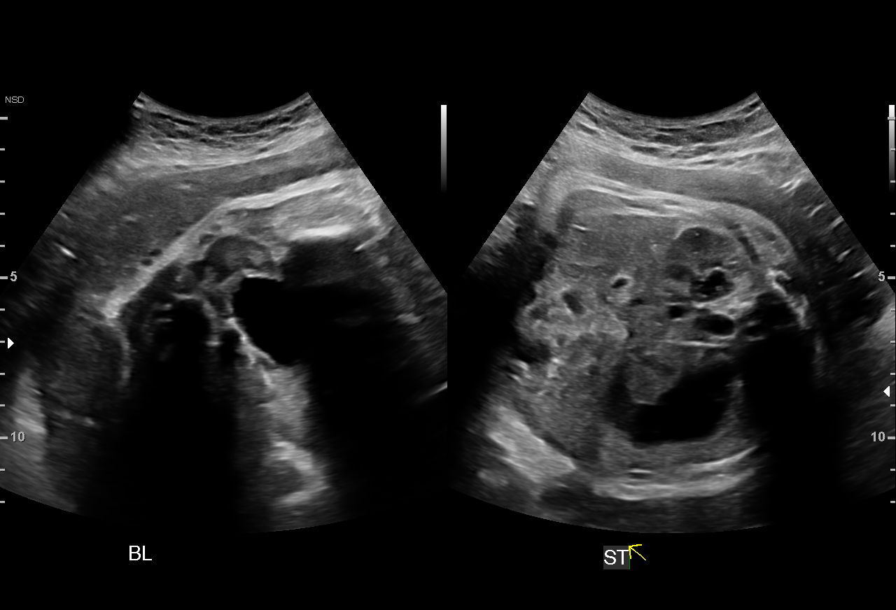
[im 4/12]
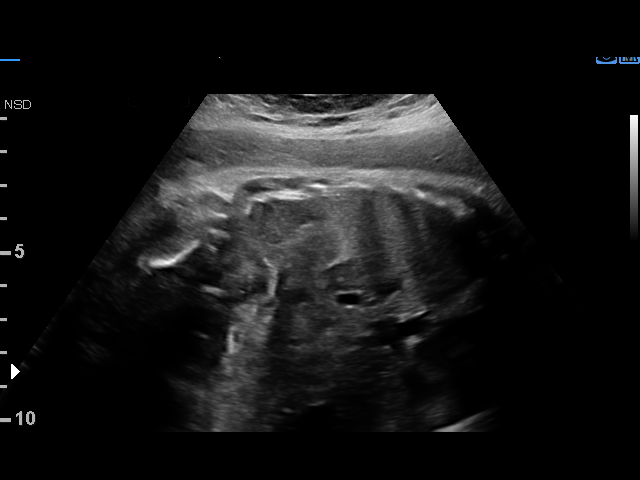
[im 5/12]
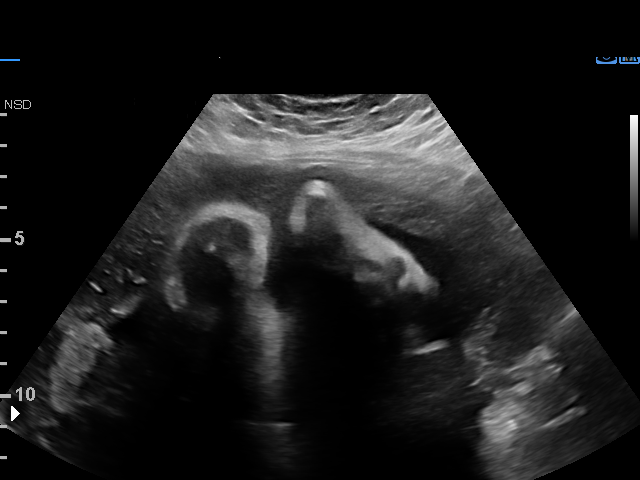
[im 6/12]
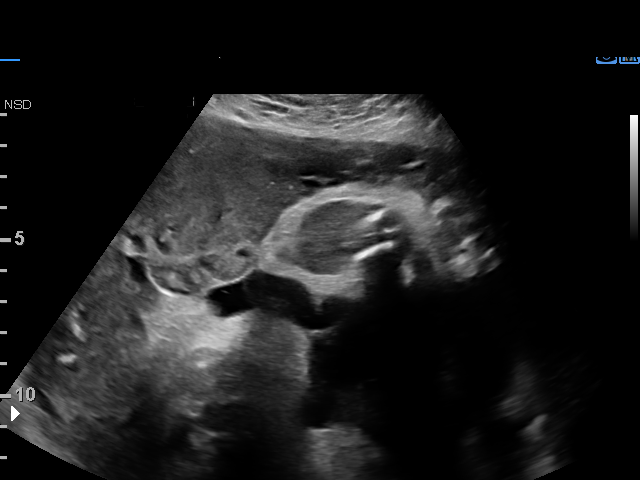
[im 7/12]
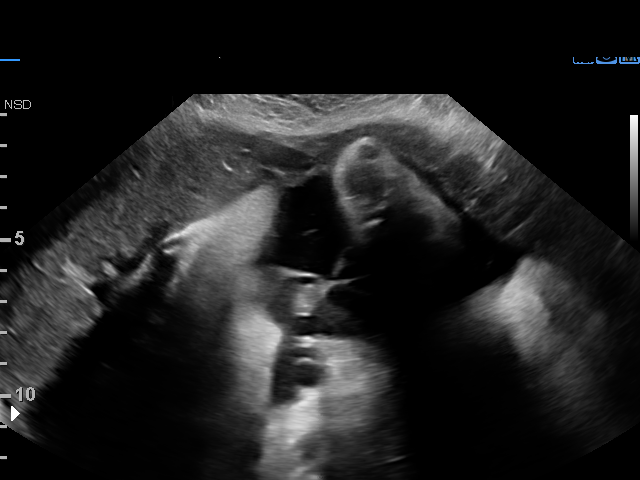
[im 8/12]
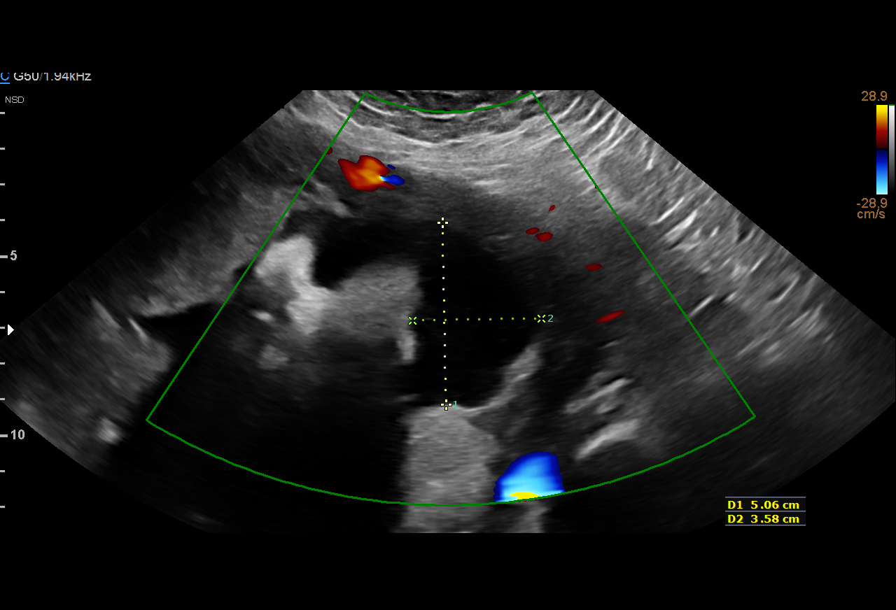
[im 9/12]
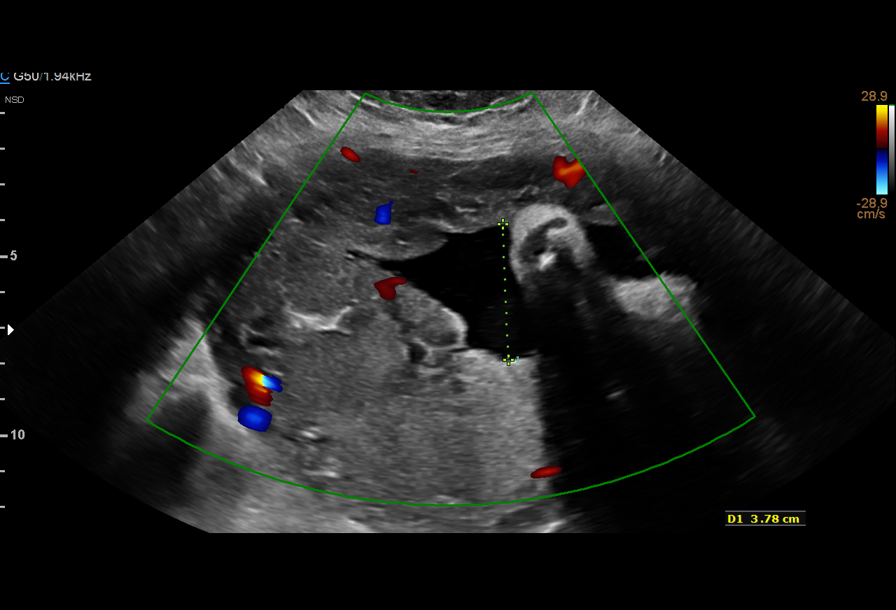
[im 10/12]
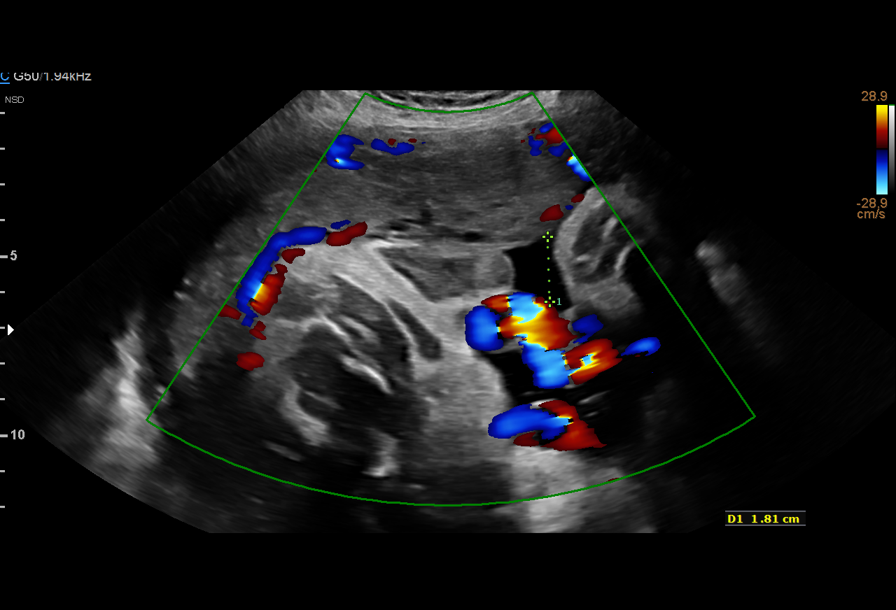
[im 11/12]
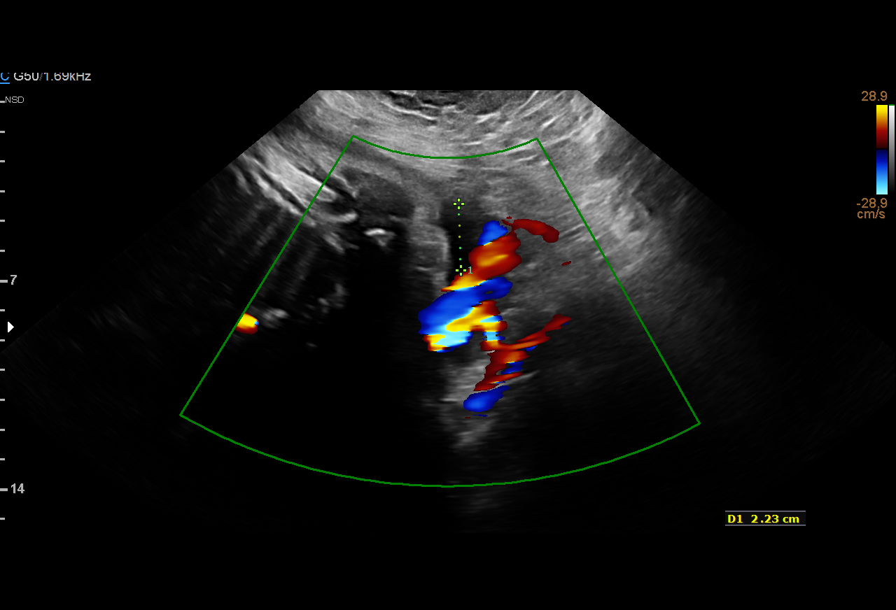
[im 12/12]
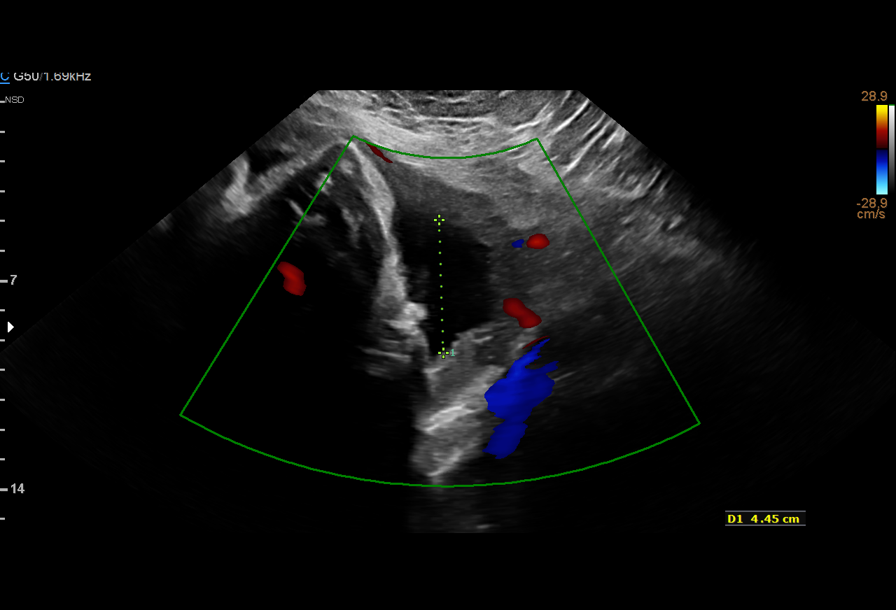

[12 of 12 positions shown; findings below may reference images not displayed]

Attending:        Lhi Yunx Sabello        Secondary Phy.:   [facility]-
MAU/Triage

Indications

35 weeks gestation of pregnancy
Cholestasis of pregnancy, third trimester      [id number].D
Poor obstetric history: Previous IUFD
(Trisomy 18)
OB History

Gravidity:    4         Term:   3        Prem:   0        SAB:   0
TOP:          0       Ectopic:  0        Living: 2
Fetal Evaluation

Num Of Fetuses:     1
Fetal Heart         127
Rate(bpm):
Cardiac Activity:   Observed
Presentation:       Cephalic

Amniotic Fluid
AFI FV:      Subjectively within normal limits

AFI Sum(cm)     %Tile       Largest Pocket(cm)
10.2            22

RUQ(cm)       RLQ(cm)       LUQ(cm)        LLQ(cm)
3.38
Biophysical Evaluation

Amniotic F.V:   Pocket => 2 cm two         F. Tone:        Observed
planes
F. Movement:    Observed                   Score:          [DATE]
F. Breathing:   Observed
Gestational Age

LMP:           35w 2d       Date:   05/15/16                 EDD:   02/19/17
Best:          35w 2d    Det. By:   LMP  (05/15/16)          EDD:   02/19/17
Impression

Single IUP at 35w 2d
Cephalic presentation
BPP [DATE]
Normal amniotic fluid volume
Recommendations

Continue antenatal testing as previously scheduled

## 2018-10-25 ENCOUNTER — Ambulatory Visit (HOSPITAL_COMMUNITY)
Admission: EM | Admit: 2018-10-25 | Discharge: 2018-10-25 | Disposition: A | Discharge status: Home / Self Care | Payer: Medicaid Other | Attending: Internal Medicine | Admitting: Internal Medicine

## 2018-10-25 ENCOUNTER — Encounter (HOSPITAL_COMMUNITY): Payer: Self-pay | Admitting: Emergency Medicine

## 2018-10-25 DIAGNOSIS — R0789 Other chest pain: Secondary | ICD-10-CM

## 2018-10-25 DIAGNOSIS — N926 Irregular menstruation, unspecified: Secondary | ICD-10-CM | POA: Diagnosis not present

## 2018-10-25 LAB — POCT PREGNANCY, URINE: Preg Test, Ur: NEGATIVE

## 2018-10-25 NOTE — ED Triage Notes (Signed)
Pt c/o intermittent CP onset "couple of weeks"  Denies SOB, dyspnea, diaphoresis, nauseas, HA  Sts she can not reproduce pain w/activity   Denies cardiac hx, smoke, drugs  Hx of asthma  A&O x4... NAD.Amanda Murillo.. ambulatory

## 2018-10-25 NOTE — ED Provider Notes (Signed)
[facility]    CSN: [id number] Arrival date & time: 10/25/18  [id number]     History   Chief Complaint Chief Complaint  Patient presents with  . Chest Pain    HPI Amanda Murillo is a 27 y.o. female.   27 year old female presents with "achy" feeling in her chest for the past 2 to 3 weeks. Does not recall when it started but entire chest will ache for a few seconds and then resolve. Denies any difficulty breathing, pain with breathing, nausea, vomiting, headache, diaphoresis, or back pain. Has noticed her right breast is more achy and "leaking" discharge from her nipple over the past 2 to 3 months. Her son is currently 2819 months old and no longer breast feeding. LMP a few weeks ago but has irregular periods. Has taken 2 home pregnancy tests- first one was inconclusive and second was negative. Requests to recheck urine pregnancy test today.  Patient's is a poor historian and her time frame is vague. Has history of asthma but does not currently have an inhaler. No other chronic health issues. Takes no daily medicaiton.   The history is provided by the patient.    Past Medical History:  Diagnosis Date  . Asthma   . Bronchitis   . Trichomonas infection   . 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 Gastritis and gastroduodenitis 02/08/2015  . Anemia 02/02/2015  . Prolonged QT interval 02/02/2015    Past Surgical History:  Procedure Laterality Date  . NO PAST SURGERIES    . THERAPEUTIC ABORTION      OB History    Gravida  5   Para  4   Term  4   Preterm  0   AB  1   Living  3     SAB  0   TAB  1   Ectopic  0   Multiple  0   Live Births  3            Home Medications    Prior to Admission medications   Not on File    Family History Family History  Problem Relation Age of Onset  . Stroke Mother   . Anesthesia problems Neg Hx     Social History Social History    Tobacco Use  . Smoking status: Never Smoker  . Smokeless tobacco: Never Used  Substance Use Topics  . Alcohol use: No  . Drug use: No     Allergies   Patient has no known allergies.   Review of Systems Review of Systems  Constitutional: Negative for activity change, appetite change, chills, diaphoresis, fatigue and fever.  HENT: Negative for congestion, facial swelling, mouth sores, rhinorrhea, sore throat and trouble swallowing.   Eyes: Negative for photophobia and visual disturbance.  Respiratory: Negative for cough, chest tightness, shortness of breath and wheezing.   Cardiovascular: Positive for chest pain. Negative for palpitations.  Gastrointestinal: Negative for abdominal pain, constipation, diarrhea, nausea and vomiting.  Genitourinary: Negative for decreased urine volume, difficulty urinating and flank pain.  Musculoskeletal: Positive for myalgias. Negative for arthralgias, back pain, neck pain and neck stiffness.  Skin: Negative for color change, rash and wound.  Neurological: Negative for dizziness, tremors, seizures, syncope, speech difficulty, weakness, light-headedness, numbness and headaches.  Hematological: Negative for adenopathy. Does not bruise/bleed easily.     Physical Exam Triage Vital Signs ED Triage  Vitals  Enc Vitals Group     BP 10/25/18 0859 103/61     Pulse Rate 10/25/18 0859 66     Resp 10/25/18 0859 20     Temp 10/25/18 0859 98.9 F (37.2 C)     Temp Source 10/25/18 0859 Oral     SpO2 10/25/18 0859 98 %     Weight --      Height --      Head Circumference --      Peak Flow --      Pain Score 10/25/18 0901 4     Pain Loc --      Pain Edu? --      Excl. in GC? --    No data found.  Updated Vital Signs BP 103/61 (BP Location: Left Arm)   Pulse 66   Temp 98.9 F (37.2 C) (Oral)   Resp 20   LMP 10/10/2018   SpO2 98%   Breastfeeding No   Visual Acuity Right Eye Distance:   Left Eye Distance:   Bilateral Distance:    Right  Eye Near:   Left Eye Near:    Bilateral Near:     Physical Exam Vitals signs and nursing note reviewed.  Constitutional:      General: She is not in acute distress.    Appearance: Normal appearance. She is well-developed and overweight. She is not ill-appearing.     Comments: Patient sitting comfortably on exam table in no acute distress.   HENT:     Head: Normocephalic and atraumatic.     Right Ear: Hearing, tympanic membrane, ear canal and external ear normal.     Left Ear: Hearing, tympanic membrane, ear canal and external ear normal.     Nose: Nose normal.     Mouth/Throat:     Lips: Pink.     Mouth: Mucous membranes are moist.     Pharynx: Oropharynx is clear.  Eyes:     General: Lids are normal.     Extraocular Movements: Extraocular movements intact.     Conjunctiva/sclera: Conjunctivae normal.  Neck:     Musculoskeletal: Normal range of motion and neck supple.  Cardiovascular:     Rate and Rhythm: Normal rate and regular rhythm.     Pulses: Normal pulses.     Heart sounds: Normal heart sounds. No murmur.  Pulmonary:     Effort: Pulmonary effort is normal. No respiratory distress.     Breath sounds: Normal breath sounds. No stridor. No wheezing or rhonchi.  Chest:     Chest wall: No mass, swelling or tenderness.     Breasts: Breasts are symmetrical.        Right: Nipple discharge (Patient appeard to express very small amount of clear discharge) present. No swelling, bleeding, inverted nipple, skin change or tenderness.   Musculoskeletal: Normal range of motion.  Lymphadenopathy:     Cervical: No cervical adenopathy.  Skin:    General: Skin is warm and dry.     Findings: No erythema or rash.  Neurological:     General: No focal deficit present.     Mental Status: She is alert and oriented to person, place, and time.  Psychiatric:        Attention and Perception: She is inattentive.        Mood and Affect: Mood and affect normal.        Speech: Speech normal.          Behavior: Behavior normal. Behavior is  cooperative.      UC Treatments / Results  Labs (all labs ordered are listed, but only abnormal results are displayed) Labs Reviewed  POC URINE PREG, ED  POCT PREGNANCY, URINE    EKG None  Radiology No results found.  Procedures Procedures (including critical care time)  Medications Ordered in UC Medications - No data to display  Initial Impression / Assessment and Plan / UC Course  I have reviewed the triage vital signs and the nursing notes.  Pertinent labs & imaging results that were available during my care of the patient were reviewed by me and considered in my medical decision making (see chart for details).    Reviewed negative urine pregnancy test with patient. Discussed uncertain of etiology of chest pain- possibly musculoskeletal. Doubt cardiac. Offered NSAID but patient declined. May take OTC Aleve 1 tablet every 12 hours as needed. Had appointment with [facility] in [facility] today for evaluation but patient came here instead. Discussed that she needs further work-up, especially of any unusual nipple discharge. Recommend call the Health Department today to reschedule appointment ASAP or go to the ER if pain worsens.  Final Clinical Impressions(s) / UC Diagnoses   Final diagnoses:  Other chest pain  Irregular periods     Discharge Instructions     Recommend trial OTC Aleve 1 tablet every 12 hours as needed for pain. Continue to monitor symptoms. With recent right breast soreness and slight discharge, recommend follow-up with the Health Department or GYN as planned (reschedule appointment ASAP for visit).     ED Prescriptions    None     Controlled Substance Prescriptions [place] Controlled Substance Registry consulted? Not Applicable   Sudie Grumblingmyot, Fredda Clarida Berry, NP 10/25/18 2146

## 2018-10-25 NOTE — Discharge Instructions (Signed)
Recommend trial OTC Aleve 1 tablet every 12 hours as needed for pain. Continue to monitor symptoms. With recent right breast soreness and slight discharge, recommend follow-up with the Health Department or GYN as planned (reschedule appointment ASAP for visit).

## 2018-12-23 ENCOUNTER — Ambulatory Visit (HOSPITAL_COMMUNITY)
Admission: EM | Admit: 2018-12-23 | Discharge: 2018-12-23 | Disposition: A | Discharge status: Home / Self Care | Payer: Medicaid Other | Attending: Urgent Care | Admitting: Urgent Care

## 2018-12-23 ENCOUNTER — Encounter (HOSPITAL_COMMUNITY): Payer: Self-pay | Admitting: Emergency Medicine

## 2018-12-23 DIAGNOSIS — N898 Other specified noninflammatory disorders of vagina: Secondary | ICD-10-CM | POA: Diagnosis not present

## 2018-12-23 DIAGNOSIS — R1030 Lower abdominal pain, unspecified: Secondary | ICD-10-CM | POA: Diagnosis present

## 2018-12-23 LAB — POCT URINALYSIS DIP (DEVICE)
Glucose, UA: NEGATIVE mg/dL
HGB URINE DIPSTICK: NEGATIVE
Leukocytes,Ua: NEGATIVE
Nitrite: NEGATIVE
Protein, ur: 30 mg/dL — AB
Urobilinogen, UA: 1 mg/dL (ref 0.0–1.0)
pH: 6 (ref 5.0–8.0)

## 2018-12-23 LAB — POCT PREGNANCY, URINE: Preg Test, Ur: NEGATIVE

## 2018-12-23 MED ORDER — AZITHROMYCIN 250 MG PO TABS
ORAL_TABLET | ORAL | Status: AC
  Filled 2018-12-23: qty 4

## 2018-12-23 MED ORDER — LIDOCAINE HCL 2 % IJ SOLN
INTRAMUSCULAR | Status: AC
  Filled 2018-12-23: qty 20

## 2018-12-23 MED ORDER — CEFTRIAXONE SODIUM 250 MG IJ SOLR
INTRAMUSCULAR | Status: AC
  Filled 2018-12-23: qty 250

## 2018-12-23 MED ORDER — AZITHROMYCIN 250 MG PO TABS
1000.0000 mg | ORAL_TABLET | Freq: Once | ORAL | Status: AC
  Administered 2018-12-23: 1000 mg via ORAL

## 2018-12-23 MED ORDER — CEFTRIAXONE SODIUM 250 MG IJ SOLR
250.0000 mg | Freq: Once | INTRAMUSCULAR | Status: AC
  Administered 2018-12-23: 250 mg via INTRAMUSCULAR

## 2018-12-23 NOTE — ED Provider Notes (Signed)
MRN: [id number] DOB: Jan 12, 1991  Subjective:   Amanda Murillo is a 28 y.o. female presenting for several day history of mild to moderate, intermittent lower abdominal pain and abnormal vaginal discharge.  Patient is sexually active, does not use condoms for protection.  Denies fever, dysuria, hematuria, urinary frequency, urinary urgency, flank pain and genital rash, nausea and vomiting.  Denies any history of abnormal heart rhythms, QT prolongation despite that it is in her medical record.   She is not currently taking any medications.   No Known Allergies   Past Medical History:  Diagnosis Date  . Asthma   . Bronchitis   . Trichomonas infection   . Trisomy 18 in child of prior pregnancy, currently pregnant      Past Surgical History:  Procedure Laterality Date  . NO PAST SURGERIES    . THERAPEUTIC ABORTION      ROS  Objective:   Vitals: BP 97/64 (BP Location: Left Arm)   Pulse 71   Temp 97.9 F (36.6 C) (Oral)   Resp 18   LMP 10/10/2018   SpO2 100%   Physical Exam Constitutional:      General: She is not in acute distress.    Appearance: Normal appearance. She is well-developed. She is not ill-appearing.  HENT:     Head: Normocephalic and atraumatic.     Nose: Nose normal.     Mouth/Throat:     Mouth: Mucous membranes are moist.     Pharynx: Oropharynx is clear.  Eyes:     General: No scleral icterus.    Extraocular Movements: Extraocular movements intact.     Pupils: Pupils are equal, round, and reactive to light.  Cardiovascular:     Rate and Rhythm: Normal rate.  Pulmonary:     Effort: Pulmonary effort is normal.  Skin:    General: Skin is warm and dry.  Neurological:     General: No focal deficit present.     Mental Status: She is alert and oriented to person, place, and time.  Psychiatric:        Mood and Affect: Mood normal.        Behavior: Behavior normal.     Results for orders placed or performed during the hospital encounter of  12/23/18 (from the past 24 hour(s))  POCT urinalysis dip (device)     Status: Abnormal   Collection Time: 12/23/18  4:25 PM  Result Value Ref Range   Glucose, UA NEGATIVE NEGATIVE mg/dL   Bilirubin Urine SMALL (A) NEGATIVE   Ketones, ur TRACE (A) NEGATIVE mg/dL   Specific Gravity, Urine >=1.030 1.005 - 1.030   Hgb urine dipstick NEGATIVE NEGATIVE   pH 6.0 5.0 - 8.0   Protein, ur 30 (A) NEGATIVE mg/dL   Urobilinogen, UA 1.0 0.0 - 1.0 mg/dL   Nitrite NEGATIVE NEGATIVE   Leukocytes,Ua NEGATIVE NEGATIVE  Pregnancy, urine POC     Status: None   Collection Time: 12/23/18  4:33 PM  Result Value Ref Range   Preg Test, Ur NEGATIVE NEGATIVE    Assessment and Plan :   Vaginal discharge  Lower abdominal pain  Will cover patient for STI with ceftriaxone and azithromycin.  Counseled patient on risk of QT prolongation with macrolide.  She adamantly denies any heart trouble or history of QT prolongation.  Labs pending. Counseled patient on potential for adverse effects with medications prescribed today, patient verbalized understanding. ER and return-to-clinic precautions discussed, patient verbalized understanding.    Wallis Bamberg, PA-C 12/23/18  1700  

## 2018-12-23 NOTE — ED Triage Notes (Signed)
Pt here for lower abd pain and vaginal discharge

## 2018-12-24 LAB — CERVICOVAGINAL ANCILLARY ONLY
BACTERIAL VAGINITIS: POSITIVE — AB
CANDIDA VAGINITIS: NEGATIVE
CHLAMYDIA, DNA PROBE: NEGATIVE
NEISSERIA GONORRHEA: POSITIVE — AB
TRICH (WINDOWPATH): NEGATIVE

## 2018-12-26 ENCOUNTER — Telehealth (HOSPITAL_COMMUNITY): Payer: Self-pay | Admitting: Emergency Medicine

## 2018-12-26 MED ORDER — METRONIDAZOLE 500 MG PO TABS: 500.0000 mg | ORAL_TABLET | Freq: Two times a day (BID) | ORAL | 0 refills | Status: DC

## 2018-12-26 NOTE — Telephone Encounter (Signed)
Test for gonorrhea was positive. This was treated at the urgent care visit with IM rocephin 250mg  and po zithromax 1g. Pt needs education to refrain from sexual intercourse for 7 days after treatment to give the medicine time to work. Sexual partners need to be notified and tested/treated. Condoms may reduce risk of reinfection. Recheck or followup with PCP for further evaluation if symptoms are not improving. [facility] notified.   Bacterial vaginosis is positive. This was not treated at the urgent care visit.  Flagyl 500 mg BID x 7 days #14 no refills sent to patients pharmacy of choice.    Spoke with pt verbalized understanding

## 2019-05-29 ENCOUNTER — Encounter (HOSPITAL_COMMUNITY): Payer: Self-pay | Admitting: Emergency Medicine

## 2019-05-29 ENCOUNTER — Ambulatory Visit (HOSPITAL_COMMUNITY)
Admission: EM | Admit: 2019-05-29 | Discharge: 2019-05-29 | Disposition: A | Discharge status: Home / Self Care | Payer: Medicaid Other | Attending: Urgent Care | Admitting: Urgent Care

## 2019-05-29 ENCOUNTER — Other Ambulatory Visit: Payer: Self-pay

## 2019-05-29 DIAGNOSIS — L02411 Cutaneous abscess of right axilla: Secondary | ICD-10-CM | POA: Insufficient documentation

## 2019-05-29 DIAGNOSIS — Z20828 Contact with and (suspected) exposure to other viral communicable diseases: Secondary | ICD-10-CM | POA: Diagnosis not present

## 2019-05-29 DIAGNOSIS — B349 Viral infection, unspecified: Secondary | ICD-10-CM | POA: Diagnosis not present

## 2019-05-29 DIAGNOSIS — J029 Acute pharyngitis, unspecified: Secondary | ICD-10-CM | POA: Diagnosis present

## 2019-05-29 DIAGNOSIS — Z872 Personal history of diseases of the skin and subcutaneous tissue: Secondary | ICD-10-CM | POA: Diagnosis not present

## 2019-05-29 LAB — POCT RAPID STREP A: Streptococcus, Group A Screen (Direct): NEGATIVE

## 2019-05-29 MED ORDER — CEPHALEXIN 500 MG PO CAPS: 500.0000 mg | ORAL_CAPSULE | Freq: Three times a day (TID) | ORAL | 0 refills | Status: DC

## 2019-05-29 NOTE — ED Triage Notes (Signed)
PT developed a dry, sore throat overnight. Also has a previously lanced abcess she wants to be checked

## 2019-05-29 NOTE — ED Provider Notes (Signed)
MRN: [id number] DOB: 07/08/1991  Subjective:   Amanda Murillo is a 28 y.o. female presenting for 1 day history of mild intermittent throat pain.  Patient symptoms started last night.  She would like to make sure she does not have COVID-19.  Patient also reports history of axillary abscess, had I&D performed years ago.  In the past couple of weeks patient reports recurrence including pain, mild swelling and drainage of pus.  She is currently taking Flagyl.  No other chronic medications.   No current facility-administered medications for this encounter.   Current Outpatient Medications:  .  metroNIDAZOLE (FLAGYL) 500 MG tablet, Take 1 tablet (500 mg total) by mouth 2 (two) times daily., Disp: 14 tablet, Rfl: 0    No Known Allergies   Past Medical History:  Diagnosis Date  . Asthma   . Bronchitis   . Trichomonas infection   . Trisomy 18 in child of prior pregnancy, currently pregnant      Past Surgical History:  Procedure Laterality Date  . NO PAST SURGERIES    . THERAPEUTIC ABORTION      Review of Systems  Constitutional: Negative for fever and malaise/fatigue.  HENT: Positive for sore throat. Negative for congestion, ear pain and sinus pain.   Eyes: Negative for blurred vision, double vision, discharge and redness.  Respiratory: Positive for cough (Mild occasional). Negative for hemoptysis, shortness of breath and wheezing.   Cardiovascular: Negative for chest pain.  Gastrointestinal: Negative for abdominal pain, diarrhea, nausea and vomiting.  Genitourinary: Negative for dysuria, flank pain and hematuria.  Musculoskeletal: Negative for myalgias.  Skin: Negative for rash.  Neurological: Negative for dizziness, weakness and headaches.  Psychiatric/Behavioral: Negative for depression and substance abuse.    Objective:   Vitals: BP 103/63   Pulse 65   Temp 97.6 F (36.4 C) (Temporal)   Resp 16   LMP 05/06/2019   SpO2 100%   Physical Exam Constitutional:    General: She is not in acute distress.    Appearance: Normal appearance. She is well-developed. She is not ill-appearing, toxic-appearing or diaphoretic.  HENT:     Head: Normocephalic and atraumatic.     Right Ear: Tympanic membrane and ear canal normal. No drainage or tenderness. No middle ear effusion. Tympanic membrane is not erythematous.     Left Ear: Tympanic membrane and ear canal normal. No drainage or tenderness.  No middle ear effusion. Tympanic membrane is not erythematous.     Nose: Nose normal. No congestion or rhinorrhea.     Mouth/Throat:     Mouth: Mucous membranes are moist. No oral lesions.     Pharynx: Oropharynx is clear. No pharyngeal swelling, oropharyngeal exudate, posterior oropharyngeal erythema or uvula swelling.     Tonsils: No tonsillar exudate or tonsillar abscesses.  Eyes:     Extraocular Movements: Extraocular movements intact.     Right eye: Normal extraocular motion.     Left eye: Normal extraocular motion.     Conjunctiva/sclera: Conjunctivae normal.     Pupils: Pupils are equal, round, and reactive to light.  Neck:     Musculoskeletal: Normal range of motion and neck supple.  Cardiovascular:     Rate and Rhythm: Normal rate and regular rhythm.     Pulses: Normal pulses.     Heart sounds: Normal heart sounds. No murmur. No friction rub. No gallop.   Pulmonary:     Effort: Pulmonary effort is normal. No respiratory distress.     Breath sounds: Normal breath  sounds. No stridor. No wheezing, rhonchi or rales.  Lymphadenopathy:     Cervical: No cervical adenopathy.  Skin:    General: Skin is warm and dry.     Findings: No rash.  Neurological:     General: No focal deficit present.     Mental Status: She is alert and oriented to person, place, and time.  Psychiatric:        Mood and Affect: Mood normal.        Behavior: Behavior normal.        Thought Content: Thought content normal.     Results for orders placed or performed during the hospital  encounter of 05/29/19 (from the past 24 hour(s))  POCT rapid strep A [facility])     Status: None   Collection Time: 05/29/19  1:04 PM  Result Value Ref Range   Streptococcus, Group A Screen (Direct) NEGATIVE NEGATIVE    Assessment and Plan :   1. Viral syndrome   2. Sore throat   3. History of abscess of skin and subcutaneous tissue   4. Abscess of axilla, right     Likely viral in etiology. Counseled patient on nature of COVID-19 including modes of transmission, diagnostic testing, management and supportive care.  Offered symptomatic relief. COVID 19 testing is pending.  Wound care reviewed for her abscess, start Keflex.  Counseled patient on potential for adverse effects with medications prescribed/recommended today, ER and return-to-clinic precautions discussed, patient verbalized understanding.     Jaynee Eagles, PA-C 05/29/19 1332

## 2019-05-29 NOTE — Discharge Instructions (Addendum)
Use warm compresses 3-5 times daily, ~10 minutes each time. Change your dressing at least twice daily. If your wound worsen such as more pain, redness, more swelling, fevers then please return to our clinic as we may need to open your wound more or simply do a general recheck.   We will manage this as a viral syndrome. For sore throat or cough try using a honey-based tea. Use 3 teaspoons of honey with juice squeezed from half lemon. Place shaved pieces of ginger into 1/2-1 cup of water and warm over stove top. Then mix the ingredients and repeat every 4 hours as needed. Please take Tylenol 500mg  every 6 hours. Hydrate very well with at least 2 liters of water. Eat light meals such as soups to replenish electrolytes and soft fruits, veggies. Start an antihistamine like Zyrtec, Allegra or Claritin for postnasal drainage, sinus congestion.  You can take this together with pseudoephedrine (Sudafed) at a dose of 60 mg 3 times a day oral 120 mg twice daily as needed for the same kind of congestion.  Make sure you ask the pharmacist for this as you have to show your license to obtain the medication. However, do not take Sudafed if you have high blood pressure or are prone to palpitations, have abnormal heart rhythms.

## 2019-06-01 LAB — NOVEL CORONAVIRUS, NAA (HOSP ORDER, SEND-OUT TO REF LAB; TAT 18-24 HRS): SARS-CoV-2, NAA: NOT DETECTED

## 2019-06-01 LAB — CULTURE, GROUP A STREP (THRC)

## 2019-06-02 ENCOUNTER — Encounter (HOSPITAL_COMMUNITY): Payer: Self-pay

## 2020-07-30 ENCOUNTER — Other Ambulatory Visit: Payer: Medicaid Other

## 2020-07-30 ENCOUNTER — Other Ambulatory Visit: Payer: Self-pay

## 2020-07-30 DIAGNOSIS — Z20822 Contact with and (suspected) exposure to covid-19: Secondary | ICD-10-CM

## 2020-08-01 LAB — NOVEL CORONAVIRUS, NAA: SARS-CoV-2, NAA: NOT DETECTED

## 2020-08-01 LAB — SARS-COV-2, NAA 2 DAY TAT

## 2021-02-03 ENCOUNTER — Ambulatory Visit (HOSPITAL_COMMUNITY)
Admission: EM | Admit: 2021-02-03 | Discharge: 2021-02-03 | Disposition: A | Discharge status: Home / Self Care | Payer: Medicaid Other | Attending: Emergency Medicine | Admitting: Emergency Medicine

## 2021-02-03 ENCOUNTER — Other Ambulatory Visit: Payer: Self-pay

## 2021-02-03 ENCOUNTER — Encounter (HOSPITAL_COMMUNITY): Payer: Self-pay

## 2021-02-03 DIAGNOSIS — J3489 Other specified disorders of nose and nasal sinuses: Secondary | ICD-10-CM | POA: Insufficient documentation

## 2021-02-03 DIAGNOSIS — Z79899 Other long term (current) drug therapy: Secondary | ICD-10-CM | POA: Diagnosis not present

## 2021-02-03 DIAGNOSIS — J029 Acute pharyngitis, unspecified: Secondary | ICD-10-CM | POA: Insufficient documentation

## 2021-02-03 DIAGNOSIS — Z20822 Contact with and (suspected) exposure to covid-19: Secondary | ICD-10-CM | POA: Insufficient documentation

## 2021-02-03 DIAGNOSIS — R590 Localized enlarged lymph nodes: Secondary | ICD-10-CM

## 2021-02-03 DIAGNOSIS — H9201 Otalgia, right ear: Secondary | ICD-10-CM | POA: Diagnosis present

## 2021-02-03 DIAGNOSIS — H9209 Otalgia, unspecified ear: Secondary | ICD-10-CM | POA: Diagnosis not present

## 2021-02-03 LAB — SARS CORONAVIRUS 2 (TAT 6-24 HRS): SARS Coronavirus 2: NEGATIVE

## 2021-02-03 LAB — POC INFLUENZA A AND B ANTIGEN (URGENT CARE ONLY)
Influenza A Ag: NEGATIVE
Influenza B Ag: NEGATIVE

## 2021-02-03 MED ORDER — FLUTICASONE PROPIONATE 50 MCG/ACT NA SUSP: 2.0000 | Freq: Every day | NASAL | 2 refills | Status: DC

## 2021-02-03 MED ORDER — PHENOL 1.4 % MT LIQD: 1.0000 | OROMUCOSAL | 0 refills | Status: DC | PRN

## 2021-02-03 NOTE — ED Provider Notes (Signed)
[facility]    CSN: [id number] Arrival date & time: 02/03/21  8299      History   Chief Complaint Chief Complaint  Patient presents with  . Otalgia  . Swollen Gland    HPI Amanda Murillo is a 30 y.o. female.   Patient presents with sore throat and right ear pain starting 1 day ago. Painful to swallow but able to tolerate food and liquids. Feels like lump siting throat. Ear pain began overnight.  Denies fevers, chills, body aches, decreased hearing, ear discharge, congestion, rhinorrhea, headaches, sinus pressure, dental problems. Thinks she has seasonal allergies, never formally diagnosed.  Past Medical History:  Diagnosis Date  . Asthma   . Bronchitis   . Trichomonas infection   . 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 Gastritis and gastroduodenitis 02/08/2015  . Anemia 02/02/2015  . Prolonged QT interval 02/02/2015    Past Surgical History:  Procedure Laterality Date  . NO PAST SURGERIES    . THERAPEUTIC ABORTION      OB History    Gravida  5   Para  4   Term  4   Preterm  0   AB  1   Living  3     SAB  0   IAB  1   Ectopic  0   Multiple  0   Live Births  3            Home Medications    Prior to Admission medications   Medication Sig Start Date End Date Taking? Authorizing Provider  fluticasone (FLONASE) 50 MCG/ACT nasal spray Place 2 sprays into both nostrils daily. 02/03/21  Yes Bunny Lowdermilk R, NP  phenol (CHLORASEPTIC) 1.4 % LIQD Use as directed 1 spray in the mouth or throat as needed for throat irritation / pain. 02/03/21  Yes Maeven Mcdougall R, NP  cephALEXin (KEFLEX) 500 MG capsule Take 1 capsule (500 mg total) by mouth 3 (three) times daily. 05/29/19   Wallis Bamberg, PA-C  metroNIDAZOLE (FLAGYL) 500 MG tablet Take 1 tablet (500 mg total) by mouth 2 (two) times daily. 12/26/18   Eustace Moore, MD    Family History Family History   Problem Relation Age of Onset  . Stroke Mother   . Anesthesia problems Neg Hx     Social History Social History   Tobacco Use  . Smoking status: Never Smoker  . Smokeless tobacco: Never Used  Substance Use Topics  . Alcohol use: No  . Drug use: No     Allergies   Patient has no known allergies.   Review of Systems Review of Systems  Constitutional: Negative.   HENT: Positive for ear pain and sore throat. Negative for congestion, dental problem, drooling, ear discharge, facial swelling, hearing loss, mouth sores, nosebleeds, postnasal drip, rhinorrhea, sinus pressure, sinus pain, sneezing, tinnitus, trouble swallowing and voice change.   Eyes: Negative.   Respiratory: Negative.   Cardiovascular: Negative.   Gastrointestinal: Negative.   Endocrine: Negative.   Skin: Negative.   Allergic/Immunologic: Negative.      Physical Exam Triage Vital Signs ED Triage Vitals  Enc Vitals Group     BP 02/03/21 0936 114/70     Pulse Rate 02/03/21 0936 68     Resp 02/03/21 0936 18     Temp 02/03/21 0936 98.2 F (36.8 C)     Temp Source 02/03/21  8756 Oral     SpO2 02/03/21 0936 97 %     Weight --      Height --      Head Circumference --      Peak Flow --      Pain Score 02/03/21 0934 8     Pain Loc --      Pain Edu? --      Excl. in GC? --    No data found.  Updated Vital Signs BP 114/70 (BP Location: Left Arm)   Pulse 68   Temp 98.2 F (36.8 C) (Oral)   Resp 18   LMP 01/04/2021 (Exact Date)   SpO2 97%   Visual Acuity Right Eye Distance:   Left Eye Distance:   Bilateral Distance:    Right Eye Near:   Left Eye Near:    Bilateral Near:     Physical Exam Constitutional:      Appearance: Normal appearance. She is normal weight.  HENT:     Head: Normocephalic.     Right Ear: Ear canal and external ear normal. A middle ear effusion is present.     Left Ear: Ear canal and external ear normal. A middle ear effusion is present.     Nose: Rhinorrhea present. No  congestion.     Right Turbinates: Swollen.     Left Turbinates: Swollen.     Right Sinus: No maxillary sinus tenderness or frontal sinus tenderness.     Left Sinus: No maxillary sinus tenderness or frontal sinus tenderness.     Mouth/Throat:     Mouth: Mucous membranes are moist.     Pharynx: Posterior oropharyngeal erythema present.  Eyes:     Extraocular Movements: Extraocular movements intact.     Conjunctiva/sclera: Conjunctivae normal.     Pupils: Pupils are equal, round, and reactive to light.  Cardiovascular:     Rate and Rhythm: Normal rate and regular rhythm.     Pulses: Normal pulses.     Heart sounds: Normal heart sounds.  Pulmonary:     Effort: Pulmonary effort is normal.     Breath sounds: Normal breath sounds.  Musculoskeletal:        General: Normal range of motion.     Cervical back: Normal range of motion.  Lymphadenopathy:     Cervical: Cervical adenopathy present.  Skin:    General: Skin is warm and dry.  Neurological:     Mental Status: She is alert and oriented to person, place, and time. Mental status is at baseline.  Psychiatric:        Mood and Affect: Mood normal.        Behavior: Behavior normal.        Thought Content: Thought content normal.        Judgment: Judgment normal.      UC Treatments / Results  Labs (all labs ordered are listed, but only abnormal results are displayed) Labs Reviewed  SARS CORONAVIRUS 2 (TAT 6-24 HRS)  POC INFLUENZA A AND B ANTIGEN (URGENT CARE ONLY)    EKG   Radiology No results found.  Procedures Procedures (including critical care time)  Medications Ordered in UC Medications - No data to display  Initial Impression / Assessment and Plan / UC Course  I have reviewed the triage vital signs and the nursing notes.  Pertinent labs & imaging results that were available during my care of the patient were reviewed by me and considered in my medical decision making (see chart  for details).  Sinus  drainage  1. Covid- pending 2. Flu- pending 3. Flonase 2 sprays bid as needed 4. Chloraseptic spray as needed 5. Salt water gargles 6. Recently started using otc antihistamine but not taking consistently, encouraged daily use of medication  Final Clinical Impressions(s) / UC Diagnoses   Final diagnoses:  Sinus drainage     Discharge Instructions     Labs pending 24 hours, will be called if positive   Can use nasal spray 2 puffs in each nostril twice a day as needed  Can use throat spray as needed for comfort, sometimes works better if gargled  Salt water gargles as needed   ED Prescriptions    Medication Sig Dispense Auth. Provider   fluticasone (FLONASE) 50 MCG/ACT nasal spray Place 2 sprays into both nostrils daily. 9.9 mL Draven Natter R, NP   phenol (CHLORASEPTIC) 1.4 % LIQD Use as directed 1 spray in the mouth or throat as needed for throat irritation / pain. 20 mL Valinda Hoar, NP     PDMP not reviewed this encounter.   Valinda Hoar, NP 02/03/21 1008

## 2021-02-03 NOTE — Discharge Instructions (Addendum)
Labs pending 24 hours, will be called if positive   Can use nasal spray 2 puffs in each nostril twice a day as needed  Can use throat spray as needed for comfort, sometimes works better if gargled  Salt water gargles as needed

## 2021-02-03 NOTE — ED Triage Notes (Signed)
Pt c/o right side gland and ear pain x 2 days. Pt states it hurts to swallow. Pt denies fever.

## 2021-05-11 ENCOUNTER — Other Ambulatory Visit: Payer: Self-pay

## 2021-05-11 ENCOUNTER — Encounter (HOSPITAL_COMMUNITY): Payer: Self-pay

## 2021-05-11 ENCOUNTER — Ambulatory Visit (HOSPITAL_COMMUNITY)
Admission: EM | Admit: 2021-05-11 | Discharge: 2021-05-11 | Disposition: A | Discharge status: Home / Self Care | Payer: Medicaid Other | Attending: Medical Oncology | Admitting: Medical Oncology

## 2021-05-11 DIAGNOSIS — R519 Headache, unspecified: Secondary | ICD-10-CM

## 2021-05-11 DIAGNOSIS — Z3201 Encounter for pregnancy test, result positive: Secondary | ICD-10-CM

## 2021-05-11 DIAGNOSIS — R110 Nausea: Secondary | ICD-10-CM

## 2021-05-11 LAB — POC URINE PREG, ED: Preg Test, Ur: POSITIVE — AB

## 2021-05-11 MED ORDER — KETOROLAC TROMETHAMINE 60 MG/2ML IM SOLN: 60.0000 mg | Freq: Once | INTRAMUSCULAR | Status: DC

## 2021-05-11 NOTE — ED Triage Notes (Signed)
Pt c/o nausea and headache x 1 day.

## 2021-05-11 NOTE — Discharge Instructions (Addendum)
Please start a prenatal supplement

## 2021-05-11 NOTE — ED Provider Notes (Signed)
[facility]    CSN: [id number] Arrival date & time: 05/11/21  1535      History   Chief Complaint Chief Complaint  Patient presents with   Nausea   Headache    HPI Amanda Murillo is a 30 y.o. female.   HPI  Headache: Pt presents with a headache rated 6/10 in nature. Sinus to right sided in nature. Has fatigue and nausea yesterday. She has not vomited. She has tried to rest for symptoms without relief. No vomiting, fevers, cold symptoms, head injury, visual changes, neck stiffness. June 9th [facility] but she is concerned that her symptoms may be related to early pregnancy symptoms as she gets headaches when she is pregnant.    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 Authorizing Provider  cephALEXin (KEFLEX) 500 MG capsule Take 1 capsule (500 mg total) by mouth 3 (three) times daily. 05/29/19   Wallis Bamberg, PA-C  fluticasone (FLONASE) 50 MCG/ACT nasal spray Place 2 sprays into both nostrils daily. 02/03/21   White, Elita Boone, NP  metroNIDAZOLE (FLAGYL) 500 MG tablet Take 1 tablet (500 mg total) by mouth 2 (two) times daily. 12/26/18   Eustace Moore, MD  phenol (CHLORASEPTIC) 1.4 % LIQD Use as directed 1 spray in the mouth or throat as needed for throat irritation / pain. 02/03/21   Valinda Hoar, NP    Family  History Family History  Problem Relation Age of Onset   Stroke Mother    Anesthesia problems Neg Hx     Social History Social History   Tobacco Use   Smoking status: Never   Smokeless tobacco: Never  Substance Use Topics   Alcohol use: No   Drug use: No     Allergies   Patient has no known allergies.   Review of Systems Review of Systems  As stated above in HPI Physical Exam Triage Vital Signs ED Triage Vitals  Enc Vitals Group     BP 05/11/21 1644 94/63     Pulse Rate 05/11/21 1644 68     Resp 05/11/21 1644 17     Temp 05/11/21 1644 99 F (37.2 C)     Temp Source 05/11/21 1644 Oral     SpO2 05/11/21 1644 97 %     Weight --      Height --      Head Circumference --      Peak Flow --      Pain Score 05/11/21 1643 6  Pain Loc --      Pain Edu? --      Excl. in GC? --    No data found.  Updated Vital Signs BP 94/63 (BP Location: Right Arm)   Pulse 68   Temp 99 F (37.2 C) (Oral)   Resp 17   LMP 04/14/2021 (Exact Date)   SpO2 97%   Physical Exam Vitals and nursing note reviewed.  Constitutional:      General: She is not in acute distress.    Appearance: She is well-developed. She is not ill-appearing, toxic-appearing or diaphoretic.  HENT:     Head: Normocephalic and atraumatic.     Mouth/Throat:     Mouth: Mucous membranes are moist.  Eyes:     Extraocular Movements: Extraocular movements intact.     Right eye: Normal extraocular motion and no nystagmus.     Left eye: Normal extraocular motion and no nystagmus.     Pupils: Pupils are equal, round, and reactive to light.     Right eye: Pupil is round and reactive.     Left eye: Pupil is round and reactive.  Cardiovascular:     Rate and Rhythm: Normal rate and regular rhythm.     Heart sounds: Normal heart sounds.  Pulmonary:     Effort: Pulmonary effort is normal.     Breath sounds: Normal breath sounds.  Abdominal:     Palpations: Abdomen is soft.     Tenderness: There is no abdominal  tenderness.  Musculoskeletal:     Cervical back: Normal range of motion and neck supple. No rigidity.  Lymphadenopathy:     Cervical: No cervical adenopathy.  Skin:    General: Skin is warm.     Coloration: Skin is not cyanotic or pale.     Findings: No erythema.  Neurological:     Mental Status: She is alert and oriented to person, place, and time.     Cranial Nerves: No cranial nerve deficit, dysarthria or facial asymmetry.     Sensory: No sensory deficit.     Motor: No weakness.     Coordination: Coordination normal.     Gait: Gait normal.     Deep Tendon Reflexes: Reflexes normal.     UC Treatments / Results  Labs (all labs ordered are listed, but only abnormal results are displayed) Labs Reviewed  POC URINE PREG, ED    EKG   Radiology No results found.  Procedures Procedures (including critical care time)  Medications Ordered in UC Medications - No data to display  Initial Impression / Assessment and Plan / UC Course  I have reviewed the triage vital signs and the nursing notes.  Pertinent labs & imaging results that were available during my care of the patient were reviewed by me and considered in my medical decision making (see chart for details).     New.  Urine hCG is positive.  I want her to focus on increasing her hydration with water, she can use Tylenol as needed and indicated by the bottle and she needs to start a prenatal supplement.  She needs to follow-up with OB/GYN as well. Discussed red flag signs and symptoms.  Final Clinical Impressions(s) / UC Diagnoses   Final diagnoses:  Acute intractable headache, unspecified headache type  Nausea without vomiting  Positive pregnancy test     Discharge Instructions      Please start a prenatal supplement      ED Prescriptions   None  PDMP not reviewed this encounter.   Rushie Chestnut, [place] 05/11/21 1726

## 2021-09-28 ENCOUNTER — Other Ambulatory Visit (HOSPITAL_COMMUNITY): Payer: Self-pay | Admitting: Family

## 2021-09-28 ENCOUNTER — Other Ambulatory Visit: Payer: Self-pay | Admitting: Family

## 2021-09-28 DIAGNOSIS — N938 Other specified abnormal uterine and vaginal bleeding: Secondary | ICD-10-CM

## 2021-10-10 ENCOUNTER — Encounter (HOSPITAL_COMMUNITY): Payer: Self-pay

## 2021-10-10 ENCOUNTER — Ambulatory Visit (HOSPITAL_COMMUNITY): Admission: RE | Admit: 2021-10-10 | Payer: Medicaid Other | Source: Ambulatory Visit

## 2022-01-08 ENCOUNTER — Other Ambulatory Visit: Payer: Self-pay

## 2022-01-08 ENCOUNTER — Emergency Department (HOSPITAL_COMMUNITY)
Admission: EM | Admit: 2022-01-08 | Discharge: 2022-01-09 | Disposition: A | Discharge status: Home / Self Care | Payer: Medicaid Other | Attending: Emergency Medicine | Admitting: Emergency Medicine

## 2022-01-08 ENCOUNTER — Encounter (HOSPITAL_COMMUNITY): Payer: Self-pay | Admitting: Emergency Medicine

## 2022-01-08 DIAGNOSIS — J029 Acute pharyngitis, unspecified: Secondary | ICD-10-CM | POA: Diagnosis not present

## 2022-01-08 DIAGNOSIS — Z20822 Contact with and (suspected) exposure to covid-19: Secondary | ICD-10-CM | POA: Insufficient documentation

## 2022-01-08 DIAGNOSIS — J45909 Unspecified asthma, uncomplicated: Secondary | ICD-10-CM | POA: Insufficient documentation

## 2022-01-08 DIAGNOSIS — H9202 Otalgia, left ear: Secondary | ICD-10-CM | POA: Insufficient documentation

## 2022-01-08 LAB — GROUP A STREP BY PCR: Group A Strep by PCR: NOT DETECTED

## 2022-01-08 MED ORDER — NAPROXEN 500 MG PO TABS: 500.0000 mg | ORAL_TABLET | Freq: Two times a day (BID) | ORAL | 0 refills | Status: DC | PRN

## 2022-01-08 MED ORDER — DEXAMETHASONE SODIUM PHOSPHATE 10 MG/ML IJ SOLN
10.0000 mg | Freq: Once | INTRAMUSCULAR | Status: AC
  Administered 2022-01-09: 10 mg via INTRAMUSCULAR
  Filled 2022-01-08: qty 1

## 2022-01-08 MED ORDER — LIDOCAINE VISCOUS HCL 2 % MT SOLN
15.0000 mL | Freq: Once | OROMUCOSAL | Status: AC
  Administered 2022-01-09: 15 mL via OROMUCOSAL
  Filled 2022-01-08: qty 15

## 2022-01-08 NOTE — Discharge Instructions (Signed)
You were seen in the emergency department today for sore throat.  Your strep test was negative.  Your COVID/flu test is pending, you may see these results in MyChart we will call if these are positive. ? ?You are given steroids to help with pain and inflammation in the emergency department.  We are sending home with naproxen to help as needed for pain. ? ?- Naproxen- this is a nonsteroidal anti-inflammatory medication that will help with pain and swelling. Be sure to take this medication as prescribed with food, 1 pill every 12 hours,  It should be taken with food, as it can cause stomach upset, and more seriously, stomach bleeding. Do not take other nonsteroidal anti-inflammatory medications with this such as Advil, Motrin, Aleve, Mobic, Goodie Powder, or Motrin etc..   ? ?You make take Tylenol per over the counter dosing with these medications.  ? ?We have prescribed you new medication(s) today. Discuss the medications prescribed today with your pharmacist as they can have adverse effects and interactions with your other medicines including over the counter and prescribed medications. Seek medical evaluation if you start to experience new or abnormal symptoms after taking one of these medicines, seek care immediately if you start to experience difficulty breathing, feeling of your throat closing, facial swelling, or rash as these could be indications of a more serious allergic reaction ? ?Please try taking an over-the-counter antihistamine such as Zyrtec to help for possible allergic process. ? ?Follow-up with your primary care provider within 3 days.  Return to the emergency department for new or worsening symptoms including but not limited to new worsening pain, inability to swallow, inability to keep fluids down, fever, trouble opening your mouth or any other concerns. ? ? ?

## 2022-01-08 NOTE — ED Triage Notes (Signed)
Pt reports a sore throat, swollen glands and feeling poorly starting a few hours ago. ?

## 2022-01-08 NOTE — ED Provider Notes (Signed)
?[facility] EMERGENCY DEPARTMENT ?Provider Note ? ? ?CSN: [id number] ?Arrival date & time: 01/08/22  2104 ? ?  ? ?History ? ?Chief Complaint  ?Patient presents with  ? Sore Throat  ? ? ?Amanda Murillo is a 31 y.o. female with a hx of asthma who presents to the ED for evaluation of sore throat that started earlier today. Patient reports pain is constant, 9/10 in severity, worse with swallowing but able to swallow. Having associated left ear pain. Denies fever, chills, cough, dyspnea, or abdominal pain. She is concerned she has strep throat.  Denies chance of pregnancy. ? ?HPI ? ?  ? ?Home Medications ?Prior to Admission medications   ?Medication Sig Start Date End Date Taking? Authorizing Provider  ?cephALEXin (KEFLEX) 500 MG capsule Take 1 capsule (500 mg total) by mouth 3 (three) times daily. 05/29/19   Wallis Bamberg, PA-C  ?fluticasone (FLONASE) 50 MCG/ACT nasal spray Place 2 sprays into both nostrils daily. 02/03/21   Valinda Hoar, NP  ?metroNIDAZOLE (FLAGYL) 500 MG tablet Take 1 tablet (500 mg total) by mouth 2 (two) times daily. 12/26/18   Eustace Moore, MD  ?phenol (CHLORASEPTIC) 1.4 % LIQD Use as directed 1 spray in the mouth or throat as needed for throat irritation / pain. 02/03/21   Valinda Hoar, NP  ?   ? ?Allergies    ?Patient has no known allergies.   ? ?Review of Systems   ?Review of Systems  ?Constitutional:  Negative for fever.  ?HENT:  Positive for ear pain and sore throat. Negative for congestion.   ?Respiratory:  Negative for cough and shortness of breath.   ?Cardiovascular:  Negative for chest pain.  ?Gastrointestinal:  Negative for abdominal pain.  ?All other systems reviewed and are negative. ? ?Physical Exam ?Updated Vital Signs ?BP 114/75   Pulse 79   Temp 99.2 ?F (37.3 ?C) (Oral)   Resp 16   SpO2 100%  ?Physical Exam ?Vitals and nursing note reviewed.  ?Constitutional:   ?   General: She is not in acute distress. ?   Appearance: She is well-developed.  ?HENT:  ?    Head: Normocephalic and atraumatic.  ?   Right Ear: Ear canal normal. Tympanic membrane is not perforated, erythematous, retracted or bulging.  ?   Left Ear: Ear canal normal. Tympanic membrane is not perforated, erythematous, retracted or bulging.  ?   Ears:  ?   Comments: No mastoid erythema/swelling/tenderness.  ?   Nose:  ?   Right Sinus: No maxillary sinus tenderness or frontal sinus tenderness.  ?   Left Sinus: No maxillary sinus tenderness or frontal sinus tenderness.  ?   Mouth/Throat:  ?   Pharynx: Uvula midline. No oropharyngeal exudate or posterior oropharyngeal erythema.  ?   Tonsils: No tonsillar exudate or tonsillar abscesses.  ?   Comments: Posterior oropharynx is symmetric appearing. Patient tolerating own secretions without difficulty. No trismus. No drooling. No hot potato voice. No swelling beneath the tongue, submandibular compartment is soft.  ?Eyes:  ?   General:     ?   Right eye: No discharge.     ?   Left eye: No discharge.  ?   Conjunctiva/sclera: Conjunctivae normal.  ?   Pupils: Pupils are equal, round, and reactive to light.  ?Cardiovascular:  ?   Rate and Rhythm: Normal rate and regular rhythm.  ?   Heart sounds: No murmur heard. ?Pulmonary:  ?   Effort: Pulmonary effort is  normal. No respiratory distress.  ?   Breath sounds: Normal breath sounds. No wheezing, rhonchi or rales.  ?Abdominal:  ?   General: There is no distension.  ?   Palpations: Abdomen is soft.  ?   Tenderness: There is no abdominal tenderness.  ?Musculoskeletal:  ?   Cervical back: Normal range of motion and neck supple. No edema or rigidity.  ?Lymphadenopathy:  ?   Cervical: No cervical adenopathy.  ?Skin: ?   General: Skin is warm and dry.  ?   Findings: No rash.  ?Neurological:  ?   Mental Status: She is alert.  ?Psychiatric:     ?   Behavior: Behavior normal.  ? ? ?ED Results / Procedures / Treatments   ?Labs ?(all labs ordered are listed, but only abnormal results are displayed) ?Labs Reviewed  ?GROUP A STREP BY  PCR  ?RESP PANEL BY RT-PCR (FLU A&B, COVID) [id number]  ? ? ?EKG ?None ? ?Radiology ?No results found. ? ?Procedures ?Procedures  ? ? ?Medications Ordered in ED ?Medications - No data to display ? ?ED Course/ Medical Decision Making/ A&P ?  ?                        ?Medical Decision Making ?Risk ?Prescription drug management. ? ?Patient presents to the ED with complaints of sore throat.  ?Nontoxic, vitals WNL.  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  ? ?I viewed lab testing, interpreted, Strep testing is negative, covid/flu pending.  ? ?Exam not consistent with RPA/PTA.  No signs of AOM, AOE, or mastoiditis.  Likely viral versus allergic process.  IM Decadron given in the ED as well as viscous lidocaine.  Patient overall is reasonable for discharge home with supportive care.  Discussed PCP follow-up. ? ?I discussed results, treatment plan, need for follow-up, and return precautions with the patient. Provided opportunity for questions, patient confirmed understanding and is in agreement with plan.  ? ? ? ? ? ? ? ? ?Final Clinical Impression(s) / ED Diagnoses ?Final diagnoses:  ?Sore throat  ? ? ?Rx / DC Orders ?ED Discharge Orders   ? ?      Ordered  ?  naproxen (NAPROSYN) 500 MG tablet  2 times daily PRN       ? 01/08/22 2354  ? ?  ?  ? ?  ? ? ?  ?Amaryllis Dyke, PA-C ?01/09/22 [id number] ? ?  ?Drenda Freeze, MD ?01/09/22 1458 ? ?

## 2022-01-09 LAB — RESP PANEL BY RT-PCR (FLU A&B, COVID) ARPGX2
Influenza A by PCR: NEGATIVE
Influenza B by PCR: NEGATIVE
SARS Coronavirus 2 by RT PCR: NEGATIVE

## 2022-03-10 ENCOUNTER — Ambulatory Visit (HOSPITAL_COMMUNITY)
Admission: EM | Admit: 2022-03-10 | Discharge: 2022-03-10 | Disposition: A | Discharge status: Home / Self Care | Payer: Medicaid Other | Attending: Family Medicine | Admitting: Family Medicine

## 2022-03-10 ENCOUNTER — Ambulatory Visit (INDEPENDENT_AMBULATORY_CARE_PROVIDER_SITE_OTHER): Payer: Medicaid Other

## 2022-03-10 DIAGNOSIS — J4541 Moderate persistent asthma with (acute) exacerbation: Secondary | ICD-10-CM | POA: Diagnosis not present

## 2022-03-10 DIAGNOSIS — R0602 Shortness of breath: Secondary | ICD-10-CM

## 2022-03-10 DIAGNOSIS — R059 Cough, unspecified: Secondary | ICD-10-CM

## 2022-03-10 MED ORDER — ALBUTEROL SULFATE (2.5 MG/3ML) 0.083% IN NEBU
INHALATION_SOLUTION | RESPIRATORY_TRACT | Status: AC
  Filled 2022-03-10: qty 3

## 2022-03-10 MED ORDER — METHYLPREDNISOLONE SODIUM SUCC 125 MG IJ SOLR
INTRAMUSCULAR | Status: AC
  Filled 2022-03-10: qty 2

## 2022-03-10 MED ORDER — PREDNISONE 50 MG PO TABS: 50.0000 mg | ORAL_TABLET | Freq: Every day | ORAL | 0 refills | Status: AC

## 2022-03-10 MED ORDER — ALBUTEROL SULFATE (2.5 MG/3ML) 0.083% IN NEBU
2.5000 mg | INHALATION_SOLUTION | Freq: Once | RESPIRATORY_TRACT | Status: AC
  Administered 2022-03-10: 2.5 mg via RESPIRATORY_TRACT

## 2022-03-10 MED ORDER — METHYLPREDNISOLONE SODIUM SUCC 125 MG IJ SOLR
125.0000 mg | Freq: Once | INTRAMUSCULAR | Status: AC
  Administered 2022-03-10: 125 mg via INTRAMUSCULAR

## 2022-03-10 NOTE — ED Triage Notes (Signed)
C/o sob and asthma flare-up that started 2 days ago.  ?

## 2022-03-10 NOTE — ED Provider Notes (Signed)
?[facility] ? ? ? ?CSN: [id number] ?Arrival date & time: 03/10/22  1194 ? ? ?  ? ?History   ?Chief Complaint ?Chief Complaint  ?Patient presents with  ? Asthma  ? ? ?HPI ?Amanda Murillo is a 31 y.o. female.  ? ?Patient is here for asthma exacerbation.  ?Has had symptoms for several days.  Usually the inhaler helps with her symptoms.  ?Yesterday was at work all day and could not use her inhaler.  She used her inhaler at home that evening with some help, but having worsening symptoms.  ?She has used her albuterol inhaler throughout the night, but still sob with activity.  She is sob with activity, walking short distances.  ?No fevers/chills, but has had pneumonia in the past.  ?She last used her neb about 4am.  ?She thinks she has allergies. And works in a ware house with lots of dust.  ? ? ?Past Medical History:  ?Diagnosis Date  ? Asthma   ? Bronchitis   ? Trichomonas infection   ? Trisomy 18 in child of prior pregnancy, currently pregnant   ? ? ?Patient Active Problem List  ? Diagnosis Date Noted  ? Gastritis and gastroduodenitis 02/08/2015  ? Anemia 02/02/2015  ? Prolonged QT interval 02/02/2015  ? ? ?Past Surgical History:  ?Procedure Laterality Date  ? NO PAST SURGERIES    ? THERAPEUTIC ABORTION    ? ? ?OB History   ? ? Gravida  ?5  ? Para  ?4  ? Term  ?4  ? Preterm  ?0  ? AB  ?1  ? Living  ?3  ?  ? ? SAB  ?0  ? IAB  ?1  ? Ectopic  ?0  ? Multiple  ?0  ? Live Births  ?3  ?   ?  ?  ? ? ? ?Home Medications   ? ?Prior to Admission medications   ?Medication Sig Start Date End Date Taking? Authorizing Provider  ?cephALEXin (KEFLEX) 500 MG capsule Take 1 capsule (500 mg total) by mouth 3 (three) times daily. 05/29/19   Wallis Bamberg, PA-C  ?fluticasone (FLONASE) 50 MCG/ACT nasal spray Place 2 sprays into both nostrils daily. 02/03/21   Valinda Hoar, NP  ?metroNIDAZOLE (FLAGYL) 500 MG tablet Take 1 tablet (500 mg total) by mouth 2 (two) times daily. 12/26/18   Eustace Moore, MD  ?naproxen (NAPROSYN) 500 MG  tablet Take 1 tablet (500 mg total) by mouth 2 (two) times daily as needed for moderate pain. 01/08/22   Petrucelli, Samantha R, PA-C  ?phenol (CHLORASEPTIC) 1.4 % LIQD Use as directed 1 spray in the mouth or throat as needed for throat irritation / pain. 02/03/21   Valinda Hoar, NP  ? ? ?Family History ?Family History  ?Problem Relation Age of Onset  ? Stroke Mother   ? Anesthesia problems Neg Hx   ? ? ?Social History ?Social History  ? ?Tobacco Use  ? Smoking status: Never  ? Smokeless tobacco: Never  ?Substance Use Topics  ? Alcohol use: No  ? Drug use: No  ? ? ? ?Allergies   ?Patient has no known allergies. ? ? ?Review of Systems ?Review of Systems  ?Constitutional:  Negative for chills and fever.  ?HENT: Negative.    ?Respiratory:  Positive for cough, shortness of breath and wheezing.   ?Cardiovascular: Negative.   ?Gastrointestinal: Negative.   ?Genitourinary: Negative.   ?Musculoskeletal: Negative.   ? ? ?Physical Exam ?Triage Vital Signs ?ED Triage  Vitals  ?Enc Vitals Group  ?   BP 03/10/22 0859 (!) 99/56  ?   Pulse Rate 03/10/22 0900 (!) 56  ?   Resp 03/10/22 0859 18  ?   Temp --   ?   Temp src --   ?   SpO2 03/10/22 0859 99 %  ?   Weight --   ?   Height --   ?   Head Circumference --   ?   Peak Flow --   ?   Pain Score --   ?   Pain Loc --   ?   Pain Edu? --   ?   Excl. in GC? --   ? ?No data found. ? ?Updated Vital Signs ?BP (!) 99/56 (BP Location: Left Arm)   Pulse (!) 56   Resp 18   SpO2 99%  ? ?Visual Acuity ?Right Eye Distance:   ?Left Eye Distance:   ?Bilateral Distance:   ? ?Right Eye Near:   ?Left Eye Near:    ?Bilateral Near:    ? ?Physical Exam ?Constitutional:   ?   Appearance: Normal appearance.  ?   Comments: Takes deep breaths during conversation  ?HENT:  ?   Head: Normocephalic.  ?   Mouth/Throat:  ?   Mouth: Mucous membranes are moist.  ?Cardiovascular:  ?   Rate and Rhythm: Normal rate and regular rhythm.  ?Pulmonary:  ?   Effort: Pulmonary effort is normal. No respiratory distress.   ?   Breath sounds: Wheezing and rhonchi present.  ?Musculoskeletal:  ?   Cervical back: Normal range of motion.  ?Neurological:  ?   Mental Status: She is alert.  ? ? ? ?UC Treatments / Results  ?Labs ?(all labs ordered are listed, but only abnormal results are displayed) ?Labs Reviewed - No data to display ? ?EKG ? ? ?Radiology ?DG Chest 2 View ? ?Result Date: 03/10/2022 ?CLINICAL DATA:  Shortness of breath, cough. EXAM: CHEST - 2 VIEW COMPARISON:  February 03, 2015. FINDINGS: The heart size and mediastinal contours are within normal limits. Both lungs are clear. The visualized skeletal structures are unremarkable. IMPRESSION: No active cardiopulmonary disease. Electronically Signed   By: Lupita RaiderJames  Green Jr M.D.   On: 03/10/2022 09:32   ? ?Procedures ?Procedures (including critical care time) ? ?Medications Ordered in UC ?Medications  ?methylPREDNISolone sodium succinate (SOLU-MEDROL) 125 mg/2 mL injection 125 mg (has no administration in time range)  ?albuterol (PROVENTIL) (2.5 MG/3ML) 0.083% nebulizer solution 2.5 mg (2.5 mg Nebulization Given 03/10/22 0917)  ? ? ?Initial Impression / Assessment and Plan / UC Course  ?I have reviewed the triage vital signs and the nursing notes. ? ?Pertinent labs & imaging results that were available during my care of the patient were reviewed by me and considered in my medical decision making (see chart for details). ? ?  ? ?Final Clinical Impressions(s) / UC Diagnoses  ? ?Final diagnoses:  ?Moderate persistent asthma with exacerbation  ?Shortness of breath  ? ? ? ?Discharge Instructions   ? ?  ?You were seen today for asthma exacerbation.  ?Your chest xray was negative for pneumonia.  ?I have treated you with an albuterol nebulizer today, and a shot of steroid.  ?I have sent out an oral prednisone for you as well to take x 6 days.  Please start this tomorrow.  ?You may continue your albuterol nebulizer at home.  ?If you have worsening shortness of breath, then please go to the ER  for  further evaluation and treatment.  ? ? ? ?ED Prescriptions   ? ? Medication Sig Dispense Auth. Provider  ? predniSONE (DELTASONE) 50 MG tablet Take 1 tablet (50 mg total) by mouth daily with breakfast for 5 days. 5 tablet Jannifer Franklin, MD  ? ?  ? ?PDMP not reviewed this encounter. ?  Jannifer Franklin, MD ?03/10/22 [phone] ? ?

## 2022-03-10 NOTE — Discharge Instructions (Addendum)
You were seen today for asthma exacerbation.  ?Your chest xray was negative for pneumonia.  ?I have treated you with an albuterol nebulizer today, and a shot of steroid.  ?I have sent out an oral prednisone for you as well to take x 6 days.  Please start this tomorrow.  ?You may continue your albuterol nebulizer at home.  ?If you have worsening shortness of breath, then please go to the ER for further evaluation and treatment.  ?

## 2022-07-03 ENCOUNTER — Ambulatory Visit (HOSPITAL_COMMUNITY)
Admission: EM | Admit: 2022-07-03 | Discharge: 2022-07-03 | Disposition: A | Discharge status: Home / Self Care | Payer: Medicaid Other | Attending: Internal Medicine | Admitting: Internal Medicine

## 2022-07-03 ENCOUNTER — Telehealth (HOSPITAL_COMMUNITY): Payer: Self-pay | Admitting: *Deleted

## 2022-07-03 DIAGNOSIS — K0889 Other specified disorders of teeth and supporting structures: Secondary | ICD-10-CM | POA: Diagnosis not present

## 2022-07-03 DIAGNOSIS — S025XXA Fracture of tooth (traumatic), initial encounter for closed fracture: Secondary | ICD-10-CM | POA: Diagnosis not present

## 2022-07-03 MED ORDER — NAPROXEN 500 MG PO TABS: 500.0000 mg | ORAL_TABLET | Freq: Two times a day (BID) | ORAL | 0 refills | Status: DC | PRN

## 2022-07-03 MED ORDER — AMOXICILLIN-POT CLAVULANATE 875-125 MG PO TABS: 1.0000 | ORAL_TABLET | Freq: Two times a day (BID) | ORAL | 0 refills | Status: AC

## 2022-07-03 MED ORDER — AMOXICILLIN-POT CLAVULANATE 875-125 MG PO TABS: 1.0000 | ORAL_TABLET | Freq: Two times a day (BID) | ORAL | 0 refills | Status: DC

## 2022-07-03 NOTE — ED Triage Notes (Signed)
Pt is here for tooth pain x1wk as per pt. Pt states she has pain in ears as well

## 2022-07-03 NOTE — ED Provider Notes (Signed)
[place]    CSN: [id number] Arrival date & time: 07/03/22  1115      History   Chief Complaint Chief Complaint  Patient presents with   Dental Pain    HPI Amanda Murillo is a 31 y.o. female.   Patient presents to urgent care for evaluation of dental pain to the right upper aspect of her mouth and states that she believes she may have broken one of her wisdom teeth.  She has never had wisdom teeth removed in the past and states that she has a partial denture to the upper aspect of her mouth due to previous injury.  States that the partial is pressing on the injured tooth and causing pain.  Also reports swelling and pain to the right ear/right facial region that she attributes to the broken tooth/tooth pain.  She has an appointment with a dentist on July 13, 2022 but states that she is having a very difficult time eating due to cold sensitivity and pain with chewing.  Denies difficulty swallowing, shortness of breath, bleeding or drainage from the tooth, sore throat, decreased hearing, dizziness, and fever/chills.  No recent antibiotic use.  No recent illnesses or infections.  She has attempted use of Aleve over-the-counter at home for dental pain with some relief prior to arrival urgent care.  Last dose of Aleve was this morning.   Dental Pain   Past Medical History:  Diagnosis Date   Asthma    Bronchitis    Trichomonas infection    Trisomy 18 in child of prior pregnancy, currently pregnant     Patient Active Problem List   Diagnosis Date Noted   Gastritis and gastroduodenitis 02/08/2015   Anemia 02/02/2015   Prolonged QT interval 02/02/2015    Past Surgical History:  Procedure Laterality Date   NO PAST SURGERIES     THERAPEUTIC ABORTION      OB History     Gravida  5   Para  4   Term  4   Preterm  0   AB  1   Living  3      SAB  0   IAB  1   Ectopic  0   Multiple  0   Live Births  3            Home Medications    Prior  to Admission medications   Medication Sig Start Date End Date Taking? Authorizing Provider  amoxicillin-clavulanate (AUGMENTIN) 875-125 MG tablet Take 1 tablet by mouth 2 (two) times daily for 7 days. 07/03/22 07/10/22 Yes Tevin Shillingford, Stasia Cavalier, FNP  cephALEXin (KEFLEX) 500 MG capsule Take 1 capsule (500 mg total) by mouth 3 (three) times daily. 05/29/19   Jaynee Eagles, PA-C  fluticasone (FLONASE) 50 MCG/ACT nasal spray Place 2 sprays into both nostrils daily. 02/03/21   White, Leitha Schuller, NP  metroNIDAZOLE (FLAGYL) 500 MG tablet Take 1 tablet (500 mg total) by mouth 2 (two) times daily. 12/26/18   Raylene Everts, MD  naproxen (NAPROSYN) 500 MG tablet Take 1 tablet (500 mg total) by mouth 2 (two) times daily as needed for moderate pain. 07/03/22   Talbot Grumbling, FNP  phenol (CHLORASEPTIC) 1.4 % LIQD Use as directed 1 spray in the mouth or throat as needed for throat irritation / pain. 02/03/21   Hans Eden, NP    Family History Family History  Problem Relation Age of Onset   Stroke Mother    Anesthesia problems Neg  Hx     Social History Social History   Tobacco Use   Smoking status: Never   Smokeless tobacco: Never  Substance Use Topics   Alcohol use: No   Drug use: No     Allergies   Patient has no known allergies.   Review of Systems Review of Systems Per HPI  Physical Exam Triage Vital Signs ED Triage Vitals  Enc Vitals Group     BP 07/03/22 1217 110/77     Pulse Rate 07/03/22 1217 66     Resp 07/03/22 1217 12     Temp 07/03/22 1217 98.6 F (37 C)     Temp Source 07/03/22 1217 Oral     SpO2 07/03/22 1217 98 %     Weight 07/03/22 1218 160 lb (72.6 kg)     Height 07/03/22 1218 4\' 11"  (1.499 m)     Head Circumference --      Peak Flow --      Pain Score --      Pain Loc --      Pain Edu? --      Excl. in GC? --    No data found.  Updated Vital Signs BP 110/77 (BP Location: Left Arm)   Pulse 66   Temp 98.6 F (37 C) (Oral)   Resp 12   Ht 4'  11" (1.499 m)   Wt 160 lb (72.6 kg)   SpO2 98%   BMI 32.32 kg/m   Visual Acuity Right Eye Distance:   Left Eye Distance:   Bilateral Distance:    Right Eye Near:   Left Eye Near:    Bilateral Near:     Physical Exam Vitals and nursing note reviewed.  Constitutional:      Appearance: Normal appearance. She is not ill-appearing or toxic-appearing.     Comments: Very pleasant patient sitting on exam in position of comfort table in no acute distress.   HENT:     Head: Normocephalic and atraumatic.     Right Ear: Hearing, tympanic membrane, ear canal and external ear normal.     Left Ear: Hearing, tympanic membrane, ear canal and external ear normal.     Nose: Nose normal.     Mouth/Throat:     Lips: Pink.     Mouth: Mucous membranes are moist.     Dentition: Abnormal dentition. Has dentures. Dental tenderness present.     Pharynx: No pharyngeal swelling or posterior oropharyngeal erythema.     Tonsils: No tonsillar exudate.      Comments: Broken tooth to the area outlined in above image to the top of mouth to the posterior right side. TTP of gum line of the right upper right posterior mouth. No drainage visualized. No erythema to posterior oropharynx. Partial denture present to upper mouth.  Eyes:     General: Lids are normal. Vision grossly intact. Gaze aligned appropriately.     Extraocular Movements: Extraocular movements intact.     Conjunctiva/sclera: Conjunctivae normal.  Cardiovascular:     Rate and Rhythm: Normal rate and regular rhythm.     Heart sounds: Normal heart sounds, S1 normal and S2 normal.  Pulmonary:     Effort: Pulmonary effort is normal. No respiratory distress.     Breath sounds: Normal breath sounds and air entry.  Musculoskeletal:     Cervical back: Normal range of motion and neck supple.  Lymphadenopathy:     Cervical: Cervical adenopathy present.  Skin:    General: Skin is  warm and dry.     Capillary Refill: Capillary refill takes less than 2  seconds.     Findings: No rash.  Neurological:     General: No focal deficit present.     Mental Status: She is alert and oriented to person, place, and time. Mental status is at baseline.     Cranial Nerves: No dysarthria or facial asymmetry.     Gait: Gait is intact.  Psychiatric:        Mood and Affect: Mood normal.        Speech: Speech normal.        Behavior: Behavior normal.        Thought Content: Thought content normal.        Judgment: Judgment normal.      UC Treatments / Results  Labs (all labs ordered are listed, but only abnormal results are displayed) Labs Reviewed - No data to display  EKG   Radiology No results found.  Procedures Procedures (including critical care time)  Medications Ordered in UC Medications - No data to display  Initial Impression / Assessment and Plan / UC Course  I have reviewed the triage vital signs and the nursing notes.  Pertinent labs & imaging results that were available during my care of the patient were reviewed by me and considered in my medical decision making (see chart for details).   1.  Dental pain and closed fracture of tooth Dental pain related to broken posterior upper tooth.  There is cervical adenopathy on the right side with tenderness to palpation of the gumline and near broken tooth.  Plan to cover for dental infection with Augmentin twice daily for the next 7 days to be taken with food.  Patient may continue use of naproxen 500 mg every 12 hours for dental pain and inflammation.  Advised intake of cool soft foods and smoothies.  Advised ice to the right side of the face to reduce inflammation, swelling, and pain.  Recommend follow-up with dentist as scheduled on July 13, 2022 for evaluation and management of dental pain/infection.  Patient agreeable with plan.   Discussed physical exam and available lab work findings in clinic with patient.  Counseled patient regarding appropriate use of medications and  potential side effects for all medications recommended or prescribed today. Discussed red flag signs and symptoms of worsening condition,when to call the PCP office, return to urgent care, and when to seek higher level of care in the emergency department. Patient verbalizes understanding and agreement with plan. All questions answered. Patient discharged in stable condition.  Final Clinical Impressions(s) / UC Diagnoses   Final diagnoses:  Pain, dental  Closed fracture of tooth, initial encounter     Discharge Instructions      Take augmentin 2 times daily for the next 7 days with food to avoid stomach upset. Apply ice to your right face/cheek to reduce inflammation and pain related to dental infection and broken tooth. Take naproxen 500mg  (Aleve) every 12 hours for dental pain and inflammation with food to avoid stomach upset. Eat soft and cool foods for the next week until your follow-up dentist appointment on July 13, 2022.  Return to urgent care as needed. Go to the emergency room if your symptoms become severe. I hope you feel better!        ED Prescriptions     Medication Sig Dispense Auth. Provider   amoxicillin-clavulanate (AUGMENTIN) 875-125 MG tablet Take 1 tablet by mouth 2 (two) times daily  for 7 days. 14 tablet Joella Prince M, FNP   naproxen (NAPROSYN) 500 MG tablet  (Status: Discontinued) Take 1 tablet (500 mg total) by mouth 2 (two) times daily as needed for moderate pain. 15 tablet Joella Prince M, FNP   naproxen (NAPROSYN) 500 MG tablet Take 1 tablet (500 mg total) by mouth 2 (two) times daily as needed for moderate pain. 15 tablet Talbot Grumbling, FNP      PDMP not reviewed this encounter.   Talbot Grumbling, [place] 07/03/22 1327

## 2022-07-03 NOTE — Discharge Instructions (Addendum)
Take augmentin 2 times daily for the next 7 days with food to avoid stomach upset. Apply ice to your right face/cheek to reduce inflammation and pain related to dental infection and broken tooth. Take naproxen 500mg  (Aleve) every 12 hours for dental pain and inflammation with food to avoid stomach upset. Eat soft and cool foods for the next week until your follow-up dentist appointment on July 13, 2022.  Return to urgent care as needed. Go to the emergency room if your symptoms become severe. I hope you feel better!

## 2022-10-16 ENCOUNTER — Other Ambulatory Visit: Payer: Self-pay

## 2022-10-16 ENCOUNTER — Emergency Department (HOSPITAL_COMMUNITY): Payer: Medicaid Other

## 2022-10-16 ENCOUNTER — Encounter (HOSPITAL_COMMUNITY): Payer: Self-pay

## 2022-10-16 ENCOUNTER — Emergency Department (HOSPITAL_COMMUNITY)
Admission: EM | Admit: 2022-10-16 | Discharge: 2022-10-17 | Disposition: A | Discharge status: Home / Self Care | Payer: Medicaid Other | Attending: Emergency Medicine | Admitting: Emergency Medicine

## 2022-10-16 DIAGNOSIS — Z7951 Long term (current) use of inhaled steroids: Secondary | ICD-10-CM | POA: Diagnosis not present

## 2022-10-16 DIAGNOSIS — R079 Chest pain, unspecified: Secondary | ICD-10-CM | POA: Diagnosis present

## 2022-10-16 DIAGNOSIS — J45901 Unspecified asthma with (acute) exacerbation: Secondary | ICD-10-CM | POA: Diagnosis not present

## 2022-10-16 DIAGNOSIS — B974 Respiratory syncytial virus as the cause of diseases classified elsewhere: Secondary | ICD-10-CM | POA: Insufficient documentation

## 2022-10-16 DIAGNOSIS — Z1152 Encounter for screening for COVID-19: Secondary | ICD-10-CM | POA: Diagnosis not present

## 2022-10-16 DIAGNOSIS — B338 Other specified viral diseases: Secondary | ICD-10-CM

## 2022-10-16 LAB — D-DIMER, QUANTITATIVE: D-Dimer, Quant: 0.31 ug/mL-FEU (ref 0.00–0.50)

## 2022-10-16 LAB — I-STAT BETA HCG BLOOD, ED (MC, WL, AP ONLY): I-stat hCG, quantitative: <5 m[IU]/mL (ref <5)

## 2022-10-16 LAB — BASIC METABOLIC PANEL
Anion gap: 7 (ref 5–15)
BUN: 15 mg/dL (ref 6–20)
CO2: 24 mmol/L (ref 22–32)
Calcium: 8.9 mg/dL (ref 8.9–10.3)
Chloride: 109 mmol/L (ref 98–111)
Creatinine, Ser: 0.65 mg/dL (ref 0.44–1.00)
GFR, Estimated: >60 mL/min (ref >60)
Glucose, Bld: 79 mg/dL (ref 70–99)
Potassium: 3 mmol/L — ABNORMAL LOW (ref 3.5–5.1)
Sodium: 140 mmol/L (ref 135–145)

## 2022-10-16 LAB — CBC
HCT: 39.2 % (ref 36.0–46.0)
Hemoglobin: 12.6 g/dL (ref 12.0–15.0)
MCH: 26.4 pg (ref 26.0–34.0)
MCHC: 32.1 g/dL (ref 30.0–36.0)
MCV: 82.2 fL (ref 80.0–100.0)
Platelets: 252 10*3/uL (ref 150–400)
RBC: 4.77 MIL/uL (ref 3.87–5.11)
RDW: 13.1 % (ref 11.5–15.5)
WBC: 7 10*3/uL (ref 4.0–10.5)
nRBC: 0 % (ref 0.0–0.2)

## 2022-10-16 LAB — TROPONIN I (HIGH SENSITIVITY)
Troponin I (High Sensitivity): <2 ng/L (ref <18)
Troponin I (High Sensitivity): <2 ng/L (ref <18)

## 2022-10-16 LAB — MAGNESIUM: Magnesium: 1.9 mg/dL (ref 1.7–2.4)

## 2022-10-16 MED ORDER — POTASSIUM CHLORIDE CRYS ER 20 MEQ PO TBCR
40.0000 meq | EXTENDED_RELEASE_TABLET | Freq: Once | ORAL | Status: AC
  Administered 2022-10-16: 40 meq via ORAL
  Filled 2022-10-16: qty 2

## 2022-10-16 MED ORDER — LIDOCAINE VISCOUS HCL 2 % MT SOLN
15.0000 mL | Freq: Once | OROMUCOSAL | Status: AC
  Administered 2022-10-16: 15 mL via ORAL
  Filled 2022-10-16: qty 15

## 2022-10-16 MED ORDER — POTASSIUM CHLORIDE CRYS ER 20 MEQ PO TBCR: 20.0000 meq | EXTENDED_RELEASE_TABLET | Freq: Every day | ORAL | 0 refills | Status: DC

## 2022-10-16 MED ORDER — ALUM & MAG HYDROXIDE-SIMETH 200-200-20 MG/5ML PO SUSP
30.0000 mL | Freq: Once | ORAL | Status: AC
  Administered 2022-10-16: 30 mL via ORAL
  Filled 2022-10-16: qty 30

## 2022-10-16 NOTE — ED Provider Triage Note (Signed)
Emergency Medicine Provider Triage Evaluation Note  Amanda Murillo , a 31 y.o. female  was evaluated in triage.  Pt complains of chest pain that began 2 weeks ago.  It comes and goes.  She does not really know when it comes and goes but thinks maybe it is more if she lifts her arm up.  She has no other associated symptoms..  Review of Systems  Positive:  Negative:   Physical Exam  BP 103/71 (BP Location: Right Arm)   Pulse 65   Temp 98.1 F (36.7 C) (Oral)   Resp 19   SpO2 100%  Gen:   Awake, no distress   Resp:  Normal effort  MSK:   Moves extremities without difficulty  Other:  Not reproducible. Lungs clear  Medical Decision Making  Medically screening exam initiated at 1:51 PM.  Appropriate orders placed.  Amanda Murillo was informed that the remainder of the evaluation will be completed by another provider, this initial triage assessment does not replace that evaluation, and the importance of remaining in the ED until their evaluation is complete.     Claudie Leach, PA-C 10/16/22 1351

## 2022-10-16 NOTE — ED Triage Notes (Signed)
Patient has had right sided chest pain for 2 weeks. No nausea or vomiting or any other symptoms. No radiation.

## 2022-10-16 NOTE — Discharge Instructions (Addendum)
Your EKG, chest x-ray and laboratory workup was reassuring today.  Your cardiac enzymes were normal and the blood test that checks for blood clots in your lungs was also reassuring.  Potassium was mildly low which was supplemented orally.  Recommend you take a few days of a potassium supplement.  Follow-up with a primary care provider.

## 2022-10-16 NOTE — ED Provider Notes (Signed)
[facility]-EMERGENCY DEPT Provider Note   CSN: [id number] Arrival date & time: 10/16/22  1330     History {Add pertinent medical, surgical, social history, OB history to HPI:1} Chief Complaint  Patient presents with   Chest Pain    Amanda Murillo is a 31 y.o. female.   Chest Pain      Home Medications Prior to Admission medications   Medication Sig Start Date End Date Taking? Authorizing Provider  cephALEXin (KEFLEX) 500 MG capsule Take 1 capsule (500 mg total) by mouth 3 (three) times daily. 05/29/19   Wallis Bamberg, PA-C  fluticasone (FLONASE) 50 MCG/ACT nasal spray Place 2 sprays into both nostrils daily. 02/03/21   White, Elita Boone, NP  metroNIDAZOLE (FLAGYL) 500 MG tablet Take 1 tablet (500 mg total) by mouth 2 (two) times daily. 12/26/18   Eustace Moore, MD  naproxen (NAPROSYN) 500 MG tablet Take 1 tablet (500 mg total) by mouth 2 (two) times daily as needed for moderate pain. 07/03/22   Carlisle Beers, FNP  phenol (CHLORASEPTIC) 1.4 % LIQD Use as directed 1 spray in the mouth or throat as needed for throat irritation / pain. 02/03/21   Valinda Hoar, NP      Allergies    Patient has no known allergies.    Review of Systems   Review of Systems  Cardiovascular:  Positive for chest pain.    Physical Exam Updated Vital Signs BP 91/66   Pulse (!) 58   Temp 98.6 F (37 C) (Oral)   Resp 17   SpO2 100%  Physical Exam  ED Results / Procedures / Treatments   Labs (all labs ordered are listed, but only abnormal results are displayed) Labs Reviewed  BASIC METABOLIC PANEL - Abnormal; Notable for the following components:      Result Value   Potassium 3.0 (*)    All other components within normal limits  CBC  I-STAT BETA HCG BLOOD, ED ([facility], [facility], [facility] ONLY)  TROPONIN I (HIGH SENSITIVITY)  TROPONIN I (HIGH SENSITIVITY)    EKG EKG Interpretation  Date/Time:  Monday October 16 2022 14:10:57 EST Ventricular Rate:  62 PR  Interval:  164 QRS Duration: 82 QT Interval:  395 QTC Calculation: 402 R Axis:   66 Text Interpretation: Sinus rhythm Borderline T abnormalities, anterior leads Confirmed by Ernie Avena ([id number]) on 10/16/2022 10:43:56 PM  Radiology DG Chest Port 1 View  Result Date: 10/16/2022 CLINICAL DATA:  Right-sided chest pain for 2 weeks. EXAM: PORTABLE CHEST 1 VIEW COMPARISON:  Chest radiographs 03/10/2022 FINDINGS: The cardiomediastinal silhouette is unchanged with normal heart size. No airspace consolidation, edema, sizable pleural effusion, or pneumothorax is identified. IMPRESSION: No active disease. Electronically Signed   By: Sebastian Ache M.D.   On: 10/16/2022 14:03    Procedures Procedures  {Document cardiac monitor, telemetry assessment procedure when appropriate:1}  Medications Ordered in ED Medications - No data to display  ED Course/ Medical Decision Making/ A&P                           Medical Decision Making Amount and/or Complexity of Data Reviewed Labs: ordered. Radiology: ordered.   ***  {Document critical care time when appropriate:1} {Document review of labs and clinical decision tools ie heart score, Chads2Vasc2 etc:1}  {Document your independent review of radiology images, and any outside records:1} {Document your discussion with family members, caretakers, and with consultants:1} {Document social determinants of health  affecting pt's care:1} {Document your decision making why or why not admission, treatments were needed:1} Final Clinical Impression(s) / ED Diagnoses Final diagnoses:  None    Rx / DC Orders ED Discharge Orders     None

## 2022-10-17 LAB — RESP PANEL BY RT-PCR (RSV, FLU A&B, COVID)  RVPGX2
Influenza A by PCR: NEGATIVE
Influenza B by PCR: NEGATIVE
Resp Syncytial Virus by PCR: POSITIVE — AB
SARS Coronavirus 2 by RT PCR: NEGATIVE

## 2022-10-17 MED ORDER — ALBUTEROL SULFATE (2.5 MG/3ML) 0.083% IN NEBU: 2.5000 mg | INHALATION_SOLUTION | Freq: Four times a day (QID) | RESPIRATORY_TRACT | 12 refills | Status: DC | PRN

## 2022-10-17 MED ORDER — PREDNISONE 10 MG PO TABS: 40.0000 mg | ORAL_TABLET | Freq: Every day | ORAL | 0 refills | Status: AC

## 2022-10-17 MED ORDER — ALBUTEROL SULFATE HFA 108 (90 BASE) MCG/ACT IN AERS: 2.0000 | INHALATION_SPRAY | Freq: Once | RESPIRATORY_TRACT | Status: DC

## 2022-10-20 ENCOUNTER — Ambulatory Visit (HOSPITAL_COMMUNITY)
Admission: EM | Admit: 2022-10-20 | Discharge: 2022-10-20 | Disposition: A | Discharge status: Home / Self Care | Payer: Medicaid Other

## 2022-10-20 ENCOUNTER — Encounter (HOSPITAL_COMMUNITY): Payer: Self-pay | Admitting: Emergency Medicine

## 2022-10-20 DIAGNOSIS — B338 Other specified viral diseases: Secondary | ICD-10-CM | POA: Diagnosis not present

## 2022-10-20 NOTE — ED Triage Notes (Signed)
Pt reports body aches, mild cough and dizziness x 2 days. States she was seen at [facility] for chest pain on 12/11 and was dx with RSV on 12/12. Also requesting a work note

## 2022-10-20 NOTE — Discharge Instructions (Signed)
Work note is attached, please make sure to get rest and stay adequately hydrated at home.  You can take ibuprofen every 6 hours (400-600 mg) , do not take more than 2400 mg in a 24-hour day.  I advised that you do not take ibuprofen on an empty stomach, ibuprofen can cause GI problems such as GI bleeding.

## 2022-10-20 NOTE — ED Notes (Signed)
Charted opened at pt request to obtain extended work note. Unable to provide. She has been encouraged to f/u with her PCP.

## 2022-10-20 NOTE — ED Provider Notes (Signed)
[facility]    CSN: [id number] Arrival date & time: 10/20/22  1102      History   Chief Complaint Chief Complaint  Patient presents with   Generalized Body Aches   Cough   Dizziness    HPI Amanda Murillo is a 31 y.o. female.  Patient presents complaining of generalized body aches and nonproductive cough that have been ongoing for the past 2 days.  Patient reports some lightheadedness with inhalation at times .Patient reports that she was seen in the emergency department on 10/16/2022 and was diagnosed with RSV on 10/17/2022.  She feels as though the symptoms are started up due to the RSV.  Patient reports when she presented to the emergency department she was having some chest pain, she was treated for asthma exacerbation.  Patient requests a work note for her job, she states that they have been giving her difficulty and are now requesting a work note.  Patient states that she attempted to go to the emergency department to retrieve a work note and they were unable to provide this.  Patient states that she has used her inhaler at home which has provided her relief of symptoms in regards to her asthma.    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 Authorizing Provider  albuterol (PROVENTIL) (2.5 MG/3ML) 0.083% nebulizer solution Take 3 mLs (2.5 mg total) by nebulization every 6 (six) hours as needed for wheezing or shortness of breath. 10/17/22   Ernie Avena, MD  cephALEXin (KEFLEX) 500 MG capsule Take 1 capsule (500 mg total) by mouth 3 (three) times daily. 05/29/19   Wallis Bamberg, PA-C  fluticasone (FLONASE) 50 MCG/ACT nasal spray Place 2 sprays into both nostrils daily. 02/03/21   White, Elita Boone, NP  metroNIDAZOLE (FLAGYL) 500 MG tablet Take 1 tablet (500 mg total) by mouth 2 (two) times daily. 12/26/18   Eustace Moore, MD  naproxen (NAPROSYN) 500 MG tablet Take 1 tablet (500 mg total) by mouth 2 (two) times daily as needed for moderate pain. 07/03/22   Carlisle Beers, FNP  phenol (CHLORASEPTIC) 1.4 % LIQD Use as directed 1 spray in the mouth or throat as needed for throat irritation / pain. 02/03/21   Valinda Hoar, NP  potassium chloride SA (  KLOR-CON M) 20 MEQ tablet Take 1 tablet (20 mEq total) by mouth daily for 5 days. 10/16/22 10/21/22  Ernie Avena, MD  predniSONE (DELTASONE) 10 MG tablet Take 4 tablets (40 mg total) by mouth daily for 5 days. 10/17/22 10/22/22  Ernie Avena, MD    Family History Family History  Problem Relation Age of Onset   Stroke Mother    Anesthesia problems Neg Hx     Social History Social History   Tobacco Use   Smoking status: Never   Smokeless tobacco: Never  Substance Use Topics   Alcohol use: No   Drug use: No     Allergies   Patient has no known allergies.   Review of Systems Review of Systems  Constitutional:  Positive for activity change and fatigue. Negative for appetite change, chills and fever.  HENT: Negative.    Eyes: Negative.   Respiratory:  Positive for cough. Negative for chest tightness and shortness of breath.    Cardiovascular:  Negative for chest pain and palpitations.  Gastrointestinal: Negative.   Musculoskeletal:  Positive for myalgias.  Neurological:  Positive for light-headedness (With inhalation).     Physical Exam Triage Vital Signs ED Triage Vitals [10/20/22 1137]  Enc Vitals Group     BP 118/77     Pulse Rate 67     Resp 17     Temp 98.1 F (36.7 C)     Temp Source Oral     SpO2 100 %     Weight      Height      Head Circumference      Peak Flow      Pain Score 6     Pain Loc      Pain Edu?      Excl. in [place]?    No data found.  Updated Vital Signs BP 118/77 (BP Location: Left Arm)   Pulse 67   Temp 98.1 F (36.7 C) (Oral)   Resp 17   SpO2 100%   Physical Exam Vitals and nursing note reviewed.  Constitutional:      Appearance: Normal appearance. She is not ill-appearing.  Cardiovascular:     Rate and Rhythm: Normal rate and regular rhythm.     Heart sounds: Normal heart sounds, S1 normal and S2 normal.  Pulmonary:     Effort: Pulmonary effort is normal.     Breath sounds: Normal breath sounds and air entry. No decreased breath sounds, wheezing, rhonchi or rales.  Neurological:     Mental Status: She is alert.      UC Treatments / Results  Labs (all labs ordered are listed, but only abnormal results are displayed) Labs Reviewed - No data to display  EKG   Radiology No results found.  Procedures Procedures (including critical care time)  Medications Ordered in UC Medications - No data to display  Initial Impression / Assessment and Plan / UC Course  I have reviewed the triage vital signs and the nursing notes.  Pertinent labs & imaging results that were available during my care of the patient were reviewed by me and considered in my medical decision making (see chart for details).     Patient was evaluated due to previous RSV diagnoses. Patients physical exam was reassuring. Patient was given work note. Etiology of symptoms seem to be  related to previous RSV diagnoses. Patient was made aware to continue regimen prescribed by ED. Patient was made aware she could use ibuprofen for symptom management.  Patient was made aware of timeline for resolution of symptoms and when follow-up will be necessary.  Patient verbalized understanding of instructions. Final Clinical Impressions(s) / UC Diagnoses   Final diagnoses:  RSV infection     Discharge Instructions      Work note is attached, please make sure to get rest and stay adequately hydrated at home.  You can take ibuprofen every 6 hours (400-600 mg) , do not take more than 2400 mg in a 24-hour day.  I advised that you do not take ibuprofen on an empty stomach, ibuprofen can cause GI problems such as GI bleeding.      ED Prescriptions   None    PDMP not reviewed this encounter.   Flossie Dibble, NP 10/20/22 1652

## 2022-11-14 ENCOUNTER — Ambulatory Visit: Payer: Medicaid Other

## 2022-11-14 ENCOUNTER — Ambulatory Visit (HOSPITAL_COMMUNITY)
Admission: EM | Admit: 2022-11-14 | Discharge: 2022-11-14 | Disposition: A | Discharge status: Home / Self Care | Payer: Medicaid Other | Attending: Emergency Medicine | Admitting: Emergency Medicine

## 2022-11-14 ENCOUNTER — Encounter (HOSPITAL_COMMUNITY): Payer: Self-pay

## 2022-11-14 DIAGNOSIS — J4520 Mild intermittent asthma, uncomplicated: Secondary | ICD-10-CM

## 2022-11-14 DIAGNOSIS — J069 Acute upper respiratory infection, unspecified: Secondary | ICD-10-CM | POA: Diagnosis not present

## 2022-11-14 DIAGNOSIS — K5900 Constipation, unspecified: Secondary | ICD-10-CM

## 2022-11-14 MED ORDER — IBUPROFEN 800 MG PO TABS: 800.0000 mg | ORAL_TABLET | Freq: Three times a day (TID) | ORAL | 0 refills | Status: DC

## 2022-11-14 NOTE — ED Triage Notes (Signed)
Patient with c/o body aches and cough. Patient states that she has chest pain when coughing. Patient had a covid and flu test and they were negative.

## 2022-11-14 NOTE — ED Provider Notes (Signed)
[facility]    CSN: [id number] Arrival date & time: 11/14/22  1633     History   Chief Complaint Chief Complaint  Patient presents with   Cough    HPI Amanda Murillo is a 32 y.o. female.  Difficult history, poor historian  Patient with 4 day history of cough. Cough is improving. Has some body aches in the back and chest that started last night Had negative covid and flu test 2 days ago Has tried aleve which helps. Used OTC cough med No fevers  Had RSV 12/11, was prescribed prednisone but didn't take it  History of asthma, has nebulizer machine at home Denies shortness of breath Reports wheezing ?on and off  Additionally has been straining with BM for a few days. Reports hard stool, little pellets. Some abdominal discomfort. Last BM was today. Reports eating well and drinking lots of fluids No nausea or vomitng.  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 Authorizing Provider  ibuprofen (ADVIL) 800 MG tablet Take 1 tablet (800 mg total) by mouth 3 (three) times daily. 11/14/22  Yes Marquiz Sotelo, Lurena Joiner, PA-C  albuterol (PROVENTIL) (2.5 MG/3ML) 0.083% nebulizer solution Take 3 mLs (2.5 mg total) by nebulization every 6 (six) hours as needed for wheezing or shortness of breath. 10/17/22   Ernie Avena, MD     Family History Family History  Problem Relation Age of Onset   Stroke Mother    Anesthesia problems Neg Hx     Social History Social History   Tobacco Use   Smoking status: Never   Smokeless tobacco: Never  Substance Use Topics   Alcohol use: No   Drug use: No     Allergies   Patient has no known allergies.   Review of Systems Review of Systems  Respiratory:  Positive for cough.    Per HPI  Physical Exam Triage Vital Signs ED Triage Vitals  Enc Vitals Group     BP 11/14/22 1744 (!) 89/65     Pulse Rate 11/14/22 1744 73     Resp 11/14/22 1744 16     Temp 11/14/22 1742 98.3 F (36.8 C)     Temp Source 11/14/22 1742 Oral     SpO2 11/14/22 1744 98 %     Weight --      Height --      Head Circumference --      Peak Flow --      Pain Score 11/14/22 1743 6  Pain Loc --      Pain Edu? --      Excl. in [place]? --    No data found.  Updated Vital Signs BP 96/63 (BP Location: Left Arm)   Pulse 73   Temp 98.3 F (36.8 C) (Oral)   Resp 16   LMP 11/06/2022 (Approximate)   SpO2 98%    Physical Exam Vitals and nursing note reviewed.  Constitutional:      General: She is not in acute distress.    Appearance: Normal appearance.     Comments: Speaks in full sentences without respiratory distress  HENT:     Nose: No congestion or rhinorrhea.     Mouth/Throat:     Mouth: Mucous membranes are moist.     Pharynx: Oropharynx is clear. No posterior oropharyngeal erythema.  Eyes:     Conjunctiva/sclera: Conjunctivae normal.  Cardiovascular:     Rate and Rhythm: Normal rate and regular rhythm.     Pulses: Normal pulses.     Heart sounds: Normal heart sounds.  Pulmonary:     Effort: Pulmonary effort is normal.     Breath sounds: Normal breath sounds.     Comments: Occasional cough in clinic. Lungs are clear throughout without wheezing Abdominal:     General: There is no distension.     Tenderness: There is no abdominal tenderness. There is no right CVA  tenderness, left CVA tenderness or guarding.  Musculoskeletal:        General: No tenderness. Normal range of motion.     Cervical back: Normal range of motion.     Comments: Non tender back and neck  Lymphadenopathy:     Cervical: No cervical adenopathy.  Skin:    General: Skin is warm and dry.  Neurological:     Mental Status: She is alert and oriented to person, place, and time.     UC Treatments / Results  Labs (all labs ordered are listed, but only abnormal results are displayed) Labs Reviewed - No data to display  EKG   Radiology No results found.  Procedures Procedures   Medications Ordered in UC Medications - No data to display  Initial Impression / Assessment and Plan / UC Course  I have reviewed the triage vital signs and the nursing notes.  Pertinent labs & imaging results that were available during my care of the patient were reviewed by me and considered in my medical decision making (see chart for details).  Afebrile here, well appearing, no respiratory distress. I asked patient what is bothering her most, she reports the body aches (back and chest pain). This may be related to inflammation and cough, especially with recent RSV infection a few weeks ago. Advised to use ibuprofen 3 times daily for the next few days.  I recommend she take the prednisone course prescribed to her previously, she picked it up but never started it.  40 mg once a day for 5 days. Additionally recommend using the nebulizer at home every 6 hours to open up the chest, reduce inflammation, help with cough.  No concern for asthma exacerbation at this time but underlying asthma is likely contributing factor. Discussed use of miralax for constipation. No red flags. Return precautions discussed. Patient agrees to plan  Final Clinical Impressions(s) / UC Diagnoses   Final diagnoses:  Viral URI with cough  Mild intermittent asthma without complication  Constipation, unspecified constipation  type     Discharge Instructions      You can  take the ibuprofen every 6 hours for the next 4-5 days. This can help with pain and inflammation.  Please use nebulizer every 6 hours for the next 3-4 days, then continue as needed.  I recommend to use the prednisone prescribed at your previous visit. Please start this tomorrow and take daily for 5 days. Finish the full 5 days.   Drink lots of fluids! I recommend stool softener for constipation. Miralax is a good choice.    ED Prescriptions     Medication Sig Dispense Auth. Provider   ibuprofen (ADVIL) 800 MG tablet Take 1 tablet (800 mg total) by mouth 3 (three) times daily. 21 tablet Lachlan Mckim, Lurena Joiner, PA-C      PDMP not reviewed this encounter.   Marlow Baars, [place] 11/14/22 1900

## 2022-11-14 NOTE — Discharge Instructions (Addendum)
You can take the ibuprofen every 6 hours for the next 4-5 days. This can help with pain and inflammation.  Please use nebulizer every 6 hours for the next 3-4 days, then continue as needed.  I recommend to use the prednisone prescribed at your previous visit. Please start this tomorrow and take daily for 5 days. Finish the full 5 days.   Drink lots of fluids! I recommend stool softener for constipation. Miralax is a good choice.

## 2023-01-04 ENCOUNTER — Ambulatory Visit (HOSPITAL_COMMUNITY)
Admission: RE | Admit: 2023-01-04 | Discharge: 2023-01-04 | Disposition: A | Discharge status: Home / Self Care | Payer: Medicaid Other | Source: Ambulatory Visit | Attending: Family Medicine | Admitting: Family Medicine

## 2023-01-04 ENCOUNTER — Encounter (HOSPITAL_COMMUNITY): Payer: Self-pay

## 2023-01-04 VITALS — BP 109/71 | HR 65 | Temp 99.0°F | Resp 18

## 2023-01-04 DIAGNOSIS — J020 Streptococcal pharyngitis: Secondary | ICD-10-CM | POA: Diagnosis not present

## 2023-01-04 MED ORDER — AMOXICILLIN 500 MG PO CAPS: 500.0000 mg | ORAL_CAPSULE | Freq: Three times a day (TID) | ORAL | 0 refills | Status: DC

## 2023-01-04 NOTE — ED Triage Notes (Signed)
Pt states starting yesterday she has bilateral ear pressure/pain, swollen glands, and sire throat. She hasn't taken any meds. Denies any fever.

## 2023-01-04 NOTE — ED Provider Notes (Signed)
[place]    CSN: [id number] Arrival date & time: 01/04/23  1312      History   Chief Complaint Chief Complaint  Patient presents with   Sore Throat    Glands are. Swollen - Entered by patient   Otalgia    HPI Amanda Murillo is a 32 y.o. female.   She woke up yesterday with sore throat.  Very painful to swallow;  then with some ear pain, neck/glands feel swollen and tender.  No known fevers.  No cough.  Daughter had strep throat last week.  No otc meds taken.       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 Authorizing Provider  albuterol (PROVENTIL) (2.5 MG/3ML) 0.083% nebulizer solution Take 3 mLs (2.5 mg total) by nebulization every 6 (six) hours as needed for wheezing or shortness of breath. 10/17/22   Regan Lemming, MD  ibuprofen (ADVIL) 800 MG tablet Take 1 tablet (800 mg total) by mouth 3 (three) times daily. 11/14/22   Rising, Wells Guiles, PA-C    Family History Family History  Problem Relation Age of Onset   Stroke Mother    Anesthesia problems Neg Hx     Social History Social History   Tobacco Use   Smoking status: Never   Smokeless tobacco: Never  Vaping Use   Vaping Use: Never used  Substance Use Topics   Alcohol use: No   Drug use: No     Allergies   Patient has no known  allergies.   Review of Systems Review of Systems  Constitutional:  Negative for chills and fever.  HENT:  Positive for ear pain and sore throat.   Respiratory: Negative.    Cardiovascular: Negative.   Gastrointestinal: Negative.   Genitourinary: Negative.   Musculoskeletal: Negative.   Psychiatric/Behavioral: Negative.       Physical Exam Triage Vital Signs ED Triage Vitals  Enc Vitals Group     BP 01/04/23 1333 109/71     Pulse Rate 01/04/23 1333 65     Resp 01/04/23 1333 18     Temp 01/04/23 1333 99 F (37.2 C)     Temp Source 01/04/23 1333 Oral     SpO2 01/04/23 1333 99 %     Weight --      Height --      Head Circumference --      Peak Flow --      Pain Score 01/04/23 1332 6     Pain Loc --  Pain Edu? --      Excl. in [place]? --    No data found.  Updated Vital Signs BP 109/71 (BP Location: Right Arm)   Pulse 65   Temp 99 F (37.2 C) (Oral)   Resp 18   LMP 01/01/2023 (Exact Date)   SpO2 99%   Visual Acuity Right Eye Distance:   Left Eye Distance:   Bilateral Distance:    Right Eye Near:   Left Eye Near:    Bilateral Near:     Physical Exam Constitutional:      Appearance: She is well-developed.  HENT:     Right Ear: A middle ear effusion is present.     Mouth/Throat:     Mouth: Mucous membranes are moist.     Pharynx: Oropharyngeal exudate and posterior oropharyngeal erythema present.     Tonsils: Tonsillar exudate present. 2+ on the right. 2+ on the left.  Cardiovascular:     Rate and Rhythm: Normal rate and regular rhythm.     Heart sounds: Normal heart sounds.  Pulmonary:     Effort: Pulmonary effort is normal.  Musculoskeletal:     Cervical back: Normal range of motion and neck supple.  Lymphadenopathy:     Cervical: Cervical adenopathy present.  Neurological:     General: No focal deficit present.     Mental Status: She is alert.  Psychiatric:        Mood and Affect: Mood normal.      UC Treatments / Results  Labs (all labs  ordered are listed, but only abnormal results are displayed) Labs Reviewed - No data to display  EKG   Radiology No results found.  Procedures Procedures (including critical care time)  Medications Ordered in UC Medications - No data to display  Initial Impression / Assessment and Plan / UC Course  I have reviewed the triage vital signs and the nursing notes.  Pertinent labs & imaging results that were available during my care of the patient were reviewed by me and considered in my medical decision making (see chart for details).    Final Clinical Impressions(s) / UC Diagnoses   Final diagnoses:  Strep pharyngitis     Discharge Instructions      You were diagnosed with strep throat today.  I have sent out amoxil to take three time/day x 7 days. You may use tylenol/motrin for pain.  Use salt water gargles as well.  Get a new toothbrush and clean/launder bedding and pillow cases.     ED Prescriptions     Medication Sig Dispense Auth. Provider   amoxicillin (AMOXIL) 500 MG capsule Take 1 capsule (500 mg total) by mouth 3 (three) times daily. 21 capsule Rondel Oh, MD      PDMP not reviewed this encounter.   Rondel Oh, MD 01/04/23 1406

## 2023-01-04 NOTE — Discharge Instructions (Signed)
You were diagnosed with strep throat today.  I have sent out amoxil to take three time/day x 7 days. You may use tylenol/motrin for pain.  Use salt water gargles as well.  Get a new toothbrush and clean/launder bedding and pillow cases.

## 2023-01-10 ENCOUNTER — Ambulatory Visit (HOSPITAL_COMMUNITY)
Admission: EM | Admit: 2023-01-10 | Discharge: 2023-01-10 | Disposition: A | Discharge status: Home / Self Care | Payer: Medicaid Other | Attending: Family Medicine | Admitting: Family Medicine

## 2023-01-10 ENCOUNTER — Encounter (HOSPITAL_COMMUNITY): Payer: Self-pay | Admitting: Emergency Medicine

## 2023-01-10 DIAGNOSIS — S00451A Superficial foreign body of right ear, initial encounter: Secondary | ICD-10-CM | POA: Diagnosis not present

## 2023-01-10 DIAGNOSIS — T161XXA Foreign body in right ear, initial encounter: Secondary | ICD-10-CM

## 2023-01-10 NOTE — ED Provider Notes (Signed)
[place]    CSN: [id number] Arrival date & time: 01/10/23  1102      History   Chief Complaint No chief complaint on file.   HPI Amanda Murillo is a 32 y.o. female.   Patient presents to urgent care for removal of earring imbedded to the soft tissue/cartilage of the right ear. Patient recently go her 3rd hole pierced in both ear lobes (1ish week ago) and states she has been twisting and cleaning them appropriately as directed by the piercing place. Last night, she cleaned both new piercings and states she did notice initially that the piercing to the right ear is more medial than the piercing to the left ear. She woke up this morning and found the new earring to the right ear imbedded to the soft tissue. She states the area is very painful. She is currently taking amoxicillin antibiotic for strep throat (on day 4-5 out of 10), however reports she has seen some purulent drainage to the site. Earring backing is still in place.      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 Authorizing Provider  albuterol (PROVENTIL) (2.5 MG/3ML) 0.083% nebulizer solution Take 3 mLs (2.5 mg total) by nebulization every 6 (six) hours as needed for wheezing or shortness of breath. 10/17/22   Regan Lemming, MD  amoxicillin (AMOXIL) 500 MG  capsule Take 1 capsule (500 mg total) by mouth 3 (three) times daily. 01/04/23   Piontek, Junie Panning, MD  ibuprofen (ADVIL) 800 MG tablet Take 1 tablet (800 mg total) by mouth 3 (three) times daily. 11/14/22   Rising, Wells Guiles, PA-C    Family History Family History  Problem Relation Age of Onset   Stroke Mother    Anesthesia problems Neg Hx     Social History Social History   Tobacco Use   Smoking status: Never   Smokeless tobacco: Never  Vaping Use   Vaping Use: Never used  Substance Use Topics   Alcohol use: No   Drug use: No     Allergies   Patient has no known allergies.   Review of Systems Review of Systems Per HPI  Physical Exam Triage Vital Signs ED Triage Vitals  Enc Vitals Group     BP 01/10/23 1115 103/68     Pulse Rate 01/10/23 1115 62     Resp 01/10/23 1115 15     Temp 01/10/23 1115 98.2 F (36.8 C)     Temp Source 01/10/23 1115 Oral  SpO2 01/10/23 1115 97 %     Weight --      Height --      Head Circumference --      Peak Flow --      Pain Score 01/10/23 1114 8     Pain Loc --      Pain Edu? --      Excl. in [place]? --    No data found.  Updated Vital Signs BP 103/68 (BP Location: Right Arm)   Pulse 62   Temp 98.2 F (36.8 C) (Oral)   Resp 15   LMP 01/01/2023 (Exact Date)   SpO2 97%   Visual Acuity Right Eye Distance:   Left Eye Distance:   Bilateral Distance:    Right Eye Near:   Left Eye Near:    Bilateral Near:     Physical Exam Vitals and nursing note reviewed.  Constitutional:      Appearance: She is not ill-appearing or toxic-appearing.  HENT:     Head: Normocephalic and atraumatic.     Right Ear: Hearing, tympanic membrane and ear canal normal.     Left Ear: Hearing, tympanic membrane, ear canal and external ear normal.     Ears:      Nose: Nose normal.     Mouth/Throat:     Lips: Pink.  Eyes:     General: Lids are normal. Vision grossly intact. Gaze aligned appropriately.     Extraocular Movements: Extraocular movements  intact.     Conjunctiva/sclera: Conjunctivae normal.  Cardiovascular:     Rate and Rhythm: Normal rate and regular rhythm.     Heart sounds: Normal heart sounds, S1 normal and S2 normal.  Pulmonary:     Effort: Pulmonary effort is normal. No respiratory distress.     Breath sounds: Normal breath sounds and air entry.  Musculoskeletal:     Cervical back: Neck supple.  Skin:    General: Skin is warm and dry.     Capillary Refill: Capillary refill takes less than 2 seconds.     Findings: No rash.  Neurological:     General: No focal deficit present.     Mental Status: She is alert and oriented to person, place, and time. Mental status is at baseline.     Cranial Nerves: No dysarthria or facial asymmetry.  Psychiatric:        Mood and Affect: Mood normal.        Speech: Speech normal.        Behavior: Behavior normal.        Thought Content: Thought content normal.        Judgment: Judgment normal.      UC Treatments / Results  Labs (all labs ordered are listed, but only abnormal results are displayed) Labs Reviewed - No data to display  EKG   Radiology No results found.  Procedures Foreign Body Removal  Date/Time: 01/10/2023 12:13 PM  Performed by: Talbot Grumbling, FNP Authorized by: Talbot Grumbling, FNP   Consent:    Consent obtained:  Verbal   Consent given by:  Patient   Risks, benefits, and alternatives were discussed: yes     Risks discussed:  Bleeding, incomplete removal, nerve damage, infection, pain, poor cosmetic result and worsening of condition   Alternatives discussed:  No treatment Universal protocol:    Patient identity confirmed:  Verbally with patient Location:    Location:  Ear   Ear location:  R ear   Depth:  Subcutaneous   Tendon involvement:  None Pre-procedure details:    Imaging:  None   Neurovascular status: intact   Anesthesia:    Anesthesia method:  Nerve block   Block location:  Right inferior ear   Block needle gauge:   25 G   Block anesthetic:  Lidocaine 1% w/o epi   Block injection procedure:  Anatomic landmarks identified, anatomic landmarks palpated, negative aspiration for blood, introduced needle and incremental injection   Block outcome:  Anesthesia achieved Procedure type:    Procedure complexity:  Simple Procedure details:    Incision length:  47mm   Localization method:  Visualized   Dissection of underlying tissues: no     Removal mechanism:  Hemostat   Foreign bodies recovered:  1   Description:  1 earring with post   Intact foreign body removal: yes   Post-procedure details:    Neurovascular status: intact     Confirmation:  No additional foreign bodies on visualization   Skin closure:  None   Dressing:  Non-adherent dressing   Procedure completion:  Tolerated well, no immediate complications  (including critical care time)  Medications Ordered in UC Medications - No data to display  Initial Impression / Assessment and Plan / UC Course  I have reviewed the triage vital signs and the nursing notes.  Pertinent labs & imaging results that were available during my care of the patient were reviewed by me and considered in my medical decision making (see chart for details).   1. Acute foreign body of right earlobe Earring removed from right earlobe, see procedure note above for details. Patient tolerated procedure well. She is already on amoxicillin for strep throat, therefore no additional antibiotic coverage is needed to treat infection. Advised to finish this antibiotic as prescribed. She is to allow the area to fully heal and is not to place any earrings to the site. Advised to continue to clean the site well with soap and water and return for any new or worsening signs of infection.  Discussed physical exam and available lab work findings in clinic with patient.  Counseled patient regarding appropriate use of medications and potential side effects for all medications recommended or  prescribed today. Discussed red flag signs and symptoms of worsening condition,when to call the PCP office, return to urgent care, and when to seek higher level of care in the emergency department. Patient verbalizes understanding and agreement with plan. All questions answered. Patient discharged in stable condition.    Final Clinical Impressions(s) / UC Diagnoses   Final diagnoses:  Acute foreign body of right earlobe, initial encounter     Discharge Instructions      Continue taking amoxicillin as prescribed for the next couple of days. This will cover bacteria infection to your ear as well.  If you notice any new or worsening signs of infection such as more drainage, redness, or worsening pain to the ear, please return to urgent care for reevaluation.  Otherwise, follow-up with primary care provider as needed.   Do not place an earring back in the ear hole where we removed it.    ED Prescriptions   None    PDMP not reviewed this encounter.   Talbot Grumbling, [place] 01/13/23 2218

## 2023-01-10 NOTE — ED Triage Notes (Signed)
Pt c/o pain and swelling at 3rd right ear ring site. Reports can't get a hold of ear ring to remove to due skin growing over ring. Took tylenol

## 2023-01-10 NOTE — Discharge Instructions (Addendum)
Continue taking amoxicillin as prescribed for the next couple of days. This will cover bacteria infection to your ear as well.  If you notice any new or worsening signs of infection such as more drainage, redness, or worsening pain to the ear, please return to urgent care for reevaluation.  Otherwise, follow-up with primary care provider as needed.   Do not place an earring back in the ear hole where we removed it.

## 2023-02-08 ENCOUNTER — Ambulatory Visit
Admission: RE | Admit: 2023-02-08 | Discharge: 2023-02-08 | Disposition: A | Discharge status: Home / Self Care | Payer: Medicaid Other | Source: Ambulatory Visit | Attending: Urgent Care | Admitting: Urgent Care

## 2023-02-08 VITALS — BP 101/57 | HR 64 | Temp 98.1°F | Resp 16

## 2023-02-08 DIAGNOSIS — R519 Headache, unspecified: Secondary | ICD-10-CM | POA: Diagnosis present

## 2023-02-08 DIAGNOSIS — Z1152 Encounter for screening for COVID-19: Secondary | ICD-10-CM | POA: Insufficient documentation

## 2023-02-08 DIAGNOSIS — B349 Viral infection, unspecified: Secondary | ICD-10-CM | POA: Insufficient documentation

## 2023-02-08 DIAGNOSIS — J4530 Mild persistent asthma, uncomplicated: Secondary | ICD-10-CM | POA: Insufficient documentation

## 2023-02-08 LAB — POCT INFLUENZA A/B
Influenza A, POC: NEGATIVE
Influenza B, POC: NEGATIVE

## 2023-02-08 MED ORDER — PREDNISONE 20 MG PO TABS: ORAL_TABLET | ORAL | 0 refills | Status: DC

## 2023-02-08 MED ORDER — BENZONATATE 100 MG PO CAPS: 100.0000 mg | ORAL_CAPSULE | Freq: Three times a day (TID) | ORAL | 0 refills | Status: DC | PRN

## 2023-02-08 MED ORDER — ALBUTEROL SULFATE HFA 108 (90 BASE) MCG/ACT IN AERS: 1.0000 | INHALATION_SPRAY | Freq: Four times a day (QID) | RESPIRATORY_TRACT | 0 refills | Status: AC | PRN

## 2023-02-08 NOTE — Discharge Instructions (Signed)
We will notify you of your test results as they arrive and may take between about 24 hours.  I encourage you to sign up for MyChart if you have not already done so as this can be the easiest way for us to communicate results to you online or through a phone app.  Generally, we only contact you if it is a positive test result.  In the meantime, if you develop worsening symptoms including fever, chest pain, shortness of breath despite our current treatment plan then please report to the emergency room as this may be a sign of worsening status from possible viral infection.  Otherwise, we will manage this as a viral syndrome. For sore throat or cough try using a honey-based tea. Use 3 teaspoons of honey with juice squeezed from half lemon. Place shaved pieces of ginger into 1/2-1 cup of water and warm over stove top. Then mix the ingredients and repeat every 4 hours as needed. Please take Tylenol 500mg-650mg every 6 hours for aches and pains, fevers. Hydrate very well with at least 2 liters of water. Eat light meals such as soups to replenish electrolytes and soft fruits, veggies. Start an antihistamine like Zyrtec (10mg daily) for postnasal drainage, sinus congestion.  You can take this together with prednisone and albuterol.  Use the cough medications as needed.  

## 2023-02-08 NOTE — ED Triage Notes (Signed)
Pt c/o body aches, nausea started yesterday-NAD-steady gait

## 2023-02-08 NOTE — ED Provider Notes (Signed)
[facility]  Note:  This document was prepared using Systems analyst and may include unintentional dictation errors.  MRN: [id number] DOB: 01-01-1991  Subjective:   Amanda Murillo is a 32 y.o. female presenting for 1 day history of acute onset body pains, nausea, chest pain, headaches.  No runny or stuffy nose, sore throat, cough, shortness of breath or wheezing.  Patient had RSV 10/2022.  Has a history of asthma.  Has also had bronchitis.  Would like to be checked for flu and COVID.  No smoking.  No current facility-administered medications for this encounter.  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, currently pregnant      Past Surgical History:  Procedure Laterality Date   NO PAST SURGERIES     THERAPEUTIC ABORTION      Family History  Problem Relation Age of Onset   Stroke Mother    Anesthesia problems Neg Hx     Social History   Tobacco Use   Smoking status: Never   Smokeless tobacco: Never  Vaping Use   Vaping Use: Never used  Substance Use Topics   Alcohol use: No   Drug use: No    ROS   Objective:   Vitals: BP (!) 101/57 (BP Location: Left Arm)   Pulse 64   Temp 98.1 F (36.7 C) (Oral)   Resp 16   LMP 01/29/2023   SpO2 98%   Physical Exam Constitutional:      General: She is not in acute distress.    Appearance: Normal  appearance. She is well-developed and normal weight. She is not ill-appearing, toxic-appearing or diaphoretic.  HENT:     Head: Normocephalic and atraumatic.     Right Ear: Tympanic membrane, ear canal and external ear normal. No drainage or tenderness. No middle ear effusion. There is no impacted cerumen. Tympanic membrane is not erythematous or bulging.     Left Ear: Tympanic membrane, ear canal and external ear normal. No drainage or tenderness.  No middle ear effusion. There is no impacted cerumen. Tympanic membrane is not erythematous or bulging.     Nose: Nose normal. No congestion or rhinorrhea.     Mouth/Throat:     Mouth: Mucous membranes are moist. No oral lesions.     Pharynx: No pharyngeal swelling, oropharyngeal exudate, posterior oropharyngeal erythema or uvula swelling.     Tonsils: No tonsillar exudate or tonsillar abscesses.  Eyes:     General: No scleral icterus.       Right eye: No discharge.        Left eye: No discharge.     Extraocular Movements: Extraocular movements intact.     Right eye: Normal extraocular motion.     Left eye: Normal extraocular motion.     Conjunctiva/sclera: Conjunctivae normal.  Cardiovascular:  Rate and Rhythm: Normal rate and regular rhythm.     Heart sounds: Normal heart sounds. No murmur heard.    No friction rub. No gallop.  Pulmonary:     Effort: Pulmonary effort is normal. No respiratory distress.     Breath sounds: No stridor. No wheezing, rhonchi or rales.  Chest:     Chest wall: No tenderness.  Musculoskeletal:     Cervical back: Normal range of motion and neck supple.  Lymphadenopathy:     Cervical: No cervical adenopathy.  Skin:    General: Skin is warm and dry.  Neurological:     General: No focal deficit present.     Mental Status: She is alert and oriented to person, place, and time.     Cranial Nerves: No cranial nerve deficit.     Motor: No weakness.     Coordination: Coordination normal.     Gait: Gait normal.   Psychiatric:        Mood and Affect: Mood normal.        Behavior: Behavior normal.     Results for orders placed or performed during the hospital encounter of 02/08/23 (from the past 24 hour(s))  POCT Influenza A/B     Status: None   Collection Time: 02/08/23 11:40 AM  Result Value Ref Range   Influenza A, POC Negative Negative   Influenza B, POC Negative Negative    Assessment and Plan :   PDMP not reviewed this encounter.  1. Acute viral syndrome   2. Mild persistent asthma without complication     In the context of her asthma and chest symptoms recommended an oral prednisone course.  Refilled an albuterol inhaler.  Deferred imaging given clear cardiopulmonary exam, hemodynamically stable vital signs. Will manage for viral illness such as viral URI, viral syndrome, viral rhinitis, COVID-19. Recommended supportive care. Offered scripts for symptomatic relief. Testing is pending. Counseled patient on potential for adverse effects with medications prescribed/recommended today, ER and return-to-clinic precautions discussed, patient verbalized understanding.   Patient should undergo Paxlovid treatment should she test positive for COVID-19.   Jaynee Eagles, [place] 02/08/23 1151

## 2023-02-09 LAB — SARS CORONAVIRUS 2 (TAT 6-24 HRS): SARS Coronavirus 2: NEGATIVE

## 2023-06-25 ENCOUNTER — Ambulatory Visit: Payer: Self-pay

## 2023-06-27 ENCOUNTER — Encounter (HOSPITAL_COMMUNITY): Payer: Self-pay

## 2023-06-27 ENCOUNTER — Ambulatory Visit (HOSPITAL_COMMUNITY)
Admission: RE | Admit: 2023-06-27 | Discharge: 2023-06-27 | Disposition: A | Discharge status: Home / Self Care | Payer: Medicaid Other | Source: Ambulatory Visit | Attending: Internal Medicine | Admitting: Internal Medicine

## 2023-06-27 VITALS — BP 102/65 | HR 63 | Temp 98.3°F | Resp 16

## 2023-06-27 DIAGNOSIS — U071 COVID-19: Secondary | ICD-10-CM | POA: Diagnosis not present

## 2023-06-27 DIAGNOSIS — J4521 Mild intermittent asthma with (acute) exacerbation: Secondary | ICD-10-CM | POA: Diagnosis not present

## 2023-06-27 MED ORDER — PAXLOVID (300/100) 20 X 150 MG & 10 X 100MG PO TBPK: 3.0000 | ORAL_TABLET | Freq: Two times a day (BID) | ORAL | 0 refills | Status: AC

## 2023-06-27 MED ORDER — BENZONATATE 100 MG PO CAPS: 100.0000 mg | ORAL_CAPSULE | Freq: Three times a day (TID) | ORAL | 0 refills | Status: DC

## 2023-06-27 MED ORDER — PREDNISONE 20 MG PO TABS: 40.0000 mg | ORAL_TABLET | Freq: Every day | ORAL | 0 refills | Status: AC

## 2023-06-27 MED ORDER — ALBUTEROL SULFATE (2.5 MG/3ML) 0.083% IN NEBU: 2.5000 mg | INHALATION_SOLUTION | Freq: Four times a day (QID) | RESPIRATORY_TRACT | 0 refills | Status: DC | PRN

## 2023-06-27 NOTE — Discharge Instructions (Addendum)
Your symptoms are most likely due to a viral illness, which will improve on its own with rest and fluids. Wear a mask for 5 days of symptoms. You may return to public within those 5 days as long as you do not have a fever. Wash hands frequently.  - Take prescribed medicines to help with symptoms: tessalon perles, prednisone as prescribed for asthma attack, paxlovid to prevent severe COVID-19 illness and help with symptoms - Use over the counter medicines to help with symptoms as discussed: Tylenol, guaifenesin (mucinex), zyrtec, etc - Two teaspoons of honey in warm water every 4-6 hours may help with throat pains - Humidifier in your room at night to help add water the air and soothe cough  If you develop any new or worsening symptoms or do not improve in the next 2 to 3 days, please return.  If your symptoms are severe, please go to the emergency room.  Follow-up with PCP as needed.

## 2023-06-27 NOTE — ED Provider Notes (Signed)
[facility]    CSN: [id number] Arrival date & time: 06/27/23  1150      History   Chief Complaint Chief Complaint  Patient presents with   Cough    Test positive for Covid - Entered by patient    HPI Amanda Murillo is a 32 y.o. female.   Patient presents to urgent care for evaluation of cough, congestion, body aches, and generalized headache that started 2 days ago on June 25, 2023. She took an at home COVID-19 test this morning and it was positive. History of asthma, using albuterol inhaler with some relief of cough and shortness of breath. No chest pain, N/V/D, abdominal pain, or dizziness. No fever/chills, sore throat, rash, or sick contacts with similar symptoms. No recent antibiotic/steroid use.  Never smoker, denies drug use. Using ibuprofen with some relief of the headache.    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 Authorizing Provider  benzonatate (TESSALON) 100 MG capsule Take 1 capsule (100 mg total) by mouth every 8 (eight) hours. 06/27/23  Yes Keron Koffman, Donavan Burnet, FNP  nirmatrelvir & ritonavir (PAXLOVID, 300/100,) 20 x 150 MG & 10 x 100MG  TBPK Take 3 tablets by mouth 2 (two) times daily for 5 days. 06/27/23 07/02/23 Yes Carlisle Beers, FNP  predniSONE (DELTASONE) 20 MG tablet Take 2  tablets (40 mg total) by mouth daily for 5 days. 06/27/23 07/02/23 Yes Carlisle Beers, FNP  albuterol (PROVENTIL) (2.5 MG/3ML) 0.083% nebulizer solution Take 3 mLs (2.5 mg total) by nebulization every 6 (six) hours as needed for wheezing or shortness of breath. 06/27/23   Carlisle Beers, FNP  albuterol (VENTOLIN HFA) 108 (90 Base) MCG/ACT inhaler Inhale 1-2 puffs into the lungs every 6 (six) hours as needed for wheezing or shortness of breath. 02/08/23   Wallis Bamberg, PA-C  amoxicillin (AMOXIL) 500 MG capsule Take 1 capsule (500 mg total) by mouth 3 (three) times daily. 01/04/23   Piontek, Denny Peon, MD  ibuprofen (ADVIL) 800 MG tablet Take 1 tablet (800 mg total) by mouth 3 (three) times daily. 11/14/22   Rising, Lurena Joiner PA-C    Family History Family History  Problem Relation Age of Onset   Stroke Mother    Anesthesia problems Neg Hx     Social History Social History   Tobacco Use   Smoking status: Never   Smokeless tobacco: Never  Vaping Use   Vaping status: Never Used  Substance Use Topics   Alcohol use: No  Drug use: No     Allergies   Patient has no known allergies.   Review of Systems Review of Systems  Respiratory:  Positive for cough.   Per HPI   Physical Exam Triage Vital Signs ED Triage Vitals  Encounter Vitals Group     BP 06/27/23 1246 102/65     Systolic BP Percentile --      Diastolic BP Percentile --      Pulse Rate 06/27/23 1246 63     Resp 06/27/23 1246 16     Temp 06/27/23 1246 98.3 F (36.8 C)     Temp Source 06/27/23 1246 Oral     SpO2 06/27/23 1246 99 %     Weight --      Height --      Head Circumference --      Peak Flow --      Pain Score 06/27/23 1245 8     Pain Loc --      Pain Education --      Exclude from Growth Chart --    No data found.  Updated Vital Signs BP 102/65 (BP Location: Left Arm)   Pulse 63   Temp 98.3 F (36.8 C) (Oral)   Resp 16   LMP 06/18/2023 (Exact Date)   SpO2 99%   Visual Acuity Right Eye  Distance:   Left Eye Distance:   Bilateral Distance:    Right Eye Near:   Left Eye Near:    Bilateral Near:     Physical Exam Vitals and nursing note reviewed.  Constitutional:      Appearance: She is not ill-appearing or toxic-appearing.  HENT:     Head: Normocephalic and atraumatic.     Right Ear: Hearing, tympanic membrane, ear canal and external ear normal.     Left Ear: Hearing, tympanic membrane, ear canal and external ear normal.     Nose: Congestion present.     Mouth/Throat:     Lips: Pink.     Mouth: Mucous membranes are moist. No injury.     Tongue: No lesions. Tongue does not deviate from midline.     Palate: No mass and lesions.     Pharynx: Oropharynx is clear. Uvula midline. Posterior oropharyngeal erythema present. No pharyngeal swelling, oropharyngeal exudate or uvula swelling.     Tonsils: No tonsillar exudate or tonsillar abscesses.  Eyes:     General: Lids are normal. Vision grossly intact. Gaze aligned appropriately.     Extraocular Movements: Extraocular movements intact.     Conjunctiva/sclera: Conjunctivae normal.  Cardiovascular:     Rate and Rhythm: Normal rate and regular rhythm.     Heart sounds: Normal heart sounds, S1 normal and S2 normal.  Pulmonary:     Effort: Pulmonary effort is normal. No respiratory distress.     Breath sounds: Normal air entry. Wheezing (Faint expiratory wheeze to the right lower lung field, no other adventitious lung sounds) present. No rhonchi or rales.  Chest:     Chest wall: No tenderness.  Musculoskeletal:     Cervical back: Neck supple.  Lymphadenopathy:     Cervical: No cervical adenopathy.  Skin:    General: Skin is warm and dry.     Capillary Refill: Capillary refill takes less than 2 seconds.     Findings: No rash.  Neurological:     General: No focal deficit present.     Mental Status: She is alert and oriented to person, place, and time. Mental status  is at baseline.     Cranial Nerves: No dysarthria or  facial asymmetry.  Psychiatric:        Mood and Affect: Mood normal.        Speech: Speech normal.        Behavior: Behavior normal.        Thought Content: Thought content normal.        Judgment: Judgment normal.      UC Treatments / Results  Labs (all labs ordered are listed, but only abnormal results are displayed) Labs Reviewed - No data to display  EKG   Radiology No results found.  Procedures Procedures (including critical care time)  Medications Ordered in UC Medications - No data to display  Initial Impression / Assessment and Plan / UC Course  I have reviewed the triage vital signs and the nursing notes.  Pertinent labs & imaging results that were available during my care of the patient were reviewed by me and considered in my medical decision making (see chart for details).   1. COVID-19, mild intermittent asthma with exacerbation COVID-19 test at home positive. She is a candidate for antiviral, paxlovid sent to pharmacy (most recent GFR >60, 10/16/2022).  CDC guidelines regarding COVID-19 masking and quarantine discussed. Work excuse given.  Viral illness has triggered mild intermittent asthma exacerbation. Will treat with albuterol (nebulizer solution refill sent), prednisone burst 40mg  every day for 5 days, and tessalon perles as needed for cough. No NSAIDs with prednisone, advised to take with food.  Counseled patient on potential for adverse effects with medications prescribed/recommended today, strict ER and return-to-clinic precautions discussed, patient verbalized understanding.    Final Clinical Impressions(s) / UC Diagnoses   Final diagnoses:  COVID-19  Mild intermittent asthma with exacerbation     Discharge Instructions      Your symptoms are most likely due to a viral illness, which will improve on its own with rest and fluids. Wear a mask for 5 days of symptoms. You may return to public within those 5 days as long as you do not have a  fever. Wash hands frequently.  - Take prescribed medicines to help with symptoms: tessalon perles, prednisone as prescribed for asthma attack, paxlovid to prevent severe COVID-19 illness and help with symptoms - Use over the counter medicines to help with symptoms as discussed: Tylenol, guaifenesin (mucinex), zyrtec, etc - Two teaspoons of honey in warm water every 4-6 hours may help with throat pains - Humidifier in your room at night to help add water the air and soothe cough  If you develop any new or worsening symptoms or do not improve in the next 2 to 3 days, please return.  If your symptoms are severe, please go to the emergency room.  Follow-up with PCP as needed.      ED Prescriptions     Medication Sig Dispense Auth. Provider   benzonatate (TESSALON) 100 MG capsule Take 1 capsule (100 mg total) by mouth every 8 (eight) hours. 21 capsule Reita May M, FNP   predniSONE (DELTASONE) 20 MG tablet Take 2 tablets (40 mg total) by mouth daily for 5 days. 10 tablet Carlisle Beers, FNP   albuterol (PROVENTIL) (2.5 MG/3ML) 0.083% nebulizer solution Take 3 mLs (2.5 mg total) by nebulization every 6 (six) hours as needed for wheezing or shortness of breath. 75 mL Carlisle Beers, FNP   nirmatrelvir & ritonavir (PAXLOVID, 300/100,) 20 x 150 MG & 10 x 100MG  TBPK Take 3 tablets by  mouth 2 (two) times daily for 5 days. 30 tablet Carlisle Beers, FNP      PDMP not reviewed this encounter.   Carlisle Beers, [place] 06/27/23 2124

## 2023-06-27 NOTE — ED Triage Notes (Signed)
Patient here today with c/o cough, nasal drainage and congestion, SOB, and wheezing X 2 days. Patient states that she took a covid test at home and it came back positive. Has h/o asthma. Took IBU for her headache which seems to have helped a little. No sick contacts. No recent travel.

## 2024-12-10 ENCOUNTER — Encounter (HOSPITAL_COMMUNITY): Payer: Self-pay

## 2024-12-10 ENCOUNTER — Ambulatory Visit (HOSPITAL_COMMUNITY)
Admission: EM | Admit: 2024-12-10 | Discharge: 2024-12-10 | Disposition: A | Discharge status: Home / Self Care | Source: Home / Self Care | Attending: Family Medicine | Admitting: Family Medicine

## 2024-12-10 DIAGNOSIS — J4521 Mild intermittent asthma with (acute) exacerbation: Secondary | ICD-10-CM

## 2024-12-10 DIAGNOSIS — R52 Pain, unspecified: Secondary | ICD-10-CM | POA: Diagnosis not present

## 2024-12-10 DIAGNOSIS — Z87898 Personal history of other specified conditions: Secondary | ICD-10-CM | POA: Diagnosis not present

## 2024-12-10 DIAGNOSIS — R112 Nausea with vomiting, unspecified: Secondary | ICD-10-CM | POA: Diagnosis not present

## 2024-12-10 LAB — POCT INFLUENZA A/B
Influenza A, POC: NEGATIVE
Influenza B, POC: NEGATIVE

## 2024-12-10 LAB — POC SOFIA SARS ANTIGEN FIA: SARS Coronavirus 2 Ag: NEGATIVE

## 2024-12-10 MED ORDER — ONDANSETRON 4 MG PO TBDP: 4.0000 mg | ORAL_TABLET | Freq: Three times a day (TID) | ORAL | 0 refills | Status: AC | PRN

## 2024-12-10 MED ORDER — PROMETHAZINE-DM 6.25-15 MG/5ML PO SYRP: 5.0000 mL | ORAL_SOLUTION | Freq: Four times a day (QID) | ORAL | 0 refills | Status: AC | PRN

## 2024-12-10 MED ORDER — ACETAMINOPHEN 325 MG PO TABS
650.0000 mg | ORAL_TABLET | Freq: Once | ORAL | Status: AC
  Administered 2024-12-10: 650 mg via ORAL

## 2024-12-10 MED ORDER — ALBUTEROL SULFATE (2.5 MG/3ML) 0.083% IN NEBU: 2.5000 mg | INHALATION_SOLUTION | Freq: Four times a day (QID) | RESPIRATORY_TRACT | 0 refills | Status: AC | PRN

## 2024-12-10 MED ORDER — ACETAMINOPHEN 325 MG PO TABS
ORAL_TABLET | ORAL | Status: AC
  Filled 2024-12-10: qty 2

## 2024-12-10 NOTE — Medical Student Note (Signed)
 [employer], Site Note For educational purposes for Medical, PA and NP students only and not part of the legal medical record.   CSN: [id number] Arrival date & time: 12/10/24  0801      History   Chief Complaint Chief Complaint  Patient presents with   Cough   Emesis   Headache   Generalized Body Aches   Chills   Otalgia   Sore Throat    HPI Amanda Murillo is a 34 y.o. female with a hx of asthma.  She presents with 2 days of fever, chills, cough, body aches, HA, otalgia to the L ear. Her cough is productive with a clear sputum. She also has some associated ShOB for which she has used her albuterol  nebulizer 3 times with some relief. She has been taking an OTC pain reliever, but she does not know the name of the medication.   The history is provided by the patient.  Cough Associated symptoms: chills, ear pain, fever, headaches, myalgias, rhinorrhea and shortness of breath   Associated symptoms: no chest pain   Emesis Associated symptoms: chills, cough, fever, headaches and myalgias   Associated symptoms: no diarrhea   Headache Associated symptoms: cough, ear pain, fatigue, fever, myalgias, nausea and vomiting   Associated symptoms: no diarrhea   Otalgia Associated symptoms: cough, fever, headaches, rhinorrhea and vomiting   Associated symptoms: no diarrhea   Sore Throat Associated symptoms include headaches and shortness of breath. Pertinent negatives include no chest pain.    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 Authorizing Provider  albuterol  (PROVENTIL ) (2.5 MG/3ML) 0.083% nebulizer solution Take 3 mLs (2.5 mg total) by nebulization every 6 (six) hours as needed for wheezing or shortness of breath. 06/27/23   Enedelia Dorna HERO, FNP  albuterol  (VENTOLIN  HFA) 108 (90 Base) MCG/ACT inhaler Inhale 1-2 puffs into the lungs every 6 (six) hours as needed for wheezing or shortness of breath. 02/08/23   Christopher Savannah, PA-C  amoxicillin  (AMOXIL ) 500 MG capsule Take 1 capsule (500 mg total) by mouth 3 (three) times daily. Patient not taking: Reported on 12/10/2024 01/04/23   Darral Longs, MD  benzonatate  (TESSALON ) 100 MG capsule Take 1 capsule (100 mg total) by mouth every 8 (eight) hours. Patient not taking: Reported on 12/10/2024 06/27/23   Enedelia Dorna HERO, FNP  ibuprofen  (ADVIL ) 800 MG tablet Take 1 tablet (800 mg total) by mouth 3 (three) times daily. Patient not taking: Reported on 12/10/2024 11/14/22   Rising, [place],  PA-C    Family History Family History  Problem Relation Age of Onset   Stroke Mother    Anesthesia problems Neg Hx     Social History Social History[1]   Allergies   Patient has no known allergies.   Review of Systems Review of Systems  Constitutional:  Positive for chills, fatigue and fever.  HENT:  Positive for ear pain and rhinorrhea.   Respiratory:  Positive for cough and shortness of breath. Negative for chest tightness.   Cardiovascular:  Negative for chest pain.  Gastrointestinal:  Positive for nausea and vomiting. Negative for diarrhea.  Genitourinary:  Negative for difficulty urinating.  Musculoskeletal:  Positive for myalgias.  Neurological:  Positive for headaches.     Physical Exam Updated Vital Signs BP 92/62 (BP Location: Right Arm)    Pulse 91   Temp (!) 101.8 F (38.8 C) (Oral)   Resp 16   LMP 11/29/2024 (Approximate)   SpO2 92%   Physical Exam Vitals and nursing note reviewed.  Constitutional:      Appearance: Normal appearance. She is not ill-appearing.  HENT:     Right Ear: Tympanic membrane, ear canal and external ear normal.     Left Ear: Tympanic membrane, ear canal and external ear normal. Tenderness (tenderness to the canal) present.     Mouth/Throat:     Mouth: Mucous membranes are moist.     Pharynx: Oropharynx is clear.  Cardiovascular:     Rate and Rhythm: Normal rate and regular rhythm.     Heart sounds: Normal heart sounds. No murmur heard.    No friction rub. No gallop.  Pulmonary:     Effort: Pulmonary effort is normal.     Breath sounds: Normal breath sounds.  Abdominal:     General: Bowel sounds are normal.     Palpations: Abdomen is soft.     Tenderness: There is no abdominal tenderness. There is no guarding.  Musculoskeletal:     Cervical back: Neck supple.  Lymphadenopathy:     Cervical: No cervical adenopathy.  Skin:    General: Skin is warm and dry.  Neurological:     Mental Status: She is alert and oriented to person, place, and time.  Psychiatric:        Mood and Affect: Mood normal.        Behavior: Behavior normal.      ED Treatments / Results  Labs (all labs ordered are listed, but only abnormal results are displayed) Labs Reviewed  POC SOFIA SARS ANTIGEN FIA  POCT INFLUENZA A/B    EKG  Radiology No results found.  Procedures Procedures (including critical care time)  Medications Ordered in ED Medications  acetaminophen  (TYLENOL ) tablet 650 mg (650 mg Oral Given 12/10/24 9166)     Initial Impression / Assessment and Plan / ED Course  I have reviewed the triage vital signs and the nursing notes.  Pertinent labs & imaging results that were available during my care of the patient were reviewed by me and considered in my medical decision making (see chart  for details).    POC Flu and COVID19 are negative. EKG performed due to hx of QTc prolongation showing no acute changes. QTc  415  Viral Respiratory Infection  Symptoms consistent with viral illness. Counseled pt on alternating APAP and ibuprofen  every 3 hour prn for fever and pain. Discussed aggressive hydration. Start ondansetron  4 mg every 8 hrs prn for nausea. Promethazine  -DM 5 ml 4 times daily prn for  cough. Refill albuterol  2.5 mg nebulizer solution every 4 hours prn for wheezing and ShOB.   Red flag symptoms reviewed and return precautions given.    Final Clinical Impressions(s) / ED Diagnoses   Final diagnoses:  Body aches    New Prescriptions New Prescriptions   No medications on file        [1]  Social History Tobacco Use   Smoking status: Never   Smokeless tobacco: Never  Vaping Use   Vaping status: Never Used  Substance Use Topics   Alcohol use: No   Drug use: No   "

## 2024-12-10 NOTE — ED Triage Notes (Signed)
 Patient c/o emesis, headache, body aches, chills, a productive cough with clear sputum, left ear pain, and a sore throat since yesterday.  Patient states she took a [employer] Pain relief, an Albuterol  neb treatment.

## 2024-12-10 NOTE — ED Provider Notes (Signed)
 " [facility]   [id number] 12/10/24 Arrival Time: 0801  ASSESSMENT & PLAN:  1. Body aches   2. Mild intermittent asthma with acute exacerbation   3. History of prolonged Q-T interval on ECG   4. Nausea and vomiting, unspecified vomiting type    ECG: NSR.  Discussed typical duration of likely viral illness. Results for orders placed or performed during the hospital encounter of 12/10/24  POC Influenza A/B   Collection Time: 12/10/24  9:03 AM  Result Value Ref Range   Influenza A, POC Negative Negative   Influenza B, POC Negative Negative  POC SARS Coronavirus Ag   Collection Time: 12/10/24  9:25 AM  Result Value Ref Range   SARS Coronavirus 2 Ag Negative Negative   OTC symptom care as needed.  Meds ordered this encounter  Medications   acetaminophen  (TYLENOL ) tablet 650 mg   albuterol  (PROVENTIL ) (2.5 MG/3ML) 0.083% nebulizer solution    Sig: Take 3 mLs (2.5 mg total) by nebulization every 6 (six) hours as needed for wheezing or shortness of breath.    Dispense:  75 mL    Refill:  0   promethazine -dextromethorphan  (PROMETHAZINE -DM) 6.25-15 MG/5ML syrup    Sig: Take 5 mLs by mouth 4 (four) times daily as needed for cough.    Dispense:  118 mL    Refill:  0   ondansetron  (ZOFRAN -ODT) 4 MG disintegrating tablet    Sig: Take 1 tablet (4 mg total) by mouth every 8 (eight) hours as needed for nausea or vomiting.    Dispense:  15 tablet    Refill:  0     Follow-up Information     [facility].   Specialty: Urgent Care Why: If worsening or failing to improve as anticipated. Contact information: [address] [place]  [phone]                Reviewed expectations re: course of current medical issues. Questions answered. Outlined signs and symptoms indicating need for more acute intervention. Understanding verbalized. After Visit Summary given.   SUBJECTIVE: History from: Patient. Amanda Murillo is a 34 y.o. female. Patient c/o emesis, headache, body aches, chills, a productive cough with clear sputum, left ear pain, and a sore throat since yesterday.  Patient states she took a [employer] Pain relief, an Albuterol  neb treatment.  Patient's last menstrual period was 11/29/2024 (approximate). Denies: difficulty breathing here. Does feel she is wheezing at times. Normal PO intake without n/v/d.  OBJECTIVE:  Vitals:   12/10/24 0824  BP: 92/62  Pulse: 91  Resp: 16  Temp: (!) 101.8 F (38.8 C)  TempSrc: Oral  SpO2: 92%    General appearance: alert; no distress Eyes: PERRLA; EOMI; conjunctiva normal HENT: [place]; AT; with nasal congestion Neck: supple  Lungs: speaks full sentences without difficulty; unlabored; coughing Extremities: no edema Skin: warm and dry Neurologic: normal gait Psychological: alert and cooperative; normal mood and affect  Labs: Results for orders placed or performed during the hospital encounter of 12/10/24  POC Influenza A/B   Collection Time: 12/10/24  9:03 AM  Result Value Ref Range   Influenza A, POC Negative Negative   Influenza B, POC Negative Negative  POC SARS Coronavirus Ag   Collection Time: 12/10/24  9:25 AM  Result Value Ref Range   SARS Coronavirus 2 Ag Negative Negative   Labs Reviewed  POC SOFIA SARS ANTIGEN FIA  POCT INFLUENZA A/B    Imaging: No  results found.  Allergies[1]  Past Medical History:  Diagnosis Date   Asthma    Bronchitis    Trichomonas infection    Trisomy 18 in child of prior pregnancy, currently pregnant    Social History   Socioeconomic History   Marital status: Single    Spouse name: Not on file   Number of children: Not on file   Years of education: Not on file   Highest education level: Not on file  Occupational History   Not on file  Tobacco Use   Smoking status: Never   Smokeless tobacco: Never  Vaping Use   Vaping status: Never Used  Substance and Sexual Activity   Alcohol use: No    Drug use: No   Sexual activity: Not Currently    Birth control/protection: None  Other Topics Concern   Not on file  Social History Narrative   Not on file   Social Drivers of Health   Tobacco Use: Low Risk (12/10/2024)   Patient History    Smoking Tobacco Use: Never    Smokeless Tobacco Use: Never    Passive Exposure: Not on file  Financial Resource Strain: Not on file  Food Insecurity: Not on file  Transportation Needs: Not on file  Physical Activity: Not on file  Stress: Not on file  Social Connections: Not on file  Intimate Partner Violence: Not on file  Depression (EYV7-0): Not on file  Alcohol Screen: Not on file  Housing: Not on file  Utilities: Not on file  Health Literacy: Not on file   Family History  Problem Relation Age of Onset   Stroke Mother    Anesthesia problems Neg Hx    Past Surgical History:  Procedure Laterality Date   NO PAST SURGERIES     THERAPEUTIC ABORTION        [1] No Known Allergies    Rolinda Rogue, MD 12/10/24 1106  "
